# Patient Record
Sex: Female | Born: 1935 | Race: White | Hispanic: No | Smoking: Former smoker
Health system: Southern US, Community
[De-identification: ages and names within clinical notes are randomized; demographics above are authoritative.]

## PROBLEM LIST (undated history)

## (undated) DIAGNOSIS — K284 Chronic or unspecified gastrojejunal ulcer with hemorrhage: Secondary | ICD-10-CM

## (undated) DIAGNOSIS — I10 Essential (primary) hypertension: Secondary | ICD-10-CM

## (undated) DIAGNOSIS — K274 Chronic or unspecified peptic ulcer, site unspecified, with hemorrhage: Secondary | ICD-10-CM

## (undated) DIAGNOSIS — R51 Headache: Secondary | ICD-10-CM

## (undated) DIAGNOSIS — J189 Pneumonia, unspecified organism: Secondary | ICD-10-CM

## (undated) DIAGNOSIS — J45909 Unspecified asthma, uncomplicated: Secondary | ICD-10-CM

## (undated) DIAGNOSIS — Z9289 Personal history of other medical treatment: Secondary | ICD-10-CM

## (undated) DIAGNOSIS — G43909 Migraine, unspecified, not intractable, without status migrainosus: Secondary | ICD-10-CM

## (undated) DIAGNOSIS — K219 Gastro-esophageal reflux disease without esophagitis: Secondary | ICD-10-CM

## (undated) DIAGNOSIS — E119 Type 2 diabetes mellitus without complications: Secondary | ICD-10-CM

## (undated) DIAGNOSIS — M1990 Unspecified osteoarthritis, unspecified site: Secondary | ICD-10-CM

## (undated) DIAGNOSIS — E785 Hyperlipidemia, unspecified: Secondary | ICD-10-CM

## (undated) DIAGNOSIS — R519 Headache, unspecified: Secondary | ICD-10-CM

## (undated) DIAGNOSIS — Z8719 Personal history of other diseases of the digestive system: Secondary | ICD-10-CM

## (undated) HISTORY — PX: CATARACT EXTRACTION: SUR2

## (undated) HISTORY — PX: JOINT REPLACEMENT: SHX530

## (undated) HISTORY — PX: DILATION AND CURETTAGE OF UTERUS: SHX78

## (undated) HISTORY — PX: TOTAL KNEE ARTHROPLASTY: SHX125

---

## 1998-02-08 ENCOUNTER — Other Ambulatory Visit: Admission: RE | Admit: 1998-02-08 | Discharge: 1998-02-08 | Payer: Self-pay | Admitting: Family Medicine

## 2000-02-16 ENCOUNTER — Encounter: Payer: Self-pay | Admitting: Family Medicine

## 2000-02-16 ENCOUNTER — Encounter: Admission: RE | Admit: 2000-02-16 | Discharge: 2000-02-16 | Payer: Self-pay | Admitting: Family Medicine

## 2000-09-30 ENCOUNTER — Encounter: Payer: Self-pay | Admitting: Specialist

## 2000-10-08 ENCOUNTER — Inpatient Hospital Stay (HOSPITAL_COMMUNITY): Admission: RE | Admit: 2000-10-08 | Discharge: 2000-10-13 | Payer: Self-pay | Admitting: Specialist

## 2000-11-02 ENCOUNTER — Encounter: Admission: RE | Admit: 2000-11-02 | Discharge: 2000-11-26 | Payer: Self-pay | Admitting: Specialist

## 2001-02-21 ENCOUNTER — Encounter: Admission: RE | Admit: 2001-02-21 | Discharge: 2001-02-21 | Payer: Self-pay | Admitting: Family Medicine

## 2001-02-21 ENCOUNTER — Encounter: Payer: Self-pay | Admitting: Family Medicine

## 2002-03-03 ENCOUNTER — Encounter: Admission: RE | Admit: 2002-03-03 | Discharge: 2002-03-03 | Payer: Self-pay | Admitting: Family Medicine

## 2002-03-03 ENCOUNTER — Encounter: Payer: Self-pay | Admitting: Family Medicine

## 2002-05-26 ENCOUNTER — Encounter: Admission: RE | Admit: 2002-05-26 | Discharge: 2002-05-26 | Payer: Self-pay | Admitting: Family Medicine

## 2002-05-26 ENCOUNTER — Encounter: Payer: Self-pay | Admitting: Family Medicine

## 2003-03-06 ENCOUNTER — Encounter: Payer: Self-pay | Admitting: Family Medicine

## 2003-03-06 ENCOUNTER — Encounter: Admission: RE | Admit: 2003-03-06 | Discharge: 2003-03-06 | Payer: Self-pay | Admitting: Family Medicine

## 2003-09-27 ENCOUNTER — Inpatient Hospital Stay (HOSPITAL_COMMUNITY): Admission: EM | Admit: 2003-09-27 | Discharge: 2003-09-30 | Payer: Self-pay | Admitting: Emergency Medicine

## 2003-09-28 ENCOUNTER — Encounter (INDEPENDENT_AMBULATORY_CARE_PROVIDER_SITE_OTHER): Payer: Self-pay | Admitting: Specialist

## 2004-01-05 ENCOUNTER — Emergency Department (HOSPITAL_COMMUNITY): Admission: EM | Admit: 2004-01-05 | Discharge: 2004-01-05 | Payer: Self-pay | Admitting: Emergency Medicine

## 2004-02-15 ENCOUNTER — Inpatient Hospital Stay (HOSPITAL_COMMUNITY): Admission: RE | Admit: 2004-02-15 | Discharge: 2004-02-20 | Payer: Self-pay | Admitting: Specialist

## 2004-03-10 ENCOUNTER — Encounter
Admission: RE | Admit: 2004-03-10 | Discharge: 2004-06-02 | Payer: Self-pay | Admitting: Physical Medicine & Rehabilitation

## 2004-04-25 ENCOUNTER — Ambulatory Visit (HOSPITAL_COMMUNITY): Admission: RE | Admit: 2004-04-25 | Discharge: 2004-04-25 | Payer: Self-pay | Admitting: Family Medicine

## 2004-10-15 ENCOUNTER — Ambulatory Visit: Payer: Self-pay | Admitting: *Deleted

## 2005-03-24 ENCOUNTER — Emergency Department (HOSPITAL_COMMUNITY): Admission: EM | Admit: 2005-03-24 | Discharge: 2005-03-25 | Payer: Self-pay | Admitting: Emergency Medicine

## 2005-03-25 ENCOUNTER — Ambulatory Visit (HOSPITAL_COMMUNITY): Admission: RE | Admit: 2005-03-25 | Discharge: 2005-03-25 | Payer: Self-pay | Admitting: Orthopedic Surgery

## 2005-04-02 ENCOUNTER — Encounter: Admission: RE | Admit: 2005-04-02 | Discharge: 2005-04-02 | Payer: Self-pay | Admitting: Orthopedic Surgery

## 2005-04-17 ENCOUNTER — Encounter: Admission: RE | Admit: 2005-04-17 | Discharge: 2005-04-17 | Payer: Self-pay | Admitting: Orthopedic Surgery

## 2005-04-28 ENCOUNTER — Ambulatory Visit (HOSPITAL_COMMUNITY): Admission: RE | Admit: 2005-04-28 | Discharge: 2005-04-28 | Payer: Self-pay | Admitting: Family Medicine

## 2006-04-29 ENCOUNTER — Ambulatory Visit (HOSPITAL_COMMUNITY): Admission: RE | Admit: 2006-04-29 | Discharge: 2006-04-29 | Payer: Self-pay | Admitting: Family Medicine

## 2007-05-03 ENCOUNTER — Encounter: Admission: RE | Admit: 2007-05-03 | Discharge: 2007-05-03 | Payer: Self-pay | Admitting: Family Medicine

## 2008-05-03 ENCOUNTER — Encounter: Admission: RE | Admit: 2008-05-03 | Discharge: 2008-05-03 | Payer: Self-pay | Admitting: Family Medicine

## 2009-05-06 ENCOUNTER — Encounter: Admission: RE | Admit: 2009-05-06 | Discharge: 2009-05-06 | Payer: Self-pay | Admitting: Family Medicine

## 2010-05-07 ENCOUNTER — Encounter: Admission: RE | Admit: 2010-05-07 | Discharge: 2010-05-07 | Payer: Self-pay | Admitting: Family Medicine

## 2010-06-12 ENCOUNTER — Inpatient Hospital Stay (HOSPITAL_COMMUNITY)
Admission: EM | Admit: 2010-06-12 | Discharge: 2010-06-17 | Payer: Self-pay | Source: Home / Self Care | Admitting: Emergency Medicine

## 2010-08-10 ENCOUNTER — Encounter: Payer: Self-pay | Admitting: Orthopedic Surgery

## 2010-09-30 LAB — CBC
HCT: 33.3 % — ABNORMAL LOW (ref 36.0–46.0)
Hemoglobin: 11.3 g/dL — ABNORMAL LOW (ref 12.0–15.0)
Hemoglobin: 8.9 g/dL — ABNORMAL LOW (ref 12.0–15.0)
MCH: 32.6 pg (ref 26.0–34.0)
MCHC: 34.5 g/dL (ref 30.0–36.0)
MCV: 96.7 fL (ref 78.0–100.0)
Platelets: 247 10*3/uL (ref 150–400)
RBC: 3.03 MIL/uL — ABNORMAL LOW (ref 3.87–5.11)
RBC: 3.45 MIL/uL — ABNORMAL LOW (ref 3.87–5.11)
RDW: 12.6 % (ref 11.5–15.5)
RDW: 12.9 % (ref 11.5–15.5)
WBC: 8 10*3/uL (ref 4.0–10.5)
WBC: 8.5 10*3/uL (ref 4.0–10.5)

## 2010-09-30 LAB — DIFFERENTIAL
Basophils Absolute: 0 10*3/uL (ref 0.0–0.1)
Basophils Relative: 0 % (ref 0–1)
Monocytes Absolute: 0.9 10*3/uL (ref 0.1–1.0)
Neutro Abs: 6.2 10*3/uL (ref 1.7–7.7)
Neutrophils Relative %: 73 % (ref 43–77)

## 2010-09-30 LAB — URINE CULTURE
Colony Count: >=100000
Culture  Setup Time: 201111241402

## 2010-09-30 LAB — BASIC METABOLIC PANEL
BUN: 14 mg/dL (ref 6–23)
Calcium: 10 mg/dL (ref 8.4–10.5)
Creatinine, Ser: 1.11 mg/dL (ref 0.4–1.2)
GFR calc Af Amer: 57 mL/min — ABNORMAL LOW (ref >60)
GFR calc non Af Amer: 47 mL/min — ABNORMAL LOW (ref >60)

## 2010-09-30 LAB — COMPREHENSIVE METABOLIC PANEL
Albumin: 3.9 g/dL (ref 3.5–5.2)
Alkaline Phosphatase: 106 U/L (ref 39–117)
BUN: 18 mg/dL (ref 6–23)
CO2: 22 mEq/L (ref 19–32)
Chloride: 106 mEq/L (ref 96–112)
GFR calc non Af Amer: 44 mL/min — ABNORMAL LOW (ref >60)
Glucose, Bld: 107 mg/dL — ABNORMAL HIGH (ref 70–99)
Potassium: 4.6 mEq/L (ref 3.5–5.1)
Total Bilirubin: 0.7 mg/dL (ref 0.3–1.2)

## 2010-09-30 LAB — GLUCOSE, CAPILLARY
Glucose-Capillary: 114 mg/dL — ABNORMAL HIGH (ref 70–99)
Glucose-Capillary: 128 mg/dL — ABNORMAL HIGH (ref 70–99)
Glucose-Capillary: 138 mg/dL — ABNORMAL HIGH (ref 70–99)
Glucose-Capillary: 145 mg/dL — ABNORMAL HIGH (ref 70–99)
Glucose-Capillary: 146 mg/dL — ABNORMAL HIGH (ref 70–99)
Glucose-Capillary: 205 mg/dL — ABNORMAL HIGH (ref 70–99)
Glucose-Capillary: 60 mg/dL — ABNORMAL LOW (ref 70–99)
Glucose-Capillary: 60 mg/dL — ABNORMAL LOW (ref 70–99)
Glucose-Capillary: 67 mg/dL — ABNORMAL LOW (ref 70–99)
Glucose-Capillary: 69 mg/dL — ABNORMAL LOW (ref 70–99)
Glucose-Capillary: 77 mg/dL (ref 70–99)
Glucose-Capillary: 80 mg/dL (ref 70–99)
Glucose-Capillary: 86 mg/dL (ref 70–99)
Glucose-Capillary: 88 mg/dL (ref 70–99)
Glucose-Capillary: 95 mg/dL (ref 70–99)
Glucose-Capillary: 99 mg/dL (ref 70–99)

## 2010-09-30 LAB — URINALYSIS, ROUTINE W REFLEX MICROSCOPIC
Hgb urine dipstick: NEGATIVE
Protein, ur: NEGATIVE mg/dL
Urobilinogen, UA: 0.2 mg/dL (ref 0.0–1.0)

## 2010-09-30 LAB — HEMOGLOBIN A1C
Hgb A1c MFr Bld: 5.2 % (ref <5.7)
Mean Plasma Glucose: 103 mg/dL (ref <117)

## 2010-09-30 LAB — URINE MICROSCOPIC-ADD ON

## 2010-12-05 NOTE — Discharge Summary (Signed)
Wendy Vasquez, Wendy Vasquez                            ACCOUNT NO.:  [id number]   MEDICAL RECORD NO.:  [id number]                   PATIENT TYPE:  INP   LOCATION:  0460                                 FACILITY:  [facility]   PHYSICIAN:  Karlene Einstein, M.D.             DATE OF BIRTH:  08/16/1923   DATE OF ADMISSION:  09/27/2003  DATE OF DISCHARGE:  09/30/2003                                 DISCHARGE SUMMARY   PRIMARY CARE PHYSICIAN:  Marjory Lies, M.D., at [facility].   CARDIOLOGIST:  Charlies Constable, M.D.   GASTROENTEROLOGIST:  Althea Grimmer. Luther Parody, M.D.   DISCHARGE DIAGNOSES:  1. Gastrointestinal bleed/anemia secondary to gastric ulcers and chronic non-     steroidal anti-inflammatory drug use.  2. Hypertension.  3. Diabetes mellitus.  4. Hyperlipidemia.  5. Osteoarthritis.  6. History of peptic ulcer disease.  7. Asthma.  8. Hiatal hernia.  9. Status post left total knee replacement.  10.      History of right rotator cuff tear.  11.      Allergic rhinitis.   DISCHARGE MEDICATIONS:  1. Protonix 40 mg twice daily before meals.  2. Ferrous sulfate 325 mg twice daily with food.  3. Advair 100/50 one puff twice a day.  4. Altace 5 mg daily.  5. Tolazamide 250 mg daily.  6. Gemfibrozil 650 mg twice daily.  7. Claritin 10 mg daily.  8. Reglan 10 mg before meals and at bedtime.   CONSULTATIONS:  Dr. Roosvelt Harps, gastroenterology.   PROCEDURES:  EGD was done on September 28, 2003, results revealed three pyloric  channel ulcers likely related to NSAID use.  Recommendations were to  continue with Protonix.   LABORATORY DATA:  Discharge hemoglobin was 11.1, hematocrit 35.3.   HISTORY OF PRESENT ILLNESS:  This is a 77 year old female who was being  evaluated at her cardiologist's office, Dr. Juanda Chance, who performed a  Cardiolite stress test as well as an echocardiogram on September 27, 2003.  On  her routine pre-produce labs it was that the patient was anemic with a  hemoglobin of 6, and a hematocrit of 20.  She was sent to the [facility] ED  for further treatment and workup.  The patient does have a history of  bleeding ulcers in the past.  She also has been on chronic NSAID use for her  end-stage osteoarthritis.  She denied any alcohol use.  Upon questioning,  the patient noticed some increasing dyspnea on exertion as well as shortness  of breath for the last couple of months.  Upon questioning, she denied any  chest pain or abdominal pain.  She also denied any coffee ground or melena  or hematochezia.  The patient was admitted for further evaluation and  treatment.   HOSPITAL COURSE:  #1 -  GASTROINTESTINAL BLEED/ANEMIA SECONDARY TO GASTRIC  ULCERS:  She was admitted to a regular bed with the  diagnosis of an upper GI  bleed and anemia.  She was typed and crossed and transfused with 2 units of  packed red blood cell.  A gastroenterology consult was obtained.  She was  seen by Dr. Roosvelt Harps.  An EGD was done which revealed gastric ulcers.  Recommendations were to discontinue NSAIDS which were done, and to start a  proton pump inhibitor which was done also.  She was transfused again on  September 28, 2003, with 2 units of packed red blood cells.  Serial hemoglobins  improved, and her discharge hemoglobin was 11.1.  Discharge recommendations  by GI were to continue her proton pump inhibitor and her iron, and to follow  up with her primary care physician.  #2 -  HYPERTENSION:  On admission, her blood pressure medications were held  due to her severe anemia.  After transfusion, her blood pressure did come  up.  Her Altace was restarted at 5 mg daily.  On discharge, her blood  pressure is stable.  #3 -  DIABETES MELLITUS:  Stable.  The patient is to continue her same home  medications.  #4 -  HYPERLIPIDEMIA:  Her Gemfibrozil was held on admission.  The patient  is to restart her Gemfibrozil at discharge.  #5 -  OSTEOARTHRITIS:  The patient has been  instructed of no aspirin or  NSAID use.  She is to use Tylenol for now for her pain, and to follow up  with her primary care physician.   DISCHARGE INSTRUCTIONS AND FOLLOWUP:  1. Again, the patient has been advised not to use any aspirin or NSAIDS,     including ibuprofen or Advil.  She has been instructed only to use     Tylenol for now for pain.  2. She is to follow up with her primary care physician in two weeks for     followup and a CBC.     Stephanie Swaziland, NP                      Karlene Einstein, M.D.    SJ/MEDQ  D:  09/30/2003  T:  10/01/2003  Job:  [id number]   cc:   Marjory Lies, M.D.  [address]  [place]  [place] [address]  Fax: [phone]   Charlies Constable, M.D.   Althea Grimmer. Luther Parody, M.D.  [address]  [place]  [place] [address]  Fax: [phone]

## 2010-12-05 NOTE — H&P (Signed)
NAME:  Wendy Vasquez, Wendy Vasquez NO.:  [id number]   MEDICAL RECORD NO.:  [id number]                   PATIENT TYPE:   LOCATION:                                       FACILITY:  [facility]   PHYSICIAN:  Erasmo Leventhal, M.D.         DATE OF BIRTH:  [birth year]   DATE OF ADMISSION:  02/15/2004  DATE OF DISCHARGE:                                HISTORY & PHYSICAL   CHIEF COMPLAINT:  Right knee osteoarthritis.   HISTORY OF PRESENT ILLNESS:  This is a 77 year old lady with a history of  end-stage osteoarthritis of her right knee, who has previously undergone  left total knee arthroplasty and now due to failure and conservative  treatment to manage her symptoms of her right knee, wishes to proceed with  total knee arthroplasty of her right knee.  The surgery, benefits, and  aftercare were discussed in detail with the patient.  Questions were invited  and answered.  Surgery is to go ahead and scheduled.   DRUG ALLERGIES:  SULFA.   CURRENT MEDICATIONS:  1. Altace 5 mg 1 q.a.m.  2. Gemfibrozil 600 mg 1 b.i.d.  3. Metoclopramide 10 mg 1 before each meal and at bedtime.  4. Metformin 100 mg 1 q.a.m.  5. Prevacid 1 b.i.d.  6. She is supposed to take Advair 1 puff b.i.d., but she only uses it p.r.n.   PAST SURGICAL HISTORY:  Include left total knee arthroplasty.   PAST MEDICAL HISTORY:  Serious medical illnesses include hypertension,  asthma, diabetes, and a history of bleeding ulcers.   FAMILY HISTORY:  Positive for diabetes, cancer, coronary artery disease, and  hypertension.   SOCIAL HISTORY:  The patient is a widow.  She lives at home with her sister.  She does not smoke or drink.   REVIEW OF SYSTEMS:  Neuro system negative for headache, blurry vision, or  dizziness.  Pulmonary positive for exertional shortness of breath, otherwise  negative for PND and orthopnea.  Cardiovascular positive for occasional  irregular heartbeat that she has had for years.  No  chest pain,  palpitations.  GI positive for history of bleeding ulcers that were treated  back in April.  She required transfusion at that time.  She has had no  difficulty since then, but we will check with her GI doctor prior to her  surgery to make sure she is okay to undergo Coumadin therapy postoperative.  GI negative for urinary tract difficulty.  Musculoskeletal positive as in  HPI.   PHYSICAL EXAMINATION:  VITAL SIGNS:  BP 120/68, respirations 12, pulse 76  and regular.  GENERAL:  This is a well-developed and well-nourished lady in no acute  distress.  HEENT:  Head is normocephalic.  Nose patent.  Ears patent.  Pupils are  equal, round and reactive to light.  Throat without injection.  NECK:  Supple without adenopathy.  Carotids are 2+ without bruits.  LUNGS:  Chest is clear to auscultation.  No rales or rhonchi.  Respirations  are 12.  HEART:  Regular rate and rhythm at 76 beats per minute without murmur.  ABDOMEN:  Soft with active bowel sounds.  No masses or organomegaly.  EXTREMITIES:  The left knee is status post total knee replacement.  Right  knee shows a 25 degree flexion contracture with severe varus deformity and  further flexion to 110 degrees.  Dorsalis pedis and posterior tibial pulses  are 2+.  Sensation is intact.  NEUROLOGIC:  Patient is alert and oriented to time, place, and person.  Cranial nerves II-XII are grossly intact.   X-rays show end-stage osteoarthritis of the right knee.   IMPRESSION:  End-stage osteoarthritis of the right knee.   PLAN:  Total knee arthroplasty, right knee.     Jaquelyn Bitter. Chabon, P.A.                   Erasmo Leventhal, M.D.    SJC/MEDQ  D:  01/25/2004  T:  01/25/2004  Job:  [id number]

## 2010-12-05 NOTE — H&P (Signed)
NAME:  Wendy Vasquez, Wendy Vasquez                            ACCOUNT NO.:  [id number]   MEDICAL RECORD NO.:  [id number]                   PATIENT TYPE:  EMS   LOCATION:  ED                                   FACILITY:  [facility]   PHYSICIAN:  Emmit Alexanders, M.D.                      DATE OF BIRTH:  [birth year]   DATE OF ADMISSION:  09/27/2003  DATE OF DISCHARGE:                                HISTORY & PHYSICAL   CHIEF COMPLAINT:  Low blood levels.   HISTORY OF PRESENT ILLNESS:  This is a 77 year old white female who was seen  at her cardiologist's office, Dr. Juanda Chance, who performed a Cardiolite stress  test as well as an echocardiogram on her today.  On routine preprocedure  labs, she was found to have a hemoglobin of 6 and a hematocrit of 20. She  was sent to the ED for further treatment and workup. She does have a history  of bleeding ulcers in the past. She has chronic NSAID usage for end-stage  osteoarthritis. She denies alcohol use. This is in light of subacute picture  of increasing dyspnea on exertion as well as shortness of breath the last  couple of months.  Denies chest pain, denies abdominal pain currently. She  does admit to some vomiting and diarrhea on Sunday but denies seeing any  coffee grinds or melena or hematochezia. She did have a bowel movement since  then that was brown.   REVIEW OF SYMPTOMS:  Denies fever, cough, dysuria, hematuria.  The patient  does have some back pain.   PAST MEDICAL HISTORY:  1. History of peptic ulcer disease.  2. Diabetes type 2.  3. Hypertension.  4. Osteoarthritis.  5. Asthma.  6. Hiatal hernia.  7. She was status post left total knee replacement.  8. History of right rotator cuff tear.  9. Hypercholesterolemia.  10.      Allergic rhinitis.   MEDICATIONS:  1. Advair 100/50, one puff b.i.d.  2. Altace 5 mg q.d.  3. Tolazamide 250 mg q.d.  4. Gemfibrozil 600 mg b.i.d.  5. Claritin 10 mg q.d.  6. Reglan 10 mg q.a.c. and q.h.s.  7. Diclofenac 75 mg  b.i.d. p.r.n.   ALLERGIES:  The patient is allergic to SULFA which causes welts.   SOCIAL HISTORY:  The patient lives with her deaf sister. She is independent  of ADLs.  Denies tobacco or alcohol use. She is widowed.  Her daughter and  niece were present with her today.   FAMILY HISTORY:  The patient does have a strong history of peptic ulcer  disease.  Otherwise noncontributory.   PHYSICAL EXAMINATION:  VITAL SIGNS:  Temperature is 97.2, heart rate is 71,  blood pressure is 109/61, respiratory rate 22, oxygen saturation is 95% on  room air.  GENERAL:  This is a pleasant white female in no acute distress.  She is  alert and oriented x3.  HEENT:  Pupils equal round and reactive to light, extraocular movements  intact, there is no scleral icterus or injection. She has moist mucous  membranes. She has dentures. There is no oropharyngeal erythema. She does  have an overall pale appearance.  NECK:  She has 2+ carotid pulses. She has no lymph nodes.  LUNGS:  Clear to auscultation bilaterally.  She has good air movement.  No  wheezes or rhonchi were heard.  HEART:  She has a regular rate and rhythm without murmurs.  ABDOMEN:  Soft, nontender, nondistended with normal active bowel sounds. She  has no hepatomegaly.  She was guaiac positive per PA note.  EXTREMITIES:  The patient has no pedal edema. She has 1+ pedal pulses. She  moves all four extremities well.   LABORATORY DATA:  1. CBC, white blood cell count is 11.5, hemoglobin 5.9, hematocrit is 19.6,     platelet count is 597.  MCV of 65.1, RDW is 17.6.  2. Complete metabolic panel shows a sodium of 138, potassium of 4.1,     chloride of 108, CO2 of 22, BUN is 22, creatinine 1.3, glucose of 116,     calcium of 9, AST of 20, ALT of 9.  Alkaline phosphatase is 129, albumin     is 3.   ASSESSMENT/PLAN:  This is 77 year old white female with anemia presumably  secondary to upper GI bleed from chronic NSAID usage.   1. Anemia, upper  gastrointestinal bleed.  Dr. Luther Parody from     gastroenterology was consulted.  The patient seems to be stable and an     esophagogastroduodenoscopy will be performed in the morning.  Protonix     was started on her.  Will keep her n.p.o.  Obviously withhold her NSAIDs.     We checked H&H tonight as well as in the morning. Go ahead and transfuse     the 2 units of packed red blood cells as well. Will keep her on     maintenance IV fluids. We checked electrolytes in the morning.  2. Diabetes. The patient seems to be well controlled.  Will withhold oral     medications for now.  Keep her on a sensitive sliding scale q. 6h.  3. Hypertension.  Seems to be well controlled. Given anemia and fluids that     are being run, will withhold her Altace for now.  4. Hypercholesterolemia.  Will also withhold her gemfibrozil for now until     her acute issues are resolved.  5. Asthma.  Will go ahead and continue on her Advair one puff b.i.d. as well     as albuterol p.r.n.  6. FEN.  The patient will be kept n.p.o. until the procedure can be done.     Maintenance IV fluids will be run.  Electrolytes are stable for now. Will     recheck in the morning.                                               Emmit Alexanders, M.D.    DV/MEDQ  D:  09/27/2003  T:  09/27/2003  Job:  [id number]

## 2010-12-05 NOTE — Discharge Summary (Signed)
[facility]  Patient:    Wendy Vasquez, Wendy Vasquez                   MRN: [id number] Adm. Date:  [id number] Disc. Date: [id number] Attending:  Erasmo Leventhal Dictator:   Alexzandrew L. Perkins, P.A.-C.                           Discharge Summary  ADMITTING DIAGNOSES: 1. End stage osteoarthritis, bilateral knees, left more symptomatic than    right. 2. Hypertension. 3. Type 2 diabetes mellitus. 4. Hiatal hernia. 5. Gastroesophageal reflux disease. 6. Asthma.  DISCHARGE DIAGNOSES: 1. Left knee end stage osteoarthritis status post left total knee replacement    arthroplasty. 2. Right knee osteoarthritis. 3. Postoperative hemorrhagic anemia. 4. Hypertension. 5. Type 2 diabetes mellitus. 6. Hiatal hernia. 7. Gastroesophageal reflux disease. 8. Asthma.  PROCEDURE:  Patient was taken to the OR on October 08, 2000, underwent a left total knee replacement arthroplasty.  Surgeon - Dr. Benny Lennert. Assistant - Ralene Bathe, P.A.-C.  Surgery done under epidural.  Two Hemovac drains were placed at the time of surgery.  Tourniquet time of 90 minutes at 375 mmHg.  CONSULTS:  Rehabilitation services, Dr. Johna Roles.  HISTORY OF PRESENT ILLNESS:  Patient is a 77 year old female with a longstanding history of bilateral knee pain and dysfunction.  Over the past several months, her left knee has become more symptomatic.  She has had difficulty ambulating and she is losing her independence.  She was seen in the office where x-rays revealed severe osteoarthritis with complete bone-on-bone contact and subchondral sclerosis.  It is felt she has reached a point where she would benefit from undergoing knee replacement.  Risks and benefits were discussed and she has elected to proceed with surgery.  LABORATORY DATA:  CBC on admission showed a hemoglobin of 12.4, hematocrit 35.9, white cell count 5.7, red cell count 3.9.  Serial H&Hs were followed throughout the  hospital course.  Hemoglobin dropped postoperatively down to a level of 10.1 with a hematocrit of 30.7.  Hemoglobin was followed very closely on a daily basis, continued to drop down to 8.5 and 25.4.  Transfusion was discussed once her blood level reached down to a level of 7.9.  Patient did not want a transfusion.  Her hemoglobin was last checked at 7.8 and 23.3. Please note she was hemodynamically stable, asymptomatic, and was discharged to home with known anemia.  PT, PTT on admission were 13.7 and and 29 respectively.  Serum pro times were followed per Coumadin protocol.  Last noted PT-INR 19.1 and 2.0 respectively.  Chemistry panel on admission: Elevated glucose of 159.  She is a known diabetic.  Remaining chemistry panel all within normal limits.  Follow-up BMET showed a drop in sodium from 144 to 134, arising glucose from 159 to 171.  Remaining BMET all within normal limits.  Urinalysis on admission negative.  Blood group type O positive.  Left knee films:  Extensive osteoarthritis especially involving the medial portion of the left knee.  I do not see a chest x-ray or EKG on this chart.  HOSPITAL COURSE:  Patient was admitted to [facility] on October 06, 2000, taken to the OR, underwent the above-stated procedure without complication.  Patient had an epidural spinal anesthesia.  Therefore, her Coumadin was not initiated until postoperative day #2 when the epidural was pulled.  She was also placed on p.o. analgesics following the  epidural removal.  Physical therapy and occupational therapy were consulted to assist with gait training, ambulation.  Patient did fairly well; however, she had a slow start with PT only ambulating approximately 5 feet by postoperative day #2.  She did increase up to 100 feet by postoperative day #3 and was walking greater than 150 feet prior to discharge.  Due to her slow progress initially, rehab services were consulted.  Patient was seen and  evaluated by Dr. Johna Roles.  It was felt that she could benefit from home health PT, that it was doubtful that she would require inpatient stay.  Discharge planning started to arrange for home health.  it was noted that her hemoglobin was followed very closely and dropped, continued to drift down following surgery down to a level of 8.5 and, then, again at 7.9.  On postoperative day #4, transfusion was discussed.  Patient did not want transfusion.  She elected not to receive blood.  Her hemoglobin was again checked on October 13, 2000, and essentially unchanged with a hemoglobin of 7.8.  She had a resting pulse of 80 and stable pressure.  She was asymptomatic at that time.  When she was weaned over to p.o. analgesics and once she progressed well with her physical therapy, it was decided that she would be discharged to home.  Dressing changes were initiated on postoperative day #2.  Wound was healing well.  By postoperative day #5, she was doing quite well and was discharged home.  DISCHARGE MEDICATIONS/PLAN: 1. Patient was discharged home on October 13, 2000. 2. Discharge diagnoses:  Please see above. 3. Discharge medications:  She is placed on Trinsicon t.i.d., Robaxin p.r.n.    spasm, Percocet p.r.n. pain, and also Coumadin. 4. Follow up in two weeks from surgery. 5. Diet:  Low-sodium, diabetic diet. 6. Activity:  Home health physical therapy, home health nursing for gait    training, ambulation, total knee protocol.  Weightbearing as tolerated.  DISPOSITION:  Home.  CONDITION ON DISCHARGE:  Orthopedically improved.  Again, she is noted to be hemodynamically stable with a hemoglobin of 7.8 at time of discharge. DD:  11/17/00 TD:  11/17/00 Job: [id number]

## 2010-12-05 NOTE — Consult Note (Signed)
NAME:  [name], [name]                            ACCOUNT NO.:  [id number]   MEDICAL RECORD NO.:  [id number]                   PATIENT TYPE:  EMS   LOCATION:  ED                                   FACILITY:  [facility]   PHYSICIAN:  Althea Grimmer. Luther Parody, M.D.            DATE OF BIRTH:  06/26/1924   DATE OF CONSULTATION:  09/27/2003  DATE OF DISCHARGE:                                   CONSULTATION   HISTORY:  Wendy Vasquez is a 77 year old female who was referred to the  emergency room from Dr. Regino Schultze cardiology office after being evaluated for  dyspnea and found to have a hemoglobin of 6 which was confirmed at 5.9 in  the emergency room.  She has a history of bleeding ulcers in the past as  well as reflux and apparently a history of a small colonic polyp; however,  she says that she had a colonoscopy and upper endoscopy by Dr. Randa Evens two  years ago that did not reveal any recurrent polyps or any worrisome GI  pathology. She has been taking diclofenac 75 mg per day for arthritis.  She  had some vomiting on Sunday which she says was yellowish. She told one other  interviewer that there were some coffee grounds. She also had dark diarrhea  but no gross hematochezia in the emergency room and today she has not passed  any blood or vomited at all.  Her MCV is 65.1, BUN 22, creatinine 1.3.  She  has no lower abdominal discomfort.   PAST MEDICAL HISTORY:  Pertinent for reflux, hiatal hernia, degenerative  joint disease, status post total right knee replacement, diabetes mellitus  type 2 and hypertension.   CURRENT MEDICATIONS:  1. Advair.  2. Altace 5 mg q.d.  3. Gemfibrozil 600 mg b.i.d.  4. Tolazamide 250 mg q.d.  5. Claritin 10 mg q.d.  6. Reglan 10 mg a.c. and h.s.   ALLERGIES:  SULFA.   FAMILY HISTORY:  Noncontributory.   SOCIAL HISTORY:  She lives with her deaf sister who she assists with  activities of daily living. She does not smoke or drink.   REVIEW OF SYMPTOMS:  GENERAL:  No  weight loss or night sweats.  ENDOCRINE:  Positive for diabetes type 2.  SKIN:  No rash or pruritus.  EYES:  No  icterus or change in vision.  ENT:  No aphthous ulcers or chronic sore  throat. RESPIRATORY:  Positive for exertional dyspnea.  CARDIAC:  No chest  pain.  Evaluation outlined in accompanying notes by Dr. Juanda Chance.  GI:  As  above.  Complaining only of some epigastric minor discomfort.  GU:  No  dysuria or hematuria.   PHYSICAL EXAMINATION:  GENERAL:  She is a well-developed, well-nourished,  adult female in no acute distress.  VITAL SIGNS:  Afebrile.  Blood pressure 109/61, heart rate 71.  SKIN:  Normal.  HEENT:  Eyes  anicteric.  Conjunctiva pale.  Oropharynx unremarkable.  NECK:  Supple without thyromegaly. There is no cervical or inguinal  adenopathy.  CHEST:  Sounds clear.  HEART:  Heart sounds regular rate and rhythm.  ABDOMEN:  Soft, active bowel sounds without mass. There is minimal  epigastric tenderness to deep palpation.  RECTAL:  Not repeated but was reported guaiac positive.  EXTREMITIES:  Did not reveal any edema.   LABORATORY DATA:  Laboratory tests as above.   IMPRESSION:  Elderly female with a probable slow upper GI bleed related to  nonsteroidals. She may have gastritis or ulcers.  I suspect this is not due  to a colonic etiology because she has no lower GI symptoms and she  reportedly had a negative colonoscopy two years ago. The reports of her  endoscopies will be reviewed in our office records tomorrow since they are  not available in the hospital computer system.   PLAN:  She will be kept n.p.o. and placed on a Protonix bolus followed by a  drip and transfused for endoscopy tomorrow at 8 a.m.  The procedure is  reviewed with the patient and her daughters in terms of technique,  preparation and risk of complications including bleeding and perforation.  They agreed to proceed.                                               Althea Grimmer. Luther Parody,  M.D.    PJS/MEDQ  D:  09/27/2003  T:  09/27/2003  Job:  [id number]   cc:   Lenon Curt. Chilton Si, M.D.  [address]  [place]  [place] [address]  Fax: [phone]   Llana Aliment. Malon Kindle., M.D.  [address]  [place]  [place] [address]  Fax: [phone]   Tammy R. Collins Scotland, M.D.  [address]  [place]  [place] [address]  Fax: [phone]

## 2010-12-05 NOTE — Discharge Summary (Signed)
NAME:  Wendy Vasquez, Wendy Vasquez NO.:  [id number]   MEDICAL RECORD NO.:  [id number]                   PATIENT TYPE:  INP   LOCATION:  0464                                 FACILITY:  [facility]   PHYSICIAN:  Erasmo Leventhal, M.D.         DATE OF BIRTH:  1923/12/08   DATE OF ADMISSION:  02/15/2004  DATE OF DISCHARGE:  02/20/2004                                 DISCHARGE SUMMARY   ADMITTING DIAGNOSIS:  Right-knee end-stage osteoarthritis.   DISCHARGE DIAGNOSIS:  Right-knee end-stage osteoarthritis.   OPERATION:  Total knee arthroplasty, right knee.   BRIEF HISTORY:  This is a 77 year old lady with a history of end-stage  osteoarthritis of her right knee who has previously undergone left total-  knee arthroplasty, and now due to failure of conservative management, is  scheduled for right total-knee arthroplasty.  Surgery risks, benefits and  after-care was discussed in detail with the patient, questions invited and  answered.   LABORATORY DATA:  Admission CBC within normal limits.  Hemoglobin and  hematocrit reached a low of 8.5 and 25.5 on the 3rd.  Blood gases on the  31st showed a pH of 7.47, CO2 32, O2 at 61.3.  PT/PTT within normal limits  on admission.  At discharge, PT was 17.1 with INR of 1.6.  Admission CMET  showed the glucose elevated at 107, calcium high at 11, alkaline phosphatase  high at 122.  She was mildly hyponatremic during admission with a low down  to 132.  Her glucose ran elevated through admission with a high of 174.  Total protein was slightly low at 5.9.  Albumin was slightly low at 2.5.  Total iron binding capacity and percent saturation and ferritin were all  within normal limits.  Urinalysis showed moderate leukocyte esterase with a  few epithelials and a few bacteria.   HOSPITAL COURSE:  Medical consultation was obtained with the [facility]  Hospitalists.  This was for management of her diabetes.  On the first  postoperative day, the  patient was tolerating the procedure well.  Vital  signs were stable.  She was afebrile.  Hemovac was discontinued without  difficulty.  Epidural was planned out the following day.  On the second  postoperative day, the patient is doing well.  Dressing was changed.  The  wound was benign.  Calves were negative.  Vital sign were stable.  Temperature to 100.4.  Foley was discontinued, and PCA was discontinued, and  she was switched to p.o. medications.  On the third postoperative day, she  was feeling okay.  Vital signs were stable.  She was afebrile.  Hemoglobin  and hematocrit were stable.  She was mildly hyponatremic.  Her PT was 24.2  with an INR of 3.0.  Lungs were clear.  Bowel sounds were active.  Heart  sounds were normal.  Calves were negative.  Wound was benign.  Note was made  to the  pharmacy to try to maintain an INR between 2 and 2.5.  Her diabetes  remained well controlled by the hospitalist service.  On the fourth  postoperative day, vital signs were stable.  PT/INR was back down to 17.8  with INR of 1.7.  Vital signs were stable.  She was afebrile.  Calves were  negative.  On the 5th postoperative day, in improved condition with vital  signs stable, afebrile.  Hemoglobin and hematocrit stable with benign wound.  Lungs were clear.  Bowel sounds active.  Calves negative.  She was  subsequently discharged home for follow up in the office.   DISCHARGE CONDITION:  Improved.   DISCHARGE MEDICATIONS:  1.  Percocet.  2.  Robaxin.  3.  Coumadin.  4.  Trinsicon.   FOLLOWUP:  1.  In the office with [place] in 10 days.  2.  In the office with the medical service in two weeks.  3.  She will be followed up on an outpatient basis with home health, home      PT, and will have them check her hemoglobin in the postoperative period.   DISCHARGE INSTRUCTIONS:  She is to do her home physical therapy and return  to see [place] in 10 days.     Jaquelyn Bitter. Chabon, P.A.                   Erasmo Leventhal, M.D.    SJC/MEDQ  D:  03/31/2004  T:  03/31/2004  Job:  [id number]

## 2010-12-05 NOTE — Op Note (Signed)
NAME:  Wendy Vasquez, Wendy Vasquez NO.:  [id number]   MEDICAL RECORD NO.:  [id number]                   PATIENT TYPE:  INP   LOCATION:  0001                                 FACILITY:  [facility]   PHYSICIAN:  Erasmo Leventhal, M.D.         DATE OF BIRTH:  09/15/1923   DATE OF PROCEDURE:  02/15/2004  DATE OF DISCHARGE:                                 OPERATIVE REPORT   PREOPERATIVE DIAGNOSIS:  Right knee severe end-stage osteoarthritis with  marked varus malalignment and flexion contracture.   POSTOPERATIVE DIAGNOSIS:  Right knee severe end-stage osteoarthritis with  marked varus malalignment and flexion contracture.   PROCEDURE:  Right total knee arthroplasty.   SURGEON:  Valma Cava, M.D.   ASSISTANT:  Jodene Nam, PA-C   ANESTHESIA:  Spinal epidural.   ESTIMATED BLOOD LOSS:  Less than 100 mL.   DRAINS:  Two mini Hemovac.   COMPLICATIONS:  None.   TOURNIQUET TIME:  1 hour and 40 minutes at 375 mmHg.   DISPOSITION:  To PACU stable.   OPERATIVE IMPLANTS:  Osteonics components.  Size 9 femur, size 7 tibia, 12  mm posterior stabilized flex tibial insert, 26 mm patella.   OPERATIVE DETAILS:  The patient was counseled in the holding area, and  correct site was identified.  IV was started; antibiotics were given.  Taken  to the operating room.  Spinal epidural was administered, Foley catheter  placed utilizing sterile technique by the OR circulating nurse.  All  extremities were well-padded.  The right knee was examined.  She had about a  15-20 flexion contracture.  She could flex to 120 degrees.  She was in  marked varus malalignment.  She was elevated, prepped with DuraPrep, and all  were draped in a sterile fashion, exsanguinated with an Esmarch, and  tourniquet was inflated to 375 mmHg.  A straight midline incision made  through the skin and subcutaneous tissue.  Medial and lateral soft tissue  flaps were developed at the appropriate level.   Medial parapatellar  arthrotomy was performed.  Proximal medial tibia soft tissue release was  done.  The patella was everted.  Knee was flexed.  End-stage arthritic  change, bone-against-bone in all compartments.  Marked defect in the medial  tibial plateau and also the associated medial femoral condyle.  A starter  hole made, and the cruciate ligaments were resected.  A starting hole made  in the distal femur until the irrigant effluent was clear.  Intramedullary  rod was gently placed.  We chose a 12 mm cut off the distal femur secondary  to her flexion contracture.  Distal femur was found to be a size 9.  Rotational marks were made, and the distal femur was cut to fit a size 9.  The medial tibial osteophytes were removed.  Medial and lateral menisci  removed.  Geniculate vessels were coagulated.  Posterior neurovascular  structures were protected throughout  the entire case.  The tibial eminence  was resected.  The proximal tibia was found to be a size 7.  Rotational  marks were made.  The intramedullary hole was made; step reamer was  utilized.  Canal was irrigated until the effluent was clear, and  intramedullary rod was gently placed.  Initially chose to take a 12 mm cut  based upon the lateral side which took [place] to the bottom of the defect on the  medial side except for a little bit of very medial and sclerotic bone.  The  posteromedial including all femoral osteophytes were removed under direct  visualization.  The femoral trochlea was prepared in standard fashion.  At  this time, a size 9 femur, a size 7 tibia with a 10 mm flex insert, we were  still tight in extension but fine in flexion and based upon that, the knee  was then flexed and another 10 mm taken off the proximal tibia.  Collateral  ligaments were protected.  At this time, a size 9 femur, size 7 tibia, 12 mm  polyethylene insert trial.  We had excellent range of motion and soft tissue  balance and alignment.  Rotational  marks were made, and the Delta keel was  performed in the standard fashion.  The patella was found to be appropriate  thickness.  It was reamed to the appropriate depth.  Locking holes were made  for a size 26 recessed patella.  The knee was then irrigated with pulsatile  lavage.  Utilizing modern cement technique, all components were cemented  into place.  Size 7 tibia, size 9 femur, 26 patella.  After we allowed the  cement to cure, we did 10 and 12 and 15 mm trial inserts and with the 12 mm  tibial trial insert flex type, we had excellent flexion and extension gaps,  balance with varus and valgus, and patellofemoral tracking was anatomic.  At  this time, the tibial trial was removed; excess cement was removed, and a  final 12 mm thick flex posterior stabilized tibial insert was implanted.  Bone wax was placed on exposed bony surfaces.  Again, knee was checked and  well-balanced.  Excellent range of motion.  The knee was irrigated with  antibiotic solution during the closure as was each layer of soft tissue.  Two mini Hemovac drains were placed, and sequential closure of layers was  done, __________ Vicryl, subcu Vicryl, skin closed with subcuticular  Monocryl suture.  Steri-Strips were applied, sterile compressive dressing.  Tourniquet was deflated.  She had normal circulation in the foot and ankle  at the end of the case.  There were no complications or problems.  She was  given another gram of Ancef intravenously.  The patient was then taken from  the operating room to PACU in stable condition.   To decrease surgical time and help with retraction throughout this entire  difficult procedure, Mr. Brett Canales Chabon's assistance was needed.                                               Erasmo Leventhal, M.D.    RAC/MEDQ  D:  02/15/2004  T:  02/15/2004  Job:  [id number]

## 2010-12-05 NOTE — Op Note (Signed)
[facility]  Patient:    Wendy Vasquez, Wendy Vasquez                   MRN: [id number] Proc. Date: 10/08/00 Adm. Date:  [id number] Attending:  Erasmo Leventhal                           Operative Report  PREOPERATIVE DIAGNOSIS:  Left knee end-stage osteoarthritis.  POSTOPERATIVE DIAGNOSIS:  Left knee end-stage osteoarthritis.  PROCEDURE:  Left total knee arthroplasty.  SURGEON:  Daniel L. Thomasena Edis, M.D.  ASSISTANT:  French Ana Shuford, P.A.-C.  ANESTHESIA:  Epidural.  ESTIMATED BLOOD LOSS:  Less than 50 cc.  DRAINS:  Two medium Hemovacs.  COMPLICATIONS:  None.  TOURNIQUET TIME:  Ninety minutes at 375 mmHg.  DISPOSITION:  PACU stable.  COMPONENTS:  Osteonics components, posterior stabilized, all cemented, size 9 femur and size 9 tibia, 10 mm polyethylene insert, and a 26 mm polyethylene patella.  DESCRIPTION OF PROCEDURE:  The patient was counseled in the holding area and _________ identified.  IV was started and antibiotics were given and chart reviewed.  Was taken to the OR where the epidural was administered.  At this point in time utilizing sterile technique, the Foley catheter was placed by the OR circulating nurse.  The left knee was examined.  A 12 degree flexion contracture with flexion at 90 degrees.  Elevated and prepped with DuraPrep and draped in a sterile fashion.  Exsanguinated with an Esmarch and inflated to 375 mmHg.  A midline incision was made through the skin and subcutaneous tissue.  Soft tissue flaps were developed and kept at the appropriate level and hemostasis obtained.  The medial parapatellar arthrotomy was performed.  The patella was everted.  Medial soft tissue release at the proximal tibia secondary to varus malalignment.  The knee was then flexed, bone-on-bone contact, and end-stage disease.  A cruciate ligaments were resected.  A starting hole was made in the distal femur.  The canal was irrigated and an  intermedullary rod was placed, and we chose a 5 degree valgus cut, took a 12 mm cut off the distal femur. The distal femur was found to be a size #9.  Rotational marks were made and a cutting block was applied.  The distal femur was cut to a fit size #9. Proximal tibial osteophytes were removed, medial and lateral menisci removed, and geniculates were coagulated.  The proximal tibia was found to be a size #9.  A starting hole was made.  Step reaming was utilized and the canal was irrigated.  The intermedullary rod was gently placed.  Choosing a 5 degree posterior slope and took initially a 10 mm cut off the proximal tibia, and then went back later and took two more millimeters off the proximal tibia with the 5 degree posterior slope after _______ rotational marks.  Posteromedial and posterolateral femoral osteophytes were removed.  There were quite a few loose bodies which were removed from posterior.  The femoral trochlea was prepared in the standard fashion.  At this point in time, we sized a #9 trial of femur and tibia with a 10 mm polyethylene insert with excellent range of motion and soft tissue balance.  Delta keel was performed in the standard fashion.  The patella was addressed and she was found to be 23 mm in thickness.  It was reamed to a depth of 10 mm.  Locking holes were made and excess bone was  removed for a central recessed patella.  At this point in time, utilizing a modern cement technique, all components were cemented into place.  A size #9 tibia, a size #9 femur, and 26 mm patella.  After the cement had cured with a 10 mm polyethylene insert trial, we had excellent range of motion and soft tissue balance.  The knee was flexed, it was removed, and a 10 mm final component was placed in.  Bone wax was placed in the intercondylar notch region.  Patellar tracking was tilting the lateral ligament, but not subluxing.  Lateral release was performed given anatomic  patellofemoral tracking.  At this point in time, she had excellent alignment, rotation, and soft tissue balance.  Two medium Hemovac drains were placed.  The wounds were irrigated several times during closure with antibiotic solution.  The synovium was closed with Vicryl, arthrotomy with Vicryl.  Sponges were placed and the tourniquet was deflated after wrapping the knee with an Ace wrap.  Hemostasis was then obtained after allowing the knee to coagulate for a few minutes.  After obtaining hemostasis, the subcutaneous was closed with Vicryl, the skin closed with subcuticular Monocryl sutures.  Benzoin and Steri-Strips applied. A sterile dressing was applied.  Drains were then hooked to suction.  She was given another gram of Ancef intravenously and the tourniquet deflated.  She tolerated the procedure well and there were no complications.  Sponge and needle count were correct.  She was then gently taken from the operating room to PACU in stable condition. DD:  10/08/00 TD:  10/11/00 Job: [id number]

## 2010-12-05 NOTE — H&P (Signed)
[facility]  Patient:    Wendy Vasquez, Wendy Vasquez                           MRN: [id number] Adm. Date:  10/08/00 Attending:  R. Valma Cava, M.D. Dictator:   Della Goo, P.A. CC:         Wendy Vasquez, M.D. of [facility]   History and Physical  CHIEF COMPLAINT:  Left knee pain.  HISTORY OF PRESENT ILLNESS:  [name] is a very pleasant, 77 year old white female with longstanding history of bilateral knee pain and dysfunction. Over the past several months, the left knee has become more symptomatic, and she is having difficulty ambulating secondary to the left knee pain. She has usually been very independent and is losing her level of independence secondary to the left knee pain. X-rays have revealed varus osteoarthritis with complete bone-on-bone contact, subchondral sclerosis, and basically end-stage osteoarthritis. Examination reveals varus deformity of bilateral knees. Range of motion is 5 to 110 degrees on the right. She has +2 crepitation with range of motion and tenderness bilaterally at the joint line. She has previously undergone a left knee arthroscopy several years ago. She has had no other injury or surgeries to the left knee. At this time, after risks and benefits have been discussed with the patient and family, she will be admitted to the hospital to undergo a left total knee arthroplasty.  CURRENT MEDICATIONS: 1. Tolazamide 250 mg 1/2 tablet daily. 2. Altace 5 mg one tablet daily. 3. Diclofenac 75 mg one p.o. b.i.d. The patient will stop her diclofenac 1    week prior to surgery. 4. Os-Cal 500 daily. 5. Vitamin E 400 mg daily. 6. Protonix 40 mg once daily. 7. Advair b.i.d.  ALLERGIES:  SULFA DRUGS cause rash.  PAST MEDICAL HISTORY:  The patients family medical doctor is Wendy Vasquez, M.D. of [facility]. The patient has history of type 2 diabetes mellitus, hypertension, hiatal hernia, gastroesophageal  reflux disease, and asthma. She has also suffered a peptic ulcer in the past. The patient has been diagnosed with a rotator cuff tear of the right shoulder, as well.  PAST SURGICAL HISTORY:  Significant for left arthroscopy several years ago. The patient states she has also had a few moles removed in the past. All of which were benign.  SOCIAL HISTORY:  The patient lives at her home with a sister who is deaf and blind. The patient is the primary caregiver for her sister. She has other family members who also assist her when necessary at home. Her daughter, Wendy Vasquez, is very involved in her care. The patient has no intake of tobacco or alcohol. She is usually independent with activities of daily living and ambulates without assistance. The patient is a widow. The patient still drives her own automobile.  FAMILY HISTORY:  Patients mother had diabetes, high blood pressure, and colon cancer. She is deceased. Patients father had emphysema and also required a leg amputation secondary to gangrene. He is deceased as well. The patient has one brother that suffered lung cancer.  REVIEW OF SYSTEMS:  CNS:  The patient denies blurred vision, double vision, seizure disorder, headaches, or paralysis. She does wear corrective lenses at all times. CARDIORESPIRATORY:  No chest pain, shortness of breath, cough or sputum production, or hemoptysis. She recently had a cold that caused her to have chronic coughing. She uses Advair and has had no asthma symptoms while on  this medication. GI/GU:  Currently no nausea or vomiting, diarrhea or constipation. Wendy Vasquez, M.D. has recently placed her on Protonix to protect her stomach as she does have a history of peptic ulcer disease. She has no diarrhea, constipation. No dysuria, hematuria, incontinence, melena, or bloody stools. MUSCULOSKELETAL:  As per history of present illness and including osteoarthritis that involves both upper extremities. She has  a rotator cuff tear per MRI of the right shoulder. This is a chronic rotator cuff tear.  PHYSICAL EXAMINATION:  GENERAL:  She is a well-developed, well-nourished, white female alert and oriented x 4 in no acute distress. She is very pleasant and cooperative. Her daughter, Wendy Vasquez, is present for the entire history and physical.  VITAL SIGNS:  The patient is afebrile, blood pressure 118/78, pulse 64 and regular.  HEENT:  Normocephalic, atraumatic. Pupils equal, round, and reactive to light and accommodation. Extraocular movements intact. Nose without drainage. Oropharynx without edema or erythema.  NECK:  Supple. No adenopathy or thyromegaly.  LUNGS:  Crackles at bilateral bases. Examine otherwise normal.  HEART:  Regular rate and rhythm. No murmur heard.  ABDOMEN:  Soft, nontender. Bowel sounds are present.  GENITALIA/RECTAL/BREASTS:  Not performed. Not pertinent to present illness.  EXTREMITIES:  Examination of the left knee reveals varus deformity. Global tenderness throughout with crepitation on range of motion. No effusion is noted today. Distally in both lower extremities distal pulse is intact and sensation is intact. There is no clubbing, cyanosis, or edema.  IMPRESSION: 1. End-stage osteoarthritis bilateral knees, left more symptomatic than right. 2. Hypertension. 3. Type 2 diabetes mellitus. 4. Hiatal hernia. 5. Gastroesophageal reflux disease. 6. Asthma.  PLAN:  The patient will be admitted to the hospital to undergo left total knee arthroplasty. She has seen Wendy Vasquez, M.D. and felt clear for surgical intervention. The patient will have assistance from her family at home at discharge. It is not anticipated that she will require an inpatient rehab stay at this time. DD:  09/30/00 TD:  09/30/00 Job: [id number]

## 2011-03-30 ENCOUNTER — Other Ambulatory Visit: Payer: Self-pay | Admitting: Family Medicine

## 2011-03-30 DIAGNOSIS — Z1231 Encounter for screening mammogram for malignant neoplasm of breast: Secondary | ICD-10-CM

## 2011-05-12 ENCOUNTER — Ambulatory Visit: Payer: Self-pay

## 2013-11-09 ENCOUNTER — Encounter (HOSPITAL_COMMUNITY): Payer: Self-pay | Admitting: Emergency Medicine

## 2013-11-09 ENCOUNTER — Inpatient Hospital Stay (HOSPITAL_COMMUNITY)
Admission: EM | Admit: 2013-11-09 | Discharge: 2013-11-11 | DRG: 690 | Disposition: A | Discharge status: Home / Self Care | Payer: Medicare Other | Attending: Internal Medicine | Admitting: Internal Medicine

## 2013-11-09 ENCOUNTER — Emergency Department (HOSPITAL_COMMUNITY): Payer: Medicare Other

## 2013-11-09 ENCOUNTER — Observation Stay (HOSPITAL_COMMUNITY): Payer: Medicare Other

## 2013-11-09 DIAGNOSIS — Z7982 Long term (current) use of aspirin: Secondary | ICD-10-CM

## 2013-11-09 DIAGNOSIS — I10 Essential (primary) hypertension: Secondary | ICD-10-CM | POA: Diagnosis present

## 2013-11-09 DIAGNOSIS — D649 Anemia, unspecified: Secondary | ICD-10-CM | POA: Diagnosis present

## 2013-11-09 DIAGNOSIS — K5900 Constipation, unspecified: Secondary | ICD-10-CM | POA: Diagnosis present

## 2013-11-09 DIAGNOSIS — R55 Syncope and collapse: Secondary | ICD-10-CM | POA: Diagnosis present

## 2013-11-09 DIAGNOSIS — E860 Dehydration: Secondary | ICD-10-CM | POA: Diagnosis present

## 2013-11-09 DIAGNOSIS — M129 Arthropathy, unspecified: Secondary | ICD-10-CM | POA: Diagnosis present

## 2013-11-09 DIAGNOSIS — Z882 Allergy status to sulfonamides status: Secondary | ICD-10-CM

## 2013-11-09 DIAGNOSIS — Z79899 Other long term (current) drug therapy: Secondary | ICD-10-CM

## 2013-11-09 DIAGNOSIS — E119 Type 2 diabetes mellitus without complications: Secondary | ICD-10-CM | POA: Diagnosis present

## 2013-11-09 DIAGNOSIS — N390 Urinary tract infection, site not specified: Principal | ICD-10-CM | POA: Diagnosis present

## 2013-11-09 DIAGNOSIS — A498 Other bacterial infections of unspecified site: Secondary | ICD-10-CM | POA: Diagnosis present

## 2013-11-09 DIAGNOSIS — N179 Acute kidney failure, unspecified: Secondary | ICD-10-CM

## 2013-11-09 DIAGNOSIS — R109 Unspecified abdominal pain: Secondary | ICD-10-CM | POA: Diagnosis present

## 2013-11-09 DIAGNOSIS — J45909 Unspecified asthma, uncomplicated: Secondary | ICD-10-CM | POA: Diagnosis present

## 2013-11-09 DIAGNOSIS — Z66 Do not resuscitate: Secondary | ICD-10-CM | POA: Diagnosis present

## 2013-11-09 DIAGNOSIS — Z87891 Personal history of nicotine dependence: Secondary | ICD-10-CM

## 2013-11-09 DIAGNOSIS — E785 Hyperlipidemia, unspecified: Secondary | ICD-10-CM | POA: Diagnosis present

## 2013-11-09 HISTORY — DX: Essential (primary) hypertension: I10

## 2013-11-09 HISTORY — DX: Hyperlipidemia, unspecified: E78.5

## 2013-11-09 HISTORY — DX: Unspecified asthma, uncomplicated: J45.909

## 2013-11-09 HISTORY — DX: Type 2 diabetes mellitus without complications: E11.9

## 2013-11-09 LAB — BASIC METABOLIC PANEL
BUN: 22 mg/dL (ref 6–23)
CHLORIDE: 107 meq/L (ref 96–112)
CO2: 19 mEq/L (ref 19–32)
Calcium: 11.1 mg/dL — ABNORMAL HIGH (ref 8.4–10.5)
Creatinine, Ser: 0.92 mg/dL (ref 0.50–1.10)
GFR, EST AFRICAN AMERICAN: 63 mL/min — AB (ref >90)
GFR, EST NON AFRICAN AMERICAN: 54 mL/min — AB (ref >90)
Glucose, Bld: 151 mg/dL — ABNORMAL HIGH (ref 70–99)
POTASSIUM: 4.2 meq/L (ref 3.7–5.3)
SODIUM: 144 meq/L (ref 137–147)

## 2013-11-09 LAB — CBC WITH DIFFERENTIAL/PLATELET
BASOS ABS: 0 10*3/uL (ref 0.0–0.1)
BASOS PCT: 0 % (ref 0–1)
EOS ABS: 0 10*3/uL (ref 0.0–0.7)
Eosinophils Relative: 0 % (ref 0–5)
HCT: 32.8 % — ABNORMAL LOW (ref 36.0–46.0)
HEMOGLOBIN: 10.9 g/dL — AB (ref 12.0–15.0)
Lymphocytes Relative: 4 % — ABNORMAL LOW (ref 12–46)
Lymphs Abs: 0.5 10*3/uL — ABNORMAL LOW (ref 0.7–4.0)
MCH: 32.4 pg (ref 26.0–34.0)
MCHC: 33.2 g/dL (ref 30.0–36.0)
MCV: 97.6 fL (ref 78.0–100.0)
Monocytes Absolute: 1.1 10*3/uL — ABNORMAL HIGH (ref 0.1–1.0)
Monocytes Relative: 8 % (ref 3–12)
NEUTROS ABS: 12.4 10*3/uL — AB (ref 1.7–7.7)
NEUTROS PCT: 88 % — AB (ref 43–77)
PLATELETS: 264 10*3/uL (ref 150–400)
RBC: 3.36 MIL/uL — ABNORMAL LOW (ref 3.87–5.11)
RDW: 12.4 % (ref 11.5–15.5)
WBC: 14 10*3/uL — ABNORMAL HIGH (ref 4.0–10.5)

## 2013-11-09 LAB — TROPONIN I: Troponin I: <0.3 ng/mL (ref <0.30)

## 2013-11-09 LAB — URINALYSIS, ROUTINE W REFLEX MICROSCOPIC
BILIRUBIN URINE: NEGATIVE
Glucose, UA: NEGATIVE mg/dL
Ketones, ur: NEGATIVE mg/dL
NITRITE: NEGATIVE
PH: 5.5 (ref 5.0–8.0)
Protein, ur: NEGATIVE mg/dL
SPECIFIC GRAVITY, URINE: 1.017 (ref 1.005–1.030)
UROBILINOGEN UA: 0.2 mg/dL (ref 0.0–1.0)

## 2013-11-09 LAB — URINE MICROSCOPIC-ADD ON

## 2013-11-09 MED ORDER — ONDANSETRON HCL 4 MG/2ML IJ SOLN
4.0000 mg | Freq: Once | INTRAMUSCULAR | Status: AC
  Administered 2013-11-09: 4 mg via INTRAVENOUS
  Filled 2013-11-09: qty 2

## 2013-11-09 MED ORDER — ACETAMINOPHEN 325 MG PO TABS
650.0000 mg | ORAL_TABLET | Freq: Once | ORAL | Status: AC
  Administered 2013-11-09: 650 mg via ORAL
  Filled 2013-11-09: qty 2

## 2013-11-09 NOTE — ED Notes (Signed)
Bed: WA06 Expected date: 11/09/13 Expected time: 6:39 PM Means of arrival: Ambulance Comments: 80 yo F  Near syncope

## 2013-11-09 NOTE — ED Provider Notes (Signed)
CSN: [id number]     Arrival date & time 11/09/13  1911 History   First MD Initiated Contact with Patient 11/09/13 1912     Chief Complaint  Patient presents with  . Near Syncope     (Consider location/radiation/quality/duration/timing/severity/associated sxs/prior Treatment) HPI 80 year old female presents after a syncopal episode. She's been having constipation issues for several days and her daughter gave her milk of magnesia. The patient states that she needed to go to the bathroom earlier today. While she's a little she states she felt nauseous. She was not straining. She denies chest pain or shortness of breath. She then had very large diarrhea bowel movement. Shortly after this she states her nausea got worse and she appeared pale and seemed to pass out. After a minute or 2 she seemed better. Still denies shortness of breath or chest pain. No confusion per family members. No focal weakness but she is chronically diffusely weak. No headache or head injury. No vomiting. She does feel nauseous currently.  Past Medical History  Diagnosis Date  . Diabetes mellitus without complication    Past Surgical History  Procedure Laterality Date  . Joint replacement     No family history on file. History  Substance Use Topics  . Smoking status: Former Smoker    Types: Cigarettes  . Smokeless tobacco: Former NeurosurgeonUser  . Alcohol Use: No   OB History   Grav Para Term Preterm Abortions TAB SAB Ect Mult Living                 Review of Systems  Constitutional: Negative for fever.  Respiratory: Negative for shortness of breath.   Cardiovascular: Negative for chest pain.  Gastrointestinal: Positive for nausea, diarrhea and constipation. Negative for vomiting, abdominal pain (resolved with her diarrhea/BM) and blood in stool.  Genitourinary: Negative for dysuria.  Neurological: Positive for syncope. Negative for weakness.  Psychiatric/Behavioral: Negative for confusion.  All other systems  reviewed and are negative.     Allergies  Sulphur  Home Medications   Prior to Admission medications   Not on File   BP 123/52  Pulse 77  Temp(Src) 97.3 F (36.3 C) (Oral)  Resp 14  SpO2 96% Physical Exam  Nursing note and vitals reviewed. Constitutional: She is oriented to person, place, and time. She appears well-developed and well-nourished.  HENT:  Head: Normocephalic and atraumatic.  Right Ear: External ear normal.  Left Ear: External ear normal.  Nose: Nose normal.  Eyes: Right eye exhibits no discharge. Left eye exhibits no discharge.  Cardiovascular: Normal rate, regular rhythm and normal heart sounds.   Pulmonary/Chest: Effort normal and breath sounds normal. She has no wheezes. She has no rales.  Abdominal: Soft. She exhibits no distension. There is no tenderness.  Neurological: She is alert and oriented to person, place, and time.  Skin: Skin is warm and dry.    ED Course  Procedures (including critical care time) Labs Review Labs Reviewed  CBC WITH DIFFERENTIAL - Abnormal; Notable for the following:    WBC 14.0 (*)    RBC 3.36 (*)    Hemoglobin 10.9 (*)    HCT 32.8 (*)    Neutrophils Relative % 88 (*)    Neutro Abs 12.4 (*)    Lymphocytes Relative 4 (*)    Lymphs Abs 0.5 (*)    Monocytes Absolute 1.1 (*)    All other components within normal limits  BASIC METABOLIC PANEL - Abnormal; Notable for the following:    Glucose,  Bld 151 (*)    Calcium 11.1 (*)    GFR calc non Af Amer 54 (*)    GFR calc Af Amer 63 (*)    All other components within normal limits  TROPONIN I  URINALYSIS, ROUTINE W REFLEX MICROSCOPIC    Imaging Review No results found.   EKG Interpretation   Date/Time:  Thursday November 09 2013 20:25:27 EDT Ventricular Rate:  80 PR Interval:  61 QRS Duration: 101 QT Interval:  358 QTC Calculation: 413 R Axis:   49 Text Interpretation:  Sinus rhythm Short PR interval Right atrial  enlargement Nonspecific T abnormalities, lateral  leads Artifact in lead(s)  I II III aVR aVL aVF V1 V2 V3 V4 V5 V6 difficult to interpret due to  artifact Confirmed by Glema Takaki  MD, Omesha Bowerman ([id number]) on 11/09/2013 8:37:37 PM      MDM   Final diagnoses:  Syncope    80 year-old female with syncope while in the toilet. Her only symptom was nausea and lightheadedness. It could related to her large bowel movement, but that she still had nausea on arrival there is concern for cardiac pathology. Her troponin is negative. Her EKG initially is difficult to read due to significant artifact. Multiple EKGs were attempted and had similar results. It is her nausea is better she feels well, continues to have bowel movements. Given her age I feel it would be most appropriate to observe her overnight and cardiac monitoring for a syncope workup. After discussing with Dr. Toniann FailKakrakandy will get CT abd pelvis given her prior abdominal pain and admit to tele.    Audree CamelScott T Airrion Otting, MD 11/09/13 [phone]

## 2013-11-09 NOTE — ED Notes (Signed)
Pt brought by EMS from home, c/o of near syncope episode as she was using the bathroom, per family  report, pt has a recent change in mental status below her baseline. Pt in NAD, AOx2, only able to state her name.

## 2013-11-09 NOTE — ED Notes (Signed)
Pt's Daughter Babette RelicAMMY [phone] Cell- [phone]

## 2013-11-09 NOTE — ED Notes (Signed)
Pt had a very large bowel movement. Family member said that is the second bowel movement the Pt has had today of that size.Family member said she believes the Pt has been impacted for several days. She said, "She's been eating well and not getting rid of anything." BM was very solid.

## 2013-11-09 NOTE — ED Notes (Signed)
Per family member, Pt said her hip is hurting and she would like some pain medication. RN notified.

## 2013-11-10 ENCOUNTER — Encounter (HOSPITAL_COMMUNITY): Payer: Self-pay

## 2013-11-10 DIAGNOSIS — D649 Anemia, unspecified: Secondary | ICD-10-CM

## 2013-11-10 DIAGNOSIS — E785 Hyperlipidemia, unspecified: Secondary | ICD-10-CM | POA: Diagnosis present

## 2013-11-10 DIAGNOSIS — I10 Essential (primary) hypertension: Secondary | ICD-10-CM

## 2013-11-10 DIAGNOSIS — R55 Syncope and collapse: Secondary | ICD-10-CM

## 2013-11-10 LAB — BASIC METABOLIC PANEL
BUN: 22 mg/dL (ref 6–23)
CALCIUM: 10.7 mg/dL — AB (ref 8.4–10.5)
CO2: 23 meq/L (ref 19–32)
Chloride: 102 mEq/L (ref 96–112)
Creatinine, Ser: 1.22 mg/dL — ABNORMAL HIGH (ref 0.50–1.10)
GFR calc Af Amer: 44 mL/min — ABNORMAL LOW (ref >90)
GFR, EST NON AFRICAN AMERICAN: 38 mL/min — AB (ref >90)
Glucose, Bld: 111 mg/dL — ABNORMAL HIGH (ref 70–99)
Potassium: 3.8 mEq/L (ref 3.7–5.3)
Sodium: 138 mEq/L (ref 137–147)

## 2013-11-10 LAB — CBC WITH DIFFERENTIAL/PLATELET
Basophils Absolute: 0 10*3/uL (ref 0.0–0.1)
Basophils Relative: 0 % (ref 0–1)
EOS PCT: 0 % (ref 0–5)
Eosinophils Absolute: 0 10*3/uL (ref 0.0–0.7)
HCT: 31.5 % — ABNORMAL LOW (ref 36.0–46.0)
Hemoglobin: 10.4 g/dL — ABNORMAL LOW (ref 12.0–15.0)
Lymphocytes Relative: 7 % — ABNORMAL LOW (ref 12–46)
Lymphs Abs: 1.1 10*3/uL (ref 0.7–4.0)
MCH: 32 pg (ref 26.0–34.0)
MCHC: 33 g/dL (ref 30.0–36.0)
MCV: 96.9 fL (ref 78.0–100.0)
Monocytes Absolute: 0.7 10*3/uL (ref 0.1–1.0)
Monocytes Relative: 5 % (ref 3–12)
Neutro Abs: 12.6 10*3/uL — ABNORMAL HIGH (ref 1.7–7.7)
Neutrophils Relative %: 88 % — ABNORMAL HIGH (ref 43–77)
Platelets: 236 10*3/uL (ref 150–400)
RBC: 3.25 MIL/uL — ABNORMAL LOW (ref 3.87–5.11)
RDW: 12.5 % (ref 11.5–15.5)
WBC: 14.3 10*3/uL — ABNORMAL HIGH (ref 4.0–10.5)

## 2013-11-10 LAB — COMPREHENSIVE METABOLIC PANEL
ALBUMIN: 3.5 g/dL (ref 3.5–5.2)
ALK PHOS: 87 U/L (ref 39–117)
ALT: 7 U/L (ref 0–35)
AST: 21 U/L (ref 0–37)
BUN: 21 mg/dL (ref 6–23)
CHLORIDE: 102 meq/L (ref 96–112)
CO2: 20 meq/L (ref 19–32)
Calcium: 10.6 mg/dL — ABNORMAL HIGH (ref 8.4–10.5)
Creatinine, Ser: 0.96 mg/dL (ref 0.50–1.10)
GFR, EST AFRICAN AMERICAN: 59 mL/min — AB (ref >90)
GFR, EST NON AFRICAN AMERICAN: 51 mL/min — AB (ref >90)
GLUCOSE: 141 mg/dL — AB (ref 70–99)
POTASSIUM: 4.5 meq/L (ref 3.7–5.3)
Sodium: 135 mEq/L — ABNORMAL LOW (ref 137–147)
Total Bilirubin: 0.3 mg/dL (ref 0.3–1.2)
Total Protein: 6.5 g/dL (ref 6.0–8.3)

## 2013-11-10 LAB — TSH: TSH: 1.21 u[IU]/mL (ref 0.350–4.500)

## 2013-11-10 LAB — TROPONIN I: Troponin I: <0.3 ng/mL (ref <0.30)

## 2013-11-10 MED ORDER — CALCIUM CARBONATE-VITAMIN D 500-200 MG-UNIT PO TABS
1.0000 | ORAL_TABLET | Freq: Two times a day (BID) | ORAL | Status: DC
  Administered 2013-11-10 – 2013-11-11 (×4): 1 via ORAL
  Filled 2013-11-10 (×5): qty 1

## 2013-11-10 MED ORDER — ACETAMINOPHEN 650 MG RE SUPP: 650.0000 mg | Freq: Four times a day (QID) | RECTAL | Status: DC | PRN

## 2013-11-10 MED ORDER — SODIUM CHLORIDE 0.9 % IJ SOLN
3.0000 mL | Freq: Two times a day (BID) | INTRAMUSCULAR | Status: DC
  Administered 2013-11-10 – 2013-11-11 (×3): 3 mL via INTRAVENOUS

## 2013-11-10 MED ORDER — ONDANSETRON HCL 4 MG/2ML IJ SOLN: 4.0000 mg | Freq: Four times a day (QID) | INTRAMUSCULAR | Status: DC | PRN

## 2013-11-10 MED ORDER — IRBESARTAN 150 MG PO TABS
150.0000 mg | ORAL_TABLET | Freq: Every day | ORAL | Status: DC
  Administered 2013-11-10: 150 mg via ORAL
  Filled 2013-11-10 (×2): qty 1

## 2013-11-10 MED ORDER — ONDANSETRON HCL 4 MG PO TABS: 4.0000 mg | ORAL_TABLET | Freq: Four times a day (QID) | ORAL | Status: DC | PRN

## 2013-11-10 MED ORDER — MOMETASONE FURO-FORMOTEROL FUM 100-5 MCG/ACT IN AERO
2.0000 | INHALATION_SPRAY | Freq: Two times a day (BID) | RESPIRATORY_TRACT | Status: DC
  Administered 2013-11-10 – 2013-11-11 (×4): 2 via RESPIRATORY_TRACT
  Filled 2013-11-10: qty 8.8

## 2013-11-10 MED ORDER — ENSURE COMPLETE PO LIQD: 237.0000 mL | ORAL | Status: DC | PRN

## 2013-11-10 MED ORDER — ENOXAPARIN SODIUM 40 MG/0.4ML ~~LOC~~ SOLN
40.0000 mg | SUBCUTANEOUS | Status: DC
  Administered 2013-11-10 – 2013-11-11 (×2): 40 mg via SUBCUTANEOUS
  Filled 2013-11-10 (×2): qty 0.4

## 2013-11-10 MED ORDER — IOHEXOL 300 MG/ML  SOLN
100.0000 mL | Freq: Once | INTRAMUSCULAR | Status: AC | PRN
  Administered 2013-11-10: 100 mL via INTRAVENOUS

## 2013-11-10 MED ORDER — GEMFIBROZIL 600 MG PO TABS
600.0000 mg | ORAL_TABLET | Freq: Two times a day (BID) | ORAL | Status: DC
  Administered 2013-11-10 – 2013-11-11 (×4): 600 mg via ORAL
  Filled 2013-11-10 (×5): qty 1

## 2013-11-10 MED ORDER — DEXTROSE 5 % IV SOLN
1.0000 g | INTRAVENOUS | Status: DC
  Administered 2013-11-10 – 2013-11-11 (×2): 1 g via INTRAVENOUS
  Filled 2013-11-10 (×2): qty 10

## 2013-11-10 MED ORDER — ASPIRIN 81 MG PO CHEW
81.0000 mg | CHEWABLE_TABLET | Freq: Every day | ORAL | Status: DC
  Administered 2013-11-10 – 2013-11-11 (×2): 81 mg via ORAL
  Filled 2013-11-10 (×2): qty 1

## 2013-11-10 MED ORDER — VITAMIN B-12 1000 MCG PO TABS
1000.0000 ug | ORAL_TABLET | Freq: Every day | ORAL | Status: DC
  Administered 2013-11-10 – 2013-11-11 (×2): 1000 ug via ORAL
  Filled 2013-11-10 (×2): qty 1

## 2013-11-10 MED ORDER — ADULT MULTIVITAMIN W/MINERALS CH
1.0000 | ORAL_TABLET | Freq: Every day | ORAL | Status: DC
  Administered 2013-11-10 – 2013-11-11 (×2): 1 via ORAL
  Filled 2013-11-10 (×2): qty 1

## 2013-11-10 MED ORDER — SODIUM CHLORIDE 0.9 % IV SOLN
INTRAVENOUS | Status: AC
  Administered 2013-11-10: 02:00:00 via INTRAVENOUS

## 2013-11-10 MED ORDER — ACETAMINOPHEN 325 MG PO TABS
650.0000 mg | ORAL_TABLET | Freq: Four times a day (QID) | ORAL | Status: DC | PRN
  Administered 2013-11-10 (×3): 650 mg via ORAL
  Filled 2013-11-10 (×3): qty 2

## 2013-11-10 MED ORDER — PANTOPRAZOLE SODIUM 40 MG PO TBEC
40.0000 mg | DELAYED_RELEASE_TABLET | Freq: Every day | ORAL | Status: DC
  Administered 2013-11-10 – 2013-11-11 (×2): 40 mg via ORAL
  Filled 2013-11-10 (×2): qty 1

## 2013-11-10 MED ORDER — ALPRAZOLAM 0.5 MG PO TABS: 0.5000 mg | ORAL_TABLET | Freq: Every evening | ORAL | Status: DC | PRN

## 2013-11-10 NOTE — Progress Notes (Signed)
Echocardiogram 2D Echocardiogram has been performed.  Genene ChurnJames M Penelope Fittro 11/10/2013, 8:39 AM

## 2013-11-10 NOTE — Progress Notes (Signed)
Patient seen and examined this morning, agree with H&P.  Costin M. Gherghe, MD [facility] [phone] 

## 2013-11-10 NOTE — Progress Notes (Signed)
UR completed. Patient changed to inpatient- requiring IV antibiotics and IVF @ 75cc/hr  

## 2013-11-10 NOTE — Evaluation (Signed)
Physical Therapy Evaluation Patient Details Name: Wendy RamsRuth K Greff MRN: [id number] DOB: [birth year] Today's Date: 11/10/2013   History of Present Illness  Pt is an 80 y.o. female with history of hypertension, asthma, hyperlipidemia and bil TKRs admitted 4/23 after having syncope episode during BM at home.  Clinical Impression  Pt currently with functional limitations due to the deficits listed below (see PT Problem List).  Pt will benefit from skilled PT to increase their independence and safety with mobility to allow discharge to the venue listed below.  Pt unable to provide history and agreeable to call her daughter to discuss this.  Daughter reports pt requires assist for bed mobility and transfers however daughter and daughter's spouse ambulate with pt to each room in her home as well as outside to car for appointments.  She reports pt always has assist when mobilizing due to UE contractures and is agreeable to PT in hospital to continue mobilization for pt to be able to return home (daughter reports she does not want pt to go to SNF and wishes for pt to return home).       Follow Up Recommendations Home health PT    Equipment Recommendations  3in1 (PT)    Recommendations for Other Services       Precautions / Restrictions Precautions Precautions: Fall      Mobility  Bed Mobility Overal bed mobility: +2 for physical assistance;Needs Assistance Bed Mobility: Supine to Sit;Sit to Supine     Supine to sit: Total assist;+2 for physical assistance Sit to supine: Total assist;+2 for physical assistance   General bed mobility comments: verbal cues and time given to perform tasks however pt unable to assist except for slight assist with moving LEs one at a time to EOB  Transfers                 General transfer comment: NT due to pt reporting too weak and pt unable to sit upright without support  Ambulation/Gait                Stairs            Wheelchair  Mobility    Modified Rankin (Stroke Patients Only)       Balance Overall balance assessment: Needs assistance Sitting-balance support: No upper extremity supported;Feet supported Sitting balance-Leahy Scale: Zero Sitting balance - Comments: sat EOB for 3-4 minutes, pt unable to self support due to UE contractures and too weak to perform stabilization through trunk Postural control: Posterior lean                                   Pertinent Vitals/Pain Facial grimacing with touching lower legs in supine to remove pillow however none observed upon returning to supine.     Home Living Family/patient expects to be discharged to:: Private residence Living Arrangements: Children (daughter and dtr's spouse) Available Help at Discharge: Family;Available 24 hours/day Type of Home: House Home Access: Stairs to enter   Enter[employer] CorporationEntrance Stairs-Number of Steps: 3-4 Home Layout: One level Home Equipment: Wheelchair - manual;Transport chair;Bedside commode      Prior Function Level of Independence: Needs assistance   Gait / Transfers Assistance Needed: daughter reports pt always up with assistance, pt able to assist a little with bed mobility and transfers, able to ambulate with +2 assist for pt safety (unable to hold assistive device due to UE contractures)  ADL's / [employer] Assistance  Needed: total assist        Hand Dominance        Extremity/Trunk Assessment   Upper Extremity Assessment: RUE deficits/detail;LUE deficits/detail RUE Deficits / Details: flexion contractures     LUE Deficits / Details: flexion contractures   Lower Extremity Assessment: Generalized weakness         Communication   Communication:  (difficult to understand, whispers)  Cognition Arousal/Alertness: Awake/alert Behavior During Therapy: WFL for tasks assessed/performed Overall Cognitive Status: No family/caregiver present to determine baseline cognitive functioning (appears to be at  baseline per discussion on phone with daughter)                      General Comments      Exercises        Assessment/Plan    PT Assessment Patient needs continued PT services  PT Diagnosis Difficulty walking;Generalized weakness   PT Problem List Decreased mobility;Decreased strength;Decreased activity tolerance  PT Treatment Interventions DME instruction;Gait training;Functional mobility training;Therapeutic activities;Therapeutic exercise;Patient/family education   PT Goals (Current goals can be found in the Care Plan section) Acute Rehab PT Goals PT Goal Formulation: With patient/family Time For Goal Achievement: 11/24/13 Potential to Achieve Goals: Fair    Frequency Min 3X/week   Barriers to discharge        Co-evaluation               End of Session   Activity Tolerance: Patient limited by fatigue Patient left: in bed;with call bell/phone within reach;with bed alarm set Nurse Communication: Mobility status (nsg tech aware of inability to stand today, +2 assist)         Time: [id number] PT Time Calculation (min): 12 min   Charges:   PT Evaluation $Initial PT Evaluation Tier I: 1 Procedure PT Treatments $Therapeutic Activity: 8-22 mins   PT G CodesLynnell Catalan:          Mirian Casco E Jkwon Treptow 11/10/2013, 11:56 AM Zenovia JarredKati Rajah Tagliaferro, PT, DPT 11/10/2013 Pager: [phone]

## 2013-11-10 NOTE — Progress Notes (Signed)
Nutrition Brief Note  Patient identified on the Malnutrition Screening Tool (MST) Report  Wt Readings from Last 15 Encounters:  11/10/13 128 lb 15.5 oz (58.5 kg)    Body mass index is 20.19 kg/(m^2). Patient meets criteria for normal weight based on current BMI.   Current diet order is regular, patient is consuming approximately 80-90% of meals at this time. Labs and medications reviewed. Admitted with loss of consciousness after having large BM at home. Pt with history of hypertension, asthma, hyperlipidemia has been having some suprapubic pain for last 2 days and constipation. Had additional large BM last night.   Pt reports eating 2 meals/day at home and drinks Ensure occasionally. Denies any changes in weight. Excellent intake during admission which was confirmed by nurse tech. Will order Ensure Complete PRN as desired by pt.   No further nutrition interventions warranted at this time. If nutrition issues arise, please consult RD.   Levon HedgerHeather Baron MS, RD, LDN [phone] Pager [phone] After Hours Pager

## 2013-11-10 NOTE — H&P (Addendum)
[facility] History and Physical  Wendy RamsRuth K Marando WUJ:[id number] DOB: 06/02/1924 DOA: 11/09/2013  Referring physician: ER physician. PCP: No primary provider on file. [facility].  Chief Complaint: Loss of consciousness.  History obtained from patient started.  HPI: Wendy Vasquez is a 80 y.o. female history of hypertension, asthma, hyperlipidemia has been having some suprapubic pain for last 2 days and constipation. Today patient had a large bowel movement in the bathroom and suddenly lost consciousness following which. Patient was consciousness for less than a minute and regained spontaneously after patient was made to lie down. Patient did not have any chest pain shortness of breath nausea vomiting. Patient was nonfocal. UA shows features concerning for UTI. Patient has severe restriction of her mobility due to arthritis. On my exam patient denies any chest pain or shortness of breath and abdomen is nontender. CT abdomen and pelvis is unremarkable. Patient has been admitted for further observation. EKG done is difficult to interpret due to artifacts and patient has been shaking.   Review of Systems: As presented in the history of presenting illness, rest negative.  Past Medical History  Diagnosis Date  . Diabetes mellitus without complication   . Hypertension   . Asthma   . HLD (hyperlipidemia)    Past Surgical History  Procedure Laterality Date  . Joint replacement     Social History:  reports that she has quit smoking. Her smoking use included Cigarettes. She smoked 0.00 packs per day. She has quit using smokeless tobacco. She reports that she does not drink alcohol or use illicit drugs. Where does patient live home. Can patient participate in ADLs? No.  Allergies  Allergen Reactions  . Sulphur [Sulfur] Anaphylaxis    Family History:  Family History  Problem Relation Age of Onset  . Colon cancer Mother       Prior to Admission medications   Medication  Sig Start Date End Date Taking? Authorizing Provider  acetaminophen (TYLENOL) 500 MG tablet Take 500 mg by mouth 2 (two) times daily as needed for mild pain.   Yes Historical Provider, MD  ADVAIR DISKUS 250-50 MCG/DOSE AEPB Inhale 2 puffs into the lungs at bedtime. 09/27/13  Yes Historical Provider, MD  ALPRAZolam Prudy Feeler(XANAX) 0.25 MG tablet Take 2 tablets by mouth at bedtime as needed for sleep.  11/06/13  Yes Historical Provider, MD  aspirin 81 MG chewable tablet Chew 81 mg by mouth every morning.   Yes Historical Provider, MD  BENICAR 20 MG tablet Take 1 tablet by mouth every morning. 09/27/13  Yes Historical Provider, MD  calcium-vitamin D (OSCAL WITH D) 500-200 MG-UNIT per tablet Take 1 tablet by mouth 2 (two) times daily.   Yes Historical Provider, MD  gemfibrozil (LOPID) 600 MG tablet Take 1 tablet by mouth 2 (two) times daily. 10/23/13  Yes Historical Provider, MD  Multiple Vitamin (MULTIVITAMIN WITH MINERALS) TABS tablet Take 1 tablet by mouth daily.   Yes Historical Provider, MD  naphazoline-pheniramine (NAPHCON-A) 0.025-0.3 % ophthalmic solution Place 1 drop into both eyes at bedtime as needed for irritation.   Yes Historical Provider, MD  NEXIUM 40 MG capsule Take 1 capsule by mouth daily. 10/23/13  Yes Historical Provider, MD  vitamin B-12 (CYANOCOBALAMIN) 1000 MCG tablet Take 1,000 mcg by mouth daily.   Yes Historical Provider, MD    Physical Exam: Filed Vitals:   11/09/13 2142 11/09/13 2143 11/09/13 2144 11/09/13 2151  BP: 122/56 120/54 120/54 126/48  Pulse: 74 75 65 80  Temp:  TempSrc:      Resp:   15   SpO2:   93%      General:  Well-developed and moderately nourished.  Eyes: Anicteric no pallor.  ENT: No discharge from ears eyes nose and mouth.  Neck: No mass felt.  Cardiovascular: S1-S2 heard.  Respiratory: No rhonchi crepitations.  Abdomen: Soft nontender was of less than. No guarding rigidity.  Skin: No rash.  Musculoskeletal: Patient has severe restrictions of  her extremity movements due to arthritis.  Psychiatric: Appears normal.  Neurologic: Alert awake oriented to time place and person and moves all extremities.  Labs on Admission:  Basic Metabolic Panel:  Recent Labs Lab 11/09/13 2040  NA 144  K 4.2  CL 107  CO2 19  GLUCOSE 151*  BUN 22  CREATININE 0.92  CALCIUM 11.1*   Liver Function Tests: No results found for this basename: AST, ALT, ALKPHOS, BILITOT, PROT, ALBUMIN,  in the last 168 hours No results found for this basename: LIPASE, AMYLASE,  in the last 168 hours No results found for this basename: AMMONIA,  in the last 168 hours CBC:  Recent Labs Lab 11/09/13 2040  WBC 14.0*  NEUTROABS 12.4*  HGB 10.9*  HCT 32.8*  MCV 97.6  PLT 264   Cardiac Enzymes:  Recent Labs Lab 11/09/13 2040  TROPONINI <0.30    BNP (last 3 results) No results found for this basename: PROBNP,  in the last 8760 hours CBG: No results found for this basename: GLUCAP,  in the last 168 hours  Radiological Exams on Admission: Dg Abd Acute W/chest  11/09/2013   CLINICAL DATA:  Syncope.  Constipation.  Lower abdominal pain.  EXAM: ACUTE ABDOMEN SERIES (ABDOMEN 2 VIEW & CHEST 1 VIEW)  COMPARISON:  CT ABD/PELVIS W CM dated 06/12/2010; DG ABD ACUTE W/CHEST dated 06/12/2010; DG CHEST 2V dated 04/14/2010  FINDINGS: Shallow inspiration. Heart size and pulmonary vascularity are normal. Interstitial changes likely representing chronic bronchitis. No focal airspace disease or consolidation. Sclerosis in the proximal right humerus likely representing bone infarct or enchondroma.  Scattered gas and stool in the colon. No small or large bowel distention. No free intra-abdominal air. No abnormal air-fluid levels. No radiopaque stones.  IMPRESSION: No evidence of active pulmonary disease. Nonobstructive bowel gas pattern.   Electronically Signed   By: Burman NievesWilliam  Stevens M.D.   On: 11/09/2013 21:46    EKG: Independently reviewed. Rhythm difficult to  interpret.  Assessment/Plan Principal Problem:   Syncope Active Problems:   HTN (hypertension)   HLD (hyperlipidemia)   Anemia   1. Syncope - suspect most likely secondary to vasovagal given the large bowel movement. Observe in telemetry. Check 2-D echo. 2. UTI - follow cultures. Patient is placed on antibiotics. 3. Mild hypercalcemia - follow metabolic panel. Further workup as outpatient. 4. Hypertension - continue home medications. 5. Hyperlipidemia - continue present medications. 6. History of asthma - presently not wheezing. Continue inhaler. 7. History of diabetes mellitus presently on no medications - observe. 8. Severe arthritis with restricted mobility.    Code Status: DO NOT RESUSCITATE.  Family Communication: Patient's daughter.  Disposition Plan: Admit for observation.    Eduard ClosArshad N Sheli Dorin [facility] Pager [phone].  If 7PM-7AM, please contact night-coverage [email or web address] Password [email or web address] 11/10/2013, 12:14 AM

## 2013-11-11 LAB — CBC
HCT: 28.6 % — ABNORMAL LOW (ref 36.0–46.0)
Hemoglobin: 9.6 g/dL — ABNORMAL LOW (ref 12.0–15.0)
MCH: 32.5 pg (ref 26.0–34.0)
MCHC: 33.6 g/dL (ref 30.0–36.0)
MCV: 96.9 fL (ref 78.0–100.0)
PLATELETS: 237 10*3/uL (ref 150–400)
RBC: 2.95 MIL/uL — ABNORMAL LOW (ref 3.87–5.11)
RDW: 12.5 % (ref 11.5–15.5)
WBC: 6.7 10*3/uL (ref 4.0–10.5)

## 2013-11-11 LAB — BASIC METABOLIC PANEL
BUN: 25 mg/dL — ABNORMAL HIGH (ref 6–23)
CO2: 24 meq/L (ref 19–32)
Calcium: 10.9 mg/dL — ABNORMAL HIGH (ref 8.4–10.5)
Chloride: 104 mEq/L (ref 96–112)
Creatinine, Ser: 1.15 mg/dL — ABNORMAL HIGH (ref 0.50–1.10)
GFR calc Af Amer: 48 mL/min — ABNORMAL LOW (ref >90)
GFR calc non Af Amer: 41 mL/min — ABNORMAL LOW (ref >90)
Glucose, Bld: 98 mg/dL (ref 70–99)
Potassium: 4.3 mEq/L (ref 3.7–5.3)
SODIUM: 139 meq/L (ref 137–147)

## 2013-11-11 MED ORDER — SENNOSIDES-DOCUSATE SODIUM 8.6-50 MG PO TABS: 1.0000 | ORAL_TABLET | Freq: Every evening | ORAL | Status: DC | PRN

## 2013-11-11 MED ORDER — SODIUM CHLORIDE 0.9 % IV BOLUS (SEPSIS)
500.0000 mL | Freq: Once | INTRAVENOUS | Status: AC
  Administered 2013-11-11: 500 mL via INTRAVENOUS

## 2013-11-11 MED ORDER — POLYETHYLENE GLYCOL 3350 17 G PO PACK: 17.0000 g | PACK | Freq: Every day | ORAL | Status: DC

## 2013-11-11 MED ORDER — CIPROFLOXACIN HCL 500 MG PO TABS: 500.0000 mg | ORAL_TABLET | Freq: Two times a day (BID) | ORAL | Status: DC

## 2013-11-11 NOTE — Discharge Instructions (Signed)
You were cared for by a hospitalist during your hospital stay. If you have any questions about your discharge medications or the care you received while you were in the hospital after you are discharged, you can call the unit and asked to speak with the hospitalist on call if the hospitalist that took care of you is not available. Once you are discharged, your primary care physician will handle any further medical issues. Please note that NO REFILLS for any discharge medications will be authorized once you are discharged, as it is imperative that you return to your primary care physician (or establish a relationship with a primary care physician if you do not have one) for your aftercare needs so that they can reassess your need for medications and monitor your lab values.     If you do not have a primary care physician, you can call [phone] for a physician referral.  Follow with Primary MD in 1-2 weeks   Monitor blood pressure and discuss with primary MD about restarting Benicar if her renal function is stable.   Get CBC, CMP checked by your doctor and again as further instructed.  Get a 2 view Chest X ray done next visit if you had Pneumonia of Lung problems at the Hospital.  Get Medicines reviewed and adjusted.  Please request your Prim.MD to go over all Hospital Tests and Procedure/Radiological results at the follow up, please get all Hospital records sent to your Prim MD by signing hospital release before you go home.  Activity: As tolerated with Full fall precautions use walker/cane & assistance as needed  Diet: regular  For Heart failure patients - Check your Weight same time everyday, if you gain over 2 pounds, or you develop in leg swelling, experience more shortness of breath or chest pain, call your Primary MD immediately. Follow Cardiac Low Salt Diet and 1.8 lit/day fluid restriction.  Disposition Home  If you experience worsening of your admission symptoms, develop shortness of  breath, life threatening emergency, suicidal or homicidal thoughts you must seek medical attention immediately by calling 911 or calling your MD immediately  if symptoms less severe.  You Must read complete instructions/literature along with all the possible adverse reactions/side effects for all the Medicines you take and that have been prescribed to you. Take any new Medicines after you have completely understood and accpet all the possible adverse reactions/side effects.   Do not drive and provide baby sitting services if your were admitted for syncope or siezures until you have seen by Primary MD or a Neurologist and advised to do so again.  Do not drive when taking Pain medications.   Do not take more than prescribed Pain, Sleep and Anxiety Medications  Special Instructions: If you have smoked or chewed Tobacco  in the last 2 yrs please stop smoking, stop any regular Alcohol  and or any Recreational drug use.  Wear Seat belts while driving.

## 2013-11-12 DIAGNOSIS — N179 Acute kidney failure, unspecified: Secondary | ICD-10-CM

## 2013-11-12 LAB — URINE CULTURE

## 2013-11-12 NOTE — Discharge Summary (Signed)
Physician Discharge Summary  Wendy Vasquez WJX:[id number] DOB: 1924-07-18 DOA: 11/09/2013   PCP: No primary provider on file.  Admit date: 11/09/2013 Discharge date: 11/12/2013  Time spent: 35 minutes  Recommendations for Outpatient Follow-up:  1. Follow up with PCP in 5-7 days   Recommendations for primary care physician for things to follow:  Follow up urine culture Repeat BMP and consider re-starting her ARB  Discharge Diagnoses:  Principal Problem:   Syncope Active Problems:   HTN (hypertension)   HLD (hyperlipidemia)   Anemia   AKI (acute kidney injury)  Discharge Condition: stable  Diet recommendation: regular  Filed Weights   11/10/13 0202  Weight: 58.5 kg (128 lb 15.5 oz)   History of present illness:  Wendy Vasquez is a 80 y.o. female history of hypertension, asthma, hyperlipidemia has been having some suprapubic pain for last 2 days and constipation. Today patient had a large bowel movement in the bathroom and suddenly lost consciousness following which. Patient was consciousness for less than a minute and regained spontaneously after patient was made to lie down. Patient did not have any chest pain shortness of breath nausea vomiting. Patient was nonfocal. UA shows features concerning for UTI. Patient has severe restriction of her mobility due to arthritis. On my exam patient denies any chest pain or shortness of breath and abdomen is nontender. CT abdomen and pelvis is unremarkable. Patient has been admitted for further observation. EKG done is difficult to interpret due to artifacts and patient has been shaking.   Hospital Course:  Patient was admitted with a syncopal episode likely vasovagal given the large bowel movement. She was observed on telemetry with no significant arrhythmias. She underwent a 2-D echo which showed normal systolic function. Patient had a UTI, was initially on ceftriaxone, and her antibiotic was narrowed to ciprofloxacin on discharge.  Preliminary microbiology shows Escherichia coli, I discussed with the patient and her daughter and he'll followup in a few days with her primary care provider and followup on the urine cultures. In the setting of her urinary tract infection, mild dehydration, as well as IV contrast initially for the CT of the abdomen and pelvis, patient had mild AKI with a crit 1.2, which improved by the next morning to 1.1. I will stop however her ARB and she will follow up with her PCP to recheck renal function and consider restarting this medication. Her daughter is a Agricultural engineer and will monitor her BP daily while at home.   Procedures:  2D echo  Study Conclusions - Left ventricle: The cavity size was normal. Wall thickness was normal. Systolic function was normal. The estimated ejection fraction was in the range of 55% to 60%. Wall motion was normal; there were no regional wall motion abnormalities. There was an increased relative contribution of atrial contraction to ventricular filling. - Aortic valve: Trivial regurgitation.   Consultations:  None   Discharge Exam: Filed Vitals:   11/10/13 2108 11/11/13 0531 11/11/13 1000 11/11/13 1438  BP: 100/45 124/48  111/49  Pulse: 53 62  63  Temp: 97.6 F (36.4 C) 97.2 F (36.2 C)  98.4 F (36.9 C)  TempSrc: Oral Oral  Oral  Resp: 18 16  16   Height:      Weight:      SpO2: 96% 97% 95% 96%   General: NAD Cardiovascular: RRR Respiratory: CTA biL  Discharge Instructions    Medication List    STOP taking these medications  BENICAR 20 MG tablet  Generic drug:  olmesartan      TAKE these medications       acetaminophen 500 MG tablet  Commonly known as:  TYLENOL  Take 500 mg by mouth 2 (two) times daily as needed for mild pain.     ADVAIR DISKUS 250-50 MCG/DOSE Aepb  Generic drug:  Fluticasone-Salmeterol  Inhale 2 puffs into the lungs at bedtime.     ALPRAZolam 0.25 MG tablet  Commonly known as:  XANAX  Take 2 tablets by mouth at  bedtime as needed for sleep.     aspirin 81 MG chewable tablet  Chew 81 mg by mouth every morning.     calcium-vitamin D 500-200 MG-UNIT per tablet  Commonly known as:  OSCAL WITH D  Take 1 tablet by mouth 2 (two) times daily.     ciprofloxacin 500 MG tablet  Commonly known as:  CIPRO  Take 1 tablet (500 mg total) by mouth 2 (two) times daily.     gemfibrozil 600 MG tablet  Commonly known as:  LOPID  Take 1 tablet by mouth 2 (two) times daily.     multivitamin with minerals Tabs tablet  Take 1 tablet by mouth daily.     naphazoline-pheniramine 0.025-0.3 % ophthalmic solution  Commonly known as:  NAPHCON-A  Place 1 drop into both eyes at bedtime as needed for irritation.     NEXIUM 40 MG capsule  Generic drug:  esomeprazole  Take 1 capsule by mouth daily.     polyethylene glycol packet  Commonly known as:  MIRALAX / GLYCOLAX  Take 17 g by mouth daily.     senna-docusate 8.6-50 MG per tablet  Commonly known as:  Senokot-S  Take 1 tablet by mouth at bedtime as needed for mild constipation.     vitamin B-12 1000 MCG tablet  Commonly known as:  CYANOCOBALAMIN  Take 1,000 mcg by mouth daily.           Follow-up Information   Follow up with PCP . Schedule an appointment as soon as possible for a visit in 2 weeks.      The results of significant diagnostics from this hospitalization (including imaging, microbiology, ancillary and laboratory) are listed below for reference.    Significant Diagnostic Studies: Ct Abdomen Pelvis W Contrast  11/10/2013   CLINICAL DATA:  Abdominal pain  EXAM: CT ABDOMEN AND PELVIS WITH CONTRAST  TECHNIQUE: Multidetector CT imaging of the abdomen and pelvis was performed using the standard protocol following bolus administration of intravenous contrast.  CONTRAST:  100mL OMNIPAQUE IOHEXOL 300 MG/ML  SOLN  COMPARISON:  DG ABD ACUTE W/CHEST dated 11/09/2013; CT ABD/PELVIS W CM dated 06/12/2010  FINDINGS: Fibrosis at the posterior lung bases.   Gallbladder is decompressed  Liver, spleen, pancreas, adrenal glands, and kidneys are within normal limits.  The appendix is nonvisualized. Bladder, uterus, and adnexa are within normal limits  Sigmoid diverticulosis without acute diverticulitis  Degenerative changes in the lumbar spine.  IMPRESSION: No acute intra-abdominal pathology.   Electronically Signed   By: Maryclare BeanArt  Hoss M.D.   On: 11/10/2013 00:34   Dg Abd Acute W/chest  11/09/2013   CLINICAL DATA:  Syncope.  Constipation.  Lower abdominal pain.  EXAM: ACUTE ABDOMEN SERIES (ABDOMEN 2 VIEW & CHEST 1 VIEW)  COMPARISON:  CT ABD/PELVIS W CM dated 06/12/2010; DG ABD ACUTE W/CHEST dated 06/12/2010; DG CHEST 2V dated 04/14/2010  FINDINGS: Shallow inspiration. Heart size and pulmonary vascularity are normal. Interstitial changes likely  representing chronic bronchitis. No focal airspace disease or consolidation. Sclerosis in the proximal right humerus likely representing bone infarct or enchondroma.  Scattered gas and stool in the colon. No small or large bowel distention. No free intra-abdominal air. No abnormal air-fluid levels. No radiopaque stones.  IMPRESSION: No evidence of active pulmonary disease. Nonobstructive bowel gas pattern.   Electronically Signed   By: William  Stevens M.D.   On: 11/09/2013 21:46    Microbiology: Recent Results (from the past 24Burman Nieves0 hour(s))  URINE CULTURE     Status: None   Collection Time    11/09/13  9:42 PM      Result Value Ref Range Status   Specimen Description URINE, CATHETERIZED   Final   Special Requests NONE   Final   Culture  Setup Time     Final   Value: 11/10/2013 04:16     Performed at [facility]   Colony Count     Final   Value: >=100,000 COLONIES/ML     [facility]formed at [facility]   Culture     Final   Value: ESCHERICHIA COLI     [facility]formed at [facility]   Report Status PENDING   Incomplete     Labs: Basic Metabolic Panel:  Recent Labs Lab 11/09/13 2040 11/10/13 0213  11/10/13 1740 11/11/13 0545  NA 144 135* 138 139  K 4.2 4.5 3.8 4.3  CL 107 102 102 104  CO2 19 20 23 24   GLUCOSE 151* 141* 111* 98  BUN 22 21 22  25*  CREATININE 0.92 0.96 1.22* 1.15*  CALCIUM 11.1* 10.6* 10.7* 10.9*   Liver Function Tests:  Recent Labs Lab 11/10/13 0213  AST 21  ALT 7  ALKPHOS 87  BILITOT 0.3  PROT 6.5  ALBUMIN 3.5   CBC:  Recent Labs Lab 11/09/13 2040 11/10/13 0213 11/11/13 0545  WBC 14.0* 14.3* 6.7  NEUTROABS 12.4* 12.6*  --   HGB 10.9* 10.4* 9.6*  HCT 32.8* 31.5* 28.6*  MCV 97.6 96.9 96.9  PLT 264 236 237   Cardiac Enzymes:  Recent Labs Lab 11/09/13 2040 11/10/13 0217  TROPONINI <0.30 <0.30    Signed:  Daylene Katayamaostin M Gherghe  [facility] 11/12/2013, 1:50 PM

## 2014-04-06 ENCOUNTER — Emergency Department (HOSPITAL_COMMUNITY)
Admission: EM | Admit: 2014-04-06 | Discharge: 2014-04-06 | Disposition: A | Discharge status: Home / Self Care | Payer: Medicare Other | Attending: Emergency Medicine | Admitting: Emergency Medicine

## 2014-04-06 ENCOUNTER — Encounter (HOSPITAL_COMMUNITY): Payer: Self-pay | Admitting: Emergency Medicine

## 2014-04-06 ENCOUNTER — Emergency Department (HOSPITAL_COMMUNITY): Payer: Medicare Other

## 2014-04-06 DIAGNOSIS — Z87891 Personal history of nicotine dependence: Secondary | ICD-10-CM | POA: Diagnosis not present

## 2014-04-06 DIAGNOSIS — J40 Bronchitis, not specified as acute or chronic: Secondary | ICD-10-CM

## 2014-04-06 DIAGNOSIS — E785 Hyperlipidemia, unspecified: Secondary | ICD-10-CM | POA: Insufficient documentation

## 2014-04-06 DIAGNOSIS — R05 Cough: Secondary | ICD-10-CM | POA: Diagnosis present

## 2014-04-06 DIAGNOSIS — E119 Type 2 diabetes mellitus without complications: Secondary | ICD-10-CM | POA: Diagnosis not present

## 2014-04-06 DIAGNOSIS — Z79899 Other long term (current) drug therapy: Secondary | ICD-10-CM | POA: Diagnosis not present

## 2014-04-06 DIAGNOSIS — R059 Cough, unspecified: Secondary | ICD-10-CM | POA: Diagnosis present

## 2014-04-06 DIAGNOSIS — Z792 Long term (current) use of antibiotics: Secondary | ICD-10-CM | POA: Diagnosis not present

## 2014-04-06 DIAGNOSIS — J45909 Unspecified asthma, uncomplicated: Secondary | ICD-10-CM | POA: Diagnosis not present

## 2014-04-06 DIAGNOSIS — R509 Fever, unspecified: Secondary | ICD-10-CM | POA: Diagnosis not present

## 2014-04-06 DIAGNOSIS — Z7982 Long term (current) use of aspirin: Secondary | ICD-10-CM | POA: Diagnosis not present

## 2014-04-06 DIAGNOSIS — I10 Essential (primary) hypertension: Secondary | ICD-10-CM | POA: Diagnosis not present

## 2014-04-06 LAB — URINALYSIS, ROUTINE W REFLEX MICROSCOPIC
Bilirubin Urine: NEGATIVE
GLUCOSE, UA: NEGATIVE mg/dL
Ketones, ur: NEGATIVE mg/dL
Leukocytes, UA: NEGATIVE
Nitrite: NEGATIVE
PH: 6.5 (ref 5.0–8.0)
Protein, ur: NEGATIVE mg/dL
SPECIFIC GRAVITY, URINE: 1.016 (ref 1.005–1.030)
Urobilinogen, UA: 0.2 mg/dL (ref 0.0–1.0)

## 2014-04-06 LAB — CBC
HCT: 39.1 % (ref 36.0–46.0)
HEMOGLOBIN: 12.9 g/dL (ref 12.0–15.0)
MCH: 31.3 pg (ref 26.0–34.0)
MCHC: 33 g/dL (ref 30.0–36.0)
MCV: 94.9 fL (ref 78.0–100.0)
Platelets: 276 10*3/uL (ref 150–400)
RBC: 4.12 MIL/uL (ref 3.87–5.11)
RDW: 12.8 % (ref 11.5–15.5)
WBC: 10.5 10*3/uL (ref 4.0–10.5)

## 2014-04-06 LAB — COMPREHENSIVE METABOLIC PANEL
ALBUMIN: 3.7 g/dL (ref 3.5–5.2)
ALK PHOS: 137 U/L — AB (ref 39–117)
ALT: 8 U/L (ref 0–35)
AST: 20 U/L (ref 0–37)
Anion gap: 14 (ref 5–15)
BUN: 12 mg/dL (ref 6–23)
CHLORIDE: 101 meq/L (ref 96–112)
CO2: 25 mEq/L (ref 19–32)
Calcium: 11 mg/dL — ABNORMAL HIGH (ref 8.4–10.5)
Creatinine, Ser: 0.97 mg/dL (ref 0.50–1.10)
GFR calc Af Amer: 59 mL/min — ABNORMAL LOW (ref >90)
GFR calc non Af Amer: 51 mL/min — ABNORMAL LOW (ref >90)
Glucose, Bld: 121 mg/dL — ABNORMAL HIGH (ref 70–99)
POTASSIUM: 3.9 meq/L (ref 3.7–5.3)
Sodium: 140 mEq/L (ref 137–147)
TOTAL PROTEIN: 7.8 g/dL (ref 6.0–8.3)
Total Bilirubin: 0.6 mg/dL (ref 0.3–1.2)

## 2014-04-06 LAB — TROPONIN I: Troponin I: <0.3 ng/mL (ref <0.30)

## 2014-04-06 LAB — URINE MICROSCOPIC-ADD ON

## 2014-04-06 LAB — I-STAT CG4 LACTIC ACID, ED: LACTIC ACID, VENOUS: 1.21 mmol/L (ref 0.5–2.2)

## 2014-04-06 MED ORDER — DEXTROSE 5 % IV SOLN
500.0000 mg | Freq: Once | INTRAVENOUS | Status: AC
  Administered 2014-04-06: 500 mg via INTRAVENOUS
  Filled 2014-04-06: qty 500

## 2014-04-06 MED ORDER — DEXTROSE 5 % IV SOLN
1.0000 g | Freq: Once | INTRAVENOUS | Status: AC
  Administered 2014-04-06: 1 g via INTRAVENOUS
  Filled 2014-04-06: qty 10

## 2014-04-06 NOTE — Discharge Instructions (Signed)
Rest. Drink plenty of fluids. Complete the course of your antibiotic as prescribed by your doctor. Follow up with your doctor for recheck in the next few days.  Return to ER if worse, new symptoms, trouble breathing, vomiting, weak/faint, other concern.       Cough, Adult  A cough is a reflex that helps clear your throat and airways. It can help heal the body or may be a reaction to an irritated airway. A cough may only last 2 or 3 weeks (acute) or may last more than 8 weeks (chronic).  CAUSES Acute cough:  Viral or bacterial infections. Chronic cough:  Infections.  Allergies.  Asthma.  Post-nasal drip.  Smoking.  Heartburn or acid reflux.  Some medicines.  Chronic lung problems (COPD).  Cancer. SYMPTOMS   Cough.  Fever.  Chest pain.  Increased breathing rate.  High-pitched whistling sound when breathing (wheezing).  Colored mucus that you cough up (sputum). TREATMENT   A bacterial cough may be treated with antibiotic medicine.  A viral cough must run its course and will not respond to antibiotics.  Your caregiver may recommend other treatments if you have a chronic cough. HOME CARE INSTRUCTIONS   Only take over-the-counter or prescription medicines for pain, discomfort, or fever as directed by your caregiver. Use cough suppressants only as directed by your caregiver.  Use a cold steam vaporizer or humidifier in your bedroom or home to help loosen secretions.  Sleep in a semi-upright position if your cough is worse at night.  Rest as needed.  Stop smoking if you smoke. SEEK IMMEDIATE MEDICAL CARE IF:   You have pus in your sputum.  Your cough starts to worsen.  You cannot control your cough with suppressants and are losing sleep.  You begin coughing up blood.  You have difficulty breathing.  You develop pain which is getting worse or is uncontrolled with medicine.  You have a fever. MAKE SURE YOU:   Understand these  instructions.  Will watch your condition.  Will get help right away if you are not doing well or get worse. Document Released: 01/02/2011 Document Revised: 09/28/2011 Document Reviewed: 01/02/2011 [facility] Patient Information 2015 [place], [place]. This information is not intended to replace advice given to you by your health care provider. Make sure you discuss any questions you have with your health care provider.    Fever, Adult A fever is a higher than normal body temperature. In an adult, an oral temperature around 98.6 F (37 C) is considered normal. A temperature of 100.4 F (38 C) or higher is generally considered a fever. Mild or moderate fevers generally have no long-term effects and often do not require treatment. Extreme fever (greater than or equal to 106 F or 41.1 C) can cause seizures. The sweating that may occur with repeated or prolonged fever may cause dehydration. Elderly people can develop confusion during a fever. A measured temperature can vary with:  Age.  Time of day.  Method of measurement (mouth, underarm, rectal, or ear). The fever is confirmed by taking a temperature with a thermometer. Temperatures can be taken different ways. Some methods are accurate and some are not.  An oral temperature is used most commonly. Electronic thermometers are fast and accurate.  An ear temperature will only be accurate if the thermometer is positioned as recommended by the manufacturer.  A rectal temperature is accurate and done for those adults who have a condition where an oral temperature cannot be taken.  An underarm (axillary)  temperature is not accurate and not recommended. Fever is a symptom, not a disease.  CAUSES   Infections commonly cause fever.  Some noninfectious causes for fever include:  Some arthritis conditions.  Some thyroid or adrenal gland conditions.  Some immune system conditions.  Some types of cancer.  A medicine reaction.  High doses  of certain street drugs such as methamphetamine.  Dehydration.  Exposure to high outside or room temperatures.  Occasionally, the source of a fever cannot be determined. This is sometimes called a "fever of unknown origin" (FUO).  Some situations may lead to a temporary rise in body temperature that may go away on its own. Examples are:  Childbirth.  Surgery.  Intense exercise. HOME CARE INSTRUCTIONS   Take appropriate medicines for fever. Follow dosing instructions carefully. If you use acetaminophen to reduce the fever, be careful to avoid taking other medicines that also contain acetaminophen. Do not take aspirin for a fever if you are younger than age 38. There is an association with Reye's syndrome. Reye's syndrome is a rare but potentially deadly disease.  If an infection is present and antibiotics have been prescribed, take them as directed. Finish them even if you start to feel better.  Rest as needed.  Maintain an adequate fluid intake. To prevent dehydration during an illness with prolonged or recurrent fever, you may need to drink extra fluid.Drink enough fluids to keep your urine clear or pale yellow.  Sponging or bathing with room temperature water may help reduce body temperature. Do not use ice water or alcohol sponge baths.  Dress comfortably, but do not over-bundle. SEEK MEDICAL CARE IF:   You are unable to keep fluids down.  You develop vomiting or diarrhea.  You are not feeling at least partly better after 3 days.  You develop new symptoms or problems. SEEK IMMEDIATE MEDICAL CARE IF:   You have shortness of breath or trouble breathing.  You develop excessive weakness.  You are dizzy or you faint.  You are extremely thirsty or you are making little or no urine.  You develop new pain that was not there before (such as in the head, neck, chest, back, or abdomen).  You have persistent vomiting and diarrhea for more than 1 to 2 days.  You develop a  stiff neck or your eyes become sensitive to light.  You develop a skin rash.  You have a fever or persistent symptoms for more than 2 to 3 days.  You have a fever and your symptoms suddenly get worse. MAKE SURE YOU:   Understand these instructions.  Will watch your condition.  Will get help right away if you are not doing well or get worse. Document Released: 12/30/2000 Document Revised: 11/20/2013 Document Reviewed: 05/07/2011 [facility] Patient Information 2015 [place], [place]. This information is not intended to replace advice given to you by your health care provider. Make sure you discuss any questions you have with your health care provider.    Acute Bronchitis Bronchitis is inflammation of the airways that extend from the windpipe into the lungs (bronchi). The inflammation often causes mucus to develop. This leads to a cough, which is the most common symptom of bronchitis.  In acute bronchitis, the condition usually develops suddenly and goes away over time, usually in a couple weeks. Smoking, allergies, and asthma can make bronchitis worse. Repeated episodes of bronchitis may cause further lung problems.  CAUSES Acute bronchitis is most often caused by the same virus that causes a cold. The  virus can spread from person to person (contagious) through coughing, sneezing, and touching contaminated objects. SIGNS AND SYMPTOMS   Cough.   Fever.   Coughing up mucus.   Body aches.   Chest congestion.   Chills.   Shortness of breath.   Sore throat.  DIAGNOSIS  Acute bronchitis is usually diagnosed through a physical exam. Your health care provider will also ask you questions about your medical history. Tests, such as chest X-rays, are sometimes done to rule out other conditions.  TREATMENT  Acute bronchitis usually goes away in a couple weeks. Oftentimes, no medical treatment is necessary. Medicines are sometimes given for relief of fever or cough. Antibiotic  medicines are usually not needed but may be prescribed in certain situations. In some cases, an inhaler may be recommended to help reduce shortness of breath and control the cough. A cool mist vaporizer may also be used to help thin bronchial secretions and make it easier to clear the chest.  HOME CARE INSTRUCTIONS  Get plenty of rest.   Drink enough fluids to keep your urine clear or pale yellow (unless you have a medical condition that requires fluid restriction). Increasing fluids may help thin your respiratory secretions (sputum) and reduce chest congestion, and it will prevent dehydration.   Take medicines only as directed by your health care provider.  If you were prescribed an antibiotic medicine, finish it all even if you start to feel better.  Avoid smoking and secondhand smoke. Exposure to cigarette smoke or irritating chemicals will make bronchitis worse. If you are a smoker, consider using nicotine gum or skin patches to help control withdrawal symptoms. Quitting smoking will help your lungs heal faster.   Reduce the chances of another bout of acute bronchitis by washing your hands frequently, avoiding people with cold symptoms, and trying not to touch your hands to your mouth, nose, or eyes.   Keep all follow-up visits as directed by your health care provider.  SEEK MEDICAL CARE IF: Your symptoms do not improve after 1 week of treatment.  SEEK IMMEDIATE MEDICAL CARE IF:  You develop an increased fever or chills.   You have chest pain.   You have severe shortness of breath.  You have bloody sputum.   You develop dehydration.  You faint or repeatedly feel like you are going to pass out.  You develop repeated vomiting.  You develop a severe headache. MAKE SURE YOU:   Understand these instructions.  Will watch your condition.  Will get help right away if you are not doing well or get worse. Document Released: 08/13/2004 Document Revised: 11/20/2013 Document  Reviewed: 12/27/2012 [facility] Patient Information 2015 [place], [place]. This information is not intended to replace advice given to you by your health care provider. Make sure you discuss any questions you have with your health care provider.

## 2014-04-06 NOTE — ED Notes (Signed)
Bed: WA06 Expected date:  Expected time:  Means of arrival:  Comments: EMS-fever/cough

## 2014-04-06 NOTE — ED Notes (Signed)
Patient presents today from home via EMS who reports patient has had productive cough and fever x 1 day. Patient is alert per baseline and is poor historian.

## 2014-04-06 NOTE — ED Provider Notes (Signed)
CSN: [id number]     Arrival date & time 04/06/14  4098 History   First MD Initiated Contact with Patient 04/06/14 [phone]     Chief Complaint  Patient presents with  . Cough  . Fever     (Consider location/radiation/quality/duration/timing/severity/associated sxs/prior Treatment) Patient is a 80 y.o. female presenting with cough and fever. The history is provided by the patient and a relative. The history is limited by the condition of the patient.  Cough Associated symptoms: fever   Associated symptoms: no chills, no headaches, no rash, no shortness of breath and no sore throat   Fever Associated symptoms: cough   Associated symptoms: no chills, no confusion, no diarrhea, no headaches, no rash, no sore throat and no vomiting   pt with hx htn, dm, noted by family with whom she lives to have increased non productive, episodic, cough in the past 1-2 days, and that patient has felt warm ?fevers. Pt denies sob. +generalized weakness. Is eating and drinking. C/o chest soreness/discomfort w coughing, otherwise no other chest pain or discomfort.   No known ill contacts. No recent hospitalization. No leg pain or swelling. No orthopnea. No trauma or fall.      Past Medical History  Diagnosis Date  . Diabetes mellitus without complication   . Hypertension   . Asthma   . HLD (hyperlipidemia)    Past Surgical History  Procedure Laterality Date  . Joint replacement    . Joint replacement     Family History  Problem Relation Age of Onset  . Colon cancer Mother    History  Substance Use Topics  . Smoking status: Former Smoker    Types: Cigarettes  . Smokeless tobacco: Former Neurosurgeon  . Alcohol Use: No   OB History   Grav Para Term Preterm Abortions TAB SAB Ect Mult Living                 Review of Systems  Constitutional: Positive for fever. Negative for chills.  HENT: Negative for sore throat.   Eyes: Negative for redness.  Respiratory: Positive for cough. Negative for shortness of  breath.   Cardiovascular: Negative for leg swelling.  Gastrointestinal: Negative for vomiting, abdominal pain and diarrhea.  Endocrine: Negative for polyuria.  Genitourinary: Negative for flank pain.  Musculoskeletal: Negative for back pain and neck pain.  Skin: Negative for rash.  Neurological: Negative for headaches.  Hematological: Does not bruise/bleed easily.  Psychiatric/Behavioral: Negative for confusion.      Allergies  Ciprofloxacin and Sulphur  Home Medications   Prior to Admission medications   Medication Sig Start Date End Date Taking? Authorizing Provider  acetaminophen (TYLENOL) 500 MG tablet Take 1,000 mg by mouth 2 (two) times daily as needed for mild pain.    Yes Historical Provider, MD  ADVAIR DISKUS 250-50 MCG/DOSE AEPB Inhale 1 puff into the lungs 2 (two) times daily.  09/27/13  Yes Historical Provider, MD  ALPRAZolam Prudy Feeler) 0.25 MG tablet Take 2 tablets by mouth at bedtime.  11/06/13  Yes Historical Provider, MD  amoxicillin-clavulanate (AUGMENTIN) 875-125 MG per tablet Take 1 tablet by mouth every 12 (twelve) hours. For 10 days 03/29/14  Yes Historical Provider, MD  aspirin EC 81 MG tablet Take 81 mg by mouth daily with breakfast.   Yes Historical Provider, MD  calcium-vitamin D (OSCAL WITH D) 500-200 MG-UNIT per tablet Take 1 tablet by mouth 2 (two) times daily.   Yes Historical Provider, MD  Cranberry 1000 MG CAPS Take 2 capsules  by mouth daily with breakfast.   Yes Historical Provider, MD  dextromethorphan (DELSYM) 30 MG/5ML liquid Take 60 mg by mouth every 12 (twelve) hours as needed for cough.   Yes Historical Provider, MD  esomeprazole (NEXIUM) 40 MG capsule Take 40 mg by mouth at bedtime.    Yes Historical Provider, MD  gemfibrozil (LOPID) 600 MG tablet Take 1 tablet by mouth 2 (two) times daily. 10/23/13  Yes Historical Provider, MD  guaiFENesin (MUCINEX) 600 MG 12 hr tablet Take 600 mg by mouth 2 (two) times daily as needed for cough or to loosen phlegm.    Yes Historical Provider, MD  metoCLOPramide (REGLAN) 10 MG tablet Take 10 mg by mouth daily with breakfast.    Yes Historical Provider, MD  Multiple Vitamin (MULTIVITAMIN WITH MINERALS) TABS tablet Take 1 tablet by mouth daily.   Yes Historical Provider, MD  polyethylene glycol (MIRALAX / GLYCOLAX) packet Take 17 g by mouth daily as needed for mild constipation.   Yes Historical Provider, MD  vitamin B-12 (CYANOCOBALAMIN) 1000 MCG tablet Take 1,000 mcg by mouth daily with breakfast.    Yes Historical Provider, MD   BP 130/69  Temp(Src) 100.4 F (38 C) (Rectal)  Resp 20  SpO2 96% Physical Exam  Nursing note and vitals reviewed. Constitutional: She appears well-developed and well-nourished.  Febrile. Elderly/frail appearing.   HENT:  Head: Atraumatic.  Nose: Nose normal.  Mouth/Throat: Oropharynx is clear and moist.  Eyes: Conjunctivae are normal. No scleral icterus.  Neck: Normal range of motion. Neck supple. No tracheal deviation present.  No stiffness or rigidity  Cardiovascular: Normal rate, regular rhythm, normal heart sounds and intact distal pulses.   No murmur heard. Pulmonary/Chest: Effort normal. No respiratory distress.  Upper resp congestion, non prod cough.   Abdominal: Soft. Normal appearance and bowel sounds are normal. She exhibits no distension and no mass. There is no tenderness. There is no rebound and no guarding.  Genitourinary:  No cva tenderness  Musculoskeletal: She exhibits no edema and no tenderness.  Neurological: She is alert.  Skin: Skin is warm and dry. No rash noted.  Psychiatric: She has a normal mood and affect.    ED Course  Procedures (including critical care time) Labs Review  Results for orders placed during the hospital encounter of 04/06/14  CBC      Result Value Ref Range   WBC 10.5  4.0 - 10.5 K/uL   RBC 4.12  3.87 - 5.11 MIL/uL   Hemoglobin 12.9  12.0 - 15.0 g/dL   HCT 16.1  09.6 - 04.5 %   MCV 94.9  78.0 - 100.0 fL   MCH 31.3   26.0 - 34.0 pg   MCHC 33.0  30.0 - 36.0 g/dL   RDW 40.9  81.1 - 91.4 %   Platelets 276  150 - 400 K/uL  COMPREHENSIVE METABOLIC PANEL      Result Value Ref Range   Sodium 140  137 - 147 mEq/L   Potassium 3.9  3.7 - 5.3 mEq/L   Chloride 101  96 - 112 mEq/L   CO2 25  19 - 32 mEq/L   Glucose, Bld 121 (*) 70 - 99 mg/dL   BUN 12  6 - 23 mg/dL   Creatinine, Ser 7.82  0.50 - 1.10 mg/dL   Calcium 95.6 (*) 8.4 - 10.5 mg/dL   Total Protein 7.8  6.0 - 8.3 g/dL   Albumin 3.7  3.5 - 5.2 g/dL   AST 20  0 - 37 U/L   ALT 8  0 - 35 U/L   Alkaline Phosphatase 137 (*) 39 - 117 U/L   Total Bilirubin 0.6  0.3 - 1.2 mg/dL   GFR calc non Af Amer 51 (*) >90 mL/min   GFR calc Af Amer 59 (*) >90 mL/min   Anion gap 14  5 - 15  TROPONIN I      Result Value Ref Range   Troponin I <0.30  <0.30 ng/mL  URINALYSIS, ROUTINE W REFLEX MICROSCOPIC      Result Value Ref Range   Color, Urine YELLOW  YELLOW   APPearance CLEAR  CLEAR   Specific Gravity, Urine 1.016  1.005 - 1.030   pH 6.5  5.0 - 8.0   Glucose, UA NEGATIVE  NEGATIVE mg/dL   Hgb urine dipstick LARGE (*) NEGATIVE   Bilirubin Urine NEGATIVE  NEGATIVE   Ketones, ur NEGATIVE  NEGATIVE mg/dL   Protein, ur NEGATIVE  NEGATIVE mg/dL   Urobilinogen, UA 0.2  0.0 - 1.0 mg/dL   Nitrite NEGATIVE  NEGATIVE   Leukocytes, UA NEGATIVE  NEGATIVE  URINE MICROSCOPIC-ADD ON      Result Value Ref Range   RBC / HPF TOO NUMEROUS TO COUNT  <3 RBC/hpf  I-STAT CG4 LACTIC ACID, ED      Result Value Ref Range   Lactic Acid, Venous 1.21  0.5 - 2.2 mmol/L   Dg Chest 2 View  04/06/2014   CLINICAL DATA:  Weakness  EXAM: CHEST  2 VIEW  COMPARISON:  November 09, 2013  FINDINGS: There is no edema or consolidation. Heart size and pulmonary vascularity are normal. No adenopathy. There is degenerative change in the thoracic spine. There is a presumed bone infarct in the proximal right humerus, stable. There is evidence of superior migration of the right humeral head, suggesting chronic  rotator cuff tear.  IMPRESSION: No edema or consolidation. Apparent bone infarct right proximal humerus. Evidence of chronic rotator cuff tear in the right shoulder. Bones are osteoporotic.   Electronically Signed   By: Bretta Bang M.D.   On: 04/06/2014 09:26      MDM   Iv ns. Labs. Cxr.   Reviewed nursing notes and prior charts for additional history.   Given fever, cough, chest exam, feel pt w possible early pna.  No infiltrate on cxr. Pulse ox 97% room air.   Abx - rocephin and zithromax iv. Po fluids.   Pt tolerating po fluids.  Is awake and alert. Ms c/w baseline.   No sob or increased wob on recheck. Pulse ox 97% room air.  Pt indicates wants to go home.  Pt currently appears medically stable for d/c.  Was just given rx abx at home - will instruct to complete, and follow up closely with pcp.       Suzi Roots, MD 04/06/14 1254

## 2014-04-11 ENCOUNTER — Encounter (HOSPITAL_COMMUNITY): Payer: Self-pay | Admitting: Emergency Medicine

## 2014-04-11 ENCOUNTER — Inpatient Hospital Stay (HOSPITAL_COMMUNITY)
Admission: EM | Admit: 2014-04-11 | Discharge: 2014-04-13 | DRG: 194 | Disposition: A | Discharge status: Home / Self Care | Payer: Medicare Other | Attending: Internal Medicine | Admitting: Internal Medicine

## 2014-04-11 ENCOUNTER — Emergency Department (HOSPITAL_COMMUNITY): Payer: Medicare Other

## 2014-04-11 DIAGNOSIS — J189 Pneumonia, unspecified organism: Secondary | ICD-10-CM | POA: Diagnosis not present

## 2014-04-11 DIAGNOSIS — K5900 Constipation, unspecified: Secondary | ICD-10-CM | POA: Diagnosis present

## 2014-04-11 DIAGNOSIS — R059 Cough, unspecified: Secondary | ICD-10-CM | POA: Diagnosis not present

## 2014-04-11 DIAGNOSIS — Z87891 Personal history of nicotine dependence: Secondary | ICD-10-CM

## 2014-04-11 DIAGNOSIS — Z882 Allergy status to sulfonamides status: Secondary | ICD-10-CM | POA: Diagnosis not present

## 2014-04-11 DIAGNOSIS — J45909 Unspecified asthma, uncomplicated: Secondary | ICD-10-CM | POA: Diagnosis present

## 2014-04-11 DIAGNOSIS — I10 Essential (primary) hypertension: Secondary | ICD-10-CM | POA: Diagnosis present

## 2014-04-11 DIAGNOSIS — E119 Type 2 diabetes mellitus without complications: Secondary | ICD-10-CM | POA: Diagnosis present

## 2014-04-11 DIAGNOSIS — R079 Chest pain, unspecified: Secondary | ICD-10-CM | POA: Diagnosis present

## 2014-04-11 DIAGNOSIS — Z7982 Long term (current) use of aspirin: Secondary | ICD-10-CM | POA: Diagnosis not present

## 2014-04-11 DIAGNOSIS — E872 Acidosis, unspecified: Secondary | ICD-10-CM | POA: Diagnosis present

## 2014-04-11 DIAGNOSIS — J209 Acute bronchitis, unspecified: Secondary | ICD-10-CM | POA: Diagnosis present

## 2014-04-11 DIAGNOSIS — Z96659 Presence of unspecified artificial knee joint: Secondary | ICD-10-CM | POA: Diagnosis not present

## 2014-04-11 DIAGNOSIS — R05 Cough: Secondary | ICD-10-CM | POA: Diagnosis present

## 2014-04-11 DIAGNOSIS — E785 Hyperlipidemia, unspecified: Secondary | ICD-10-CM | POA: Diagnosis present

## 2014-04-11 HISTORY — DX: Chronic or unspecified peptic ulcer, site unspecified, with hemorrhage: K27.4

## 2014-04-11 HISTORY — DX: Chronic or unspecified gastrojejunal ulcer with hemorrhage: K28.4

## 2014-04-11 LAB — CBC WITH DIFFERENTIAL/PLATELET
BASOS PCT: 0 % (ref 0–1)
Basophils Absolute: 0 10*3/uL (ref 0.0–0.1)
Eosinophils Absolute: 0 10*3/uL (ref 0.0–0.7)
Eosinophils Relative: 0 % (ref 0–5)
HCT: 35.1 % — ABNORMAL LOW (ref 36.0–46.0)
HEMOGLOBIN: 11.5 g/dL — AB (ref 12.0–15.0)
LYMPHS PCT: 3 % — AB (ref 12–46)
Lymphs Abs: 0.3 10*3/uL — ABNORMAL LOW (ref 0.7–4.0)
MCH: 30.7 pg (ref 26.0–34.0)
MCHC: 32.8 g/dL (ref 30.0–36.0)
MCV: 93.9 fL (ref 78.0–100.0)
MONOS PCT: 7 % (ref 3–12)
Monocytes Absolute: 0.8 10*3/uL (ref 0.1–1.0)
NEUTROS ABS: 11.3 10*3/uL — AB (ref 1.7–7.7)
NEUTROS PCT: 90 % — AB (ref 43–77)
Platelets: 334 10*3/uL (ref 150–400)
RBC: 3.74 MIL/uL — AB (ref 3.87–5.11)
RDW: 12.5 % (ref 11.5–15.5)
WBC: 12.5 10*3/uL — AB (ref 4.0–10.5)

## 2014-04-11 LAB — TROPONIN I
Troponin I: <0.3 ng/mL (ref <0.30)
Troponin I: <0.3 ng/mL (ref <0.30)

## 2014-04-11 LAB — BASIC METABOLIC PANEL
ANION GAP: 18 — AB (ref 5–15)
BUN: 12 mg/dL (ref 6–23)
CO2: 20 meq/L (ref 19–32)
CREATININE: 0.72 mg/dL (ref 0.50–1.10)
Calcium: 10.2 mg/dL (ref 8.4–10.5)
Chloride: 101 mEq/L (ref 96–112)
GFR calc non Af Amer: 74 mL/min — ABNORMAL LOW (ref >90)
GFR, EST AFRICAN AMERICAN: 86 mL/min — AB (ref >90)
Glucose, Bld: 162 mg/dL — ABNORMAL HIGH (ref 70–99)
POTASSIUM: 3.4 meq/L — AB (ref 3.7–5.3)
SODIUM: 139 meq/L (ref 137–147)

## 2014-04-11 LAB — I-STAT TROPONIN, ED: TROPONIN I, POC: 0.01 ng/mL (ref 0.00–0.08)

## 2014-04-11 LAB — PRO B NATRIURETIC PEPTIDE: Pro B Natriuretic peptide (BNP): 191.5 pg/mL (ref 0–450)

## 2014-04-11 LAB — LACTIC ACID, PLASMA: Lactic Acid, Venous: 3.8 mmol/L — ABNORMAL HIGH (ref 0.5–2.2)

## 2014-04-11 MED ORDER — IPRATROPIUM-ALBUTEROL 0.5-2.5 (3) MG/3ML IN SOLN
3.0000 mL | Freq: Four times a day (QID) | RESPIRATORY_TRACT | Status: DC
Start: 2014-04-11 — End: 2014-04-11
  Administered 2014-04-11: 3 mL via RESPIRATORY_TRACT
  Filled 2014-04-11: qty 3

## 2014-04-11 MED ORDER — METOCLOPRAMIDE HCL 10 MG PO TABS
10.0000 mg | ORAL_TABLET | Freq: Every day | ORAL | Status: DC
  Administered 2014-04-12 – 2014-04-13 (×2): 10 mg via ORAL
  Filled 2014-04-11 (×3): qty 1

## 2014-04-11 MED ORDER — VANCOMYCIN HCL IN DEXTROSE 750-5 MG/150ML-% IV SOLN
750.0000 mg | INTRAVENOUS | Status: DC
  Administered 2014-04-12: 750 mg via INTRAVENOUS
  Filled 2014-04-11 (×2): qty 150

## 2014-04-11 MED ORDER — SODIUM CHLORIDE 0.9 % IV SOLN
INTRAVENOUS | Status: DC
Start: 2014-04-11 — End: 2014-04-11

## 2014-04-11 MED ORDER — VANCOMYCIN HCL IN DEXTROSE 1-5 GM/200ML-% IV SOLN
1000.0000 mg | Freq: Once | INTRAVENOUS | Status: AC
  Administered 2014-04-11: 1000 mg via INTRAVENOUS
  Filled 2014-04-11: qty 200

## 2014-04-11 MED ORDER — IPRATROPIUM-ALBUTEROL 0.5-2.5 (3) MG/3ML IN SOLN
3.0000 mL | Freq: Four times a day (QID) | RESPIRATORY_TRACT | Status: DC
  Administered 2014-04-11: 3 mL via RESPIRATORY_TRACT
  Filled 2014-04-11: qty 3

## 2014-04-11 MED ORDER — DEXTROSE 5 % IV SOLN
1.0000 g | Freq: Once | INTRAVENOUS | Status: AC
  Administered 2014-04-11: 1 g via INTRAVENOUS
  Filled 2014-04-11: qty 1

## 2014-04-11 MED ORDER — POTASSIUM CHLORIDE CRYS ER 20 MEQ PO TBCR
40.0000 meq | EXTENDED_RELEASE_TABLET | Freq: Once | ORAL | Status: AC
  Administered 2014-04-11: 40 meq via ORAL
  Filled 2014-04-11: qty 2

## 2014-04-11 MED ORDER — DEXTROSE 5 % IV SOLN
1.0000 g | INTRAVENOUS | Status: DC
  Administered 2014-04-12: 1 g via INTRAVENOUS
  Filled 2014-04-11 (×2): qty 1

## 2014-04-11 MED ORDER — ENOXAPARIN SODIUM 40 MG/0.4ML ~~LOC~~ SOLN: 40.0000 mg | SUBCUTANEOUS | Status: DC

## 2014-04-11 MED ORDER — GEMFIBROZIL 600 MG PO TABS
600.0000 mg | ORAL_TABLET | Freq: Two times a day (BID) | ORAL | Status: DC
  Administered 2014-04-11 – 2014-04-13 (×4): 600 mg via ORAL
  Filled 2014-04-11 (×5): qty 1

## 2014-04-11 MED ORDER — DEXTROSE 5 % IV SOLN: 1.0000 g | Freq: Three times a day (TID) | INTRAVENOUS | Status: DC

## 2014-04-11 MED ORDER — SODIUM CHLORIDE 0.9 % IV SOLN
INTRAVENOUS | Status: AC
  Administered 2014-04-11 – 2014-04-12 (×2): via INTRAVENOUS

## 2014-04-11 MED ORDER — PANTOPRAZOLE SODIUM 40 MG PO TBEC
80.0000 mg | DELAYED_RELEASE_TABLET | Freq: Every day | ORAL | Status: DC
  Administered 2014-04-12 – 2014-04-13 (×2): 80 mg via ORAL
  Filled 2014-04-11 (×2): qty 2

## 2014-04-11 MED ORDER — ASPIRIN EC 81 MG PO TBEC
81.0000 mg | DELAYED_RELEASE_TABLET | Freq: Every day | ORAL | Status: DC
  Administered 2014-04-12 – 2014-04-13 (×2): 81 mg via ORAL
  Filled 2014-04-11 (×3): qty 1

## 2014-04-11 MED ORDER — DOCUSATE SODIUM 100 MG PO CAPS
200.0000 mg | ORAL_CAPSULE | Freq: Every day | ORAL | Status: DC
  Administered 2014-04-11 – 2014-04-12 (×2): 200 mg via ORAL
  Filled 2014-04-11 (×3): qty 2

## 2014-04-11 MED ORDER — ALBUTEROL SULFATE (2.5 MG/3ML) 0.083% IN NEBU: 2.5000 mg | INHALATION_SOLUTION | RESPIRATORY_TRACT | Status: AC | PRN

## 2014-04-11 MED ORDER — GUAIFENESIN-DM 100-10 MG/5ML PO SYRP
5.0000 mL | ORAL_SOLUTION | ORAL | Status: DC | PRN
  Administered 2014-04-11 – 2014-04-13 (×3): 5 mL via ORAL
  Filled 2014-04-11 (×3): qty 5

## 2014-04-11 NOTE — ED Provider Notes (Signed)
CSN: [id number]     Arrival date & time 04/11/14  4098 History   First MD Initiated Contact with Patient 04/11/14 [phone]     Chief Complaint  Patient presents with  . Chest Pain      HPI Per EMS-family called out that pt was having chest pain due to holding her chest. Family states that pt is at baseline verbally. Pt does not answer much other than yes and no. Family gave 5  asprin. Upon EMS arrival pt has expiratory wheezing and received  albuterol. Family states that she is continuing to cough since recent visit on 9-18.  Past Medical History  Diagnosis Date  . Diabetes mellitus without complication   . Hypertension   . Asthma   . HLD (hyperlipidemia)   . Bleeding ulcer     requiring transfusions   Past Surgical History  Procedure Laterality Date  . Joint replacement    . Joint replacement    . Total knee arthroplasty Bilateral    Family History  Problem Relation Age of Onset  . Colon cancer Mother    History  Substance Use Topics  . Smoking status: Former Smoker    Types: Cigarettes  . Smokeless tobacco: Former Neurosurgeon  . Alcohol Use: No   OB History   Grav Para Term Preterm Abortions TAB SAB Ect Mult Living                 Review of Systems  Respiratory: Positive for cough and wheezing.   All other systems reviewed and are negative.     Allergies  Ciprofloxacin and Sulphur  Home Medications   Prior to Admission medications   Medication Sig Start Date End Date Taking? Authorizing Provider  acetaminophen (TYLENOL) 500 MG tablet Take 1,000 mg by mouth 2 (two) times daily as needed for mild pain.    Yes Historical Provider, MD  ADVAIR DISKUS 250-50 MCG/DOSE AEPB Inhale 1 puff into the lungs 2 (two) times daily.  09/27/13  Yes Historical Provider, MD  ALPRAZolam Prudy Feeler) 0.25 MG tablet Take 0.5 tablets by mouth at bedtime.  11/06/13  Yes Historical Provider, MD  aspirin EC 81 MG tablet Take 81 mg by mouth daily with breakfast.   Yes Historical Provider, MD   calcium-vitamin D (OSCAL WITH D) 500-200 MG-UNIT per tablet Take 1 tablet by mouth 2 (two) times daily.   Yes Historical Provider, MD  cetirizine (ZYRTEC) 10 MG tablet Take 10 mg by mouth daily as needed for allergies.   Yes Historical Provider, MD  Cranberry 1000 MG CAPS Take 2,000 mg by mouth daily with breakfast.    Yes Historical Provider, MD  dextromethorphan (DELSYM) 30 MG/5ML liquid Take 60 mg by mouth every 12 (twelve) hours as needed for cough.   Yes Historical Provider, MD  docusate sodium (COLACE) 100 MG capsule Take 200 mg by mouth at bedtime.   Yes Historical Provider, MD  doxycycline (VIBRA-TABS) 100 MG tablet Take 100 mg by mouth daily.   Yes Historical Provider, MD  esomeprazole (NEXIUM) 40 MG capsule Take 40 mg by mouth daily.    Yes Historical Provider, MD  gemfibrozil (LOPID) 600 MG tablet Take 600 mg by mouth 2 (two) times daily.  10/23/13  Yes Historical Provider, MD  guaiFENesin (MUCINEX) 600 MG 12 hr tablet Take 600 mg by mouth 2 (two) times daily as needed for cough or to loosen phlegm.   Yes Historical Provider, MD  HYDROcodone-homatropine (HYCODAN) 5-1.5 MG/5ML syrup Take 5 mLs by mouth  every 6 (six) hours as needed for cough.   Yes Historical Provider, MD  metoCLOPramide (REGLAN) 10 MG tablet Take 10 mg by mouth daily with breakfast.    Yes Historical Provider, MD  Multiple Vitamin (MULTIVITAMIN WITH MINERALS) TABS tablet Take 1 tablet by mouth daily.   Yes Historical Provider, MD  polyethylene glycol (MIRALAX / GLYCOLAX) packet Take 17 g by mouth daily as needed for mild constipation.   Yes Historical Provider, MD  Probiotic Product (PROBIOTIC PO) Take 1 capsule by mouth daily.   Yes Historical Provider, MD  tetrahydrozoline (VISINE) 0.05 % ophthalmic solution Place 1 drop into both eyes daily as needed (for dry eyes).   Yes Historical Provider, MD  vitamin B-12 (CYANOCOBALAMIN) 1000 MCG tablet Take 1,000 mcg by mouth daily with breakfast.    Yes Historical Provider, MD    BP 107/53  Pulse 94  Temp(Src) 98.8 F (37.1 C) (Oral)  Resp 18  Ht  (1.702 m)  Wt 125 lb 14.4 oz (57.108 kg)  BMI 19.71 kg/m2  SpO2 98% Physical Exam  Nursing note and vitals reviewed. Constitutional: She is oriented to person, place, and time. She appears well-developed and well-nourished. No distress.  HENT:  Head: Normocephalic and atraumatic.  Eyes: Pupils are equal, round, and reactive to light.  Neck: Normal range of motion.  Cardiovascular: Normal rate and intact distal pulses.   Pulmonary/Chest: She has wheezes.  Abdominal: Normal appearance. She exhibits no distension.  Musculoskeletal: Normal range of motion.  Neurological: She is alert and oriented to person, place, and time. No cranial nerve deficit.  Skin: Skin is warm and dry. No rash noted.  Psychiatric: She has a normal mood and affect. Her behavior is normal.    ED Course  Procedures (including critical care time) Labs Review Labs Reviewed  BASIC METABOLIC PANEL - Abnormal; Notable for the following:    Potassium 3.4 (*)    Glucose, Bld 162 (*)    GFR calc non Af Amer 74 (*)    GFR calc Af Amer 86 (*)    Anion gap 18 (*)    All other components within normal limits  CBC WITH DIFFERENTIAL - Abnormal; Notable for the following:    WBC 12.5 (*)    RBC 3.74 (*)    Hemoglobin 11.5 (*)    HCT 35.1 (*)    Neutrophils Relative % 90 (*)    Neutro Abs 11.3 (*)    Lymphocytes Relative 3 (*)    Lymphs Abs 0.3 (*)    All other components within normal limits  LACTIC ACID, PLASMA - Abnormal; Notable for the following:    Lactic Acid, Venous 3.8 (*)    All other components within normal limits  CULTURE, BLOOD (ROUTINE X 2)  CULTURE, BLOOD (ROUTINE X 2)  CULTURE, EXPECTORATED SPUTUM-ASSESSMENT  GRAM STAIN  PRO B NATRIURETIC PEPTIDE  TROPONIN I  TROPONIN I  TROPONIN I  LEGIONELLA ANTIGEN, URINE  STREP PNEUMONIAE URINARY ANTIGEN  CBC WITH DIFFERENTIAL  BASIC METABOLIC PANEL  LACTIC ACID, PLASMA   I-STAT TROPOININ, ED    Imaging Review Dg Chest 1 View  04/11/2014   CLINICAL DATA:  Cough, weakness, confusion  EXAM: CHEST - 1 VIEW  COMPARISON:  04/06/2014  FINDINGS: Cardiomediastinal silhouette is stable. No pulmonary edema. Left basilar atelectasis or infiltrate. Stable sclerotic changes proximal right humerus.  IMPRESSION: Left basilar atelectasis or infiltrate.  No pulmonary edema.   Electronically Signed   By: Lanette Hampshire.D.  On: 04/11/2014 09:42     Date: 04/11/2014  Rate:85  Rhythm: normal sinus rhythm  QRS Axis: normal  Intervals: normal  ST/T Wave abnormalities: normal  Conduction Disutrbances: none       MDM   Final diagnoses:  Community acquired pneumonia        Nelia Shi, MD 04/11/14 2044

## 2014-04-11 NOTE — Progress Notes (Signed)
Utilization review completed.  

## 2014-04-11 NOTE — ED Notes (Signed)
MD at bedside. 

## 2014-04-11 NOTE — Progress Notes (Signed)
ANTIBIOTIC CONSULT NOTE - INITIAL  Pharmacy Consult:  Vancomycin / Cefepime Indication:  HCAP  Allergies  Allergen Reactions  . Ciprofloxacin Hives  . Sulphur [Sulfur] Anaphylaxis    Patient Measurements: April 2015 data:  Weight 58.5 kg, Height = 170 cm  Vital Signs: Temp: 100.6 F (38.1 C) (09/23 1230) Temp src: Oral (09/23 1230) BP: 107/61 mmHg (09/23 1215) Pulse Rate: 95 (09/23 1215)  Labs:  Recent Labs  04/11/14 1010  WBC 12.5*  HGB 11.5*  PLT 334  CREATININE 0.72   The CrCl is unknown because both a height and weight (above a minimum accepted value) are required for this calculation. No results found for this basename: VANCOTROUGH, VANCOPEAK, VANCORANDOM, GENTTROUGH, GENTPEAK, GENTRANDOM, TOBRATROUGH, TOBRAPEAK, TOBRARND, AMIKACINPEAK, AMIKACINTROU, AMIKACIN,  in the last 72 hours   Microbiology: No results found for this or any previous visit (from the past 720 hour(s)).  Medical History: Past Medical History  Diagnosis Date  . Diabetes mellitus without complication   . Hypertension   . Asthma   . HLD (hyperlipidemia)      Assessment: 89 YOF to start vancomycin and cefepime for HCAP.  CXR showed left basilar atelectasis vs infiltrate.  Baseline labs reviewed.  Doxy PTA Vanc 9/23 >> Cefepime 9/23 >>   Goal of Therapy:  Vancomycin trough level 15-20 mcg/ml   Plan:  - Vanc 1gm IV x 1, then  IV Q24H - Cefepime 1gm IV Q24H - Monitor renal fxn, clinical progress, vanc trough as indicated - F/U KCL supplementation and updated height and weight    Perkins Molina D. Laney Potash, PharmD, BCPS Pager:  [phone] 04/11/2014, 12:58 PM

## 2014-04-11 NOTE — Progress Notes (Signed)
Went to collect sputum from pt. Pt has weak dry non-productive cough. BBS are clear; diminished. Specimen container left at bedside.

## 2014-04-11 NOTE — Plan of Care (Signed)
Problem: Phase I Progression Outcomes Goal: First antibiotic given within 6hrs of admit Outcome: Not Met (add Reason) Antibiotics not given in the ED and was awaiting blood cultures to be drawn prior to antibiotics administration

## 2014-04-11 NOTE — ED Notes (Signed)
Per EMS-family called out that pt was having chest pain due to holding her chest. Family states that pt is at baseline verbally. Pt does not answer much other than yes and no.   Family gave 5  asprin. Upon EMS arrival pt has expiratory wheezing and received  albuterol. Family states that she is continuing to cough since recent visit on 9-18. 124/66 HR88. resp 20

## 2014-04-11 NOTE — H&P (Signed)
Date: 04/11/2014               Patient Name:  Wendy Vasquez MRN: [id number]  DOB: 1923-12-10 Age / Sex: 80 y.o., female   PCP: Barbie Banner, MD              Medical Service: Internal Medicine Teaching Service              Attending Physician: Dr. Burns Spain, MD    First Contact: Dr. Leatha Gilding  Pager: [phone]  Second Contact: Dr. Sherrine Maples Pager: [phone]            After Hours (After 5p/  First Contact Pager: [phone]  weekends / holidays): Second Contact Pager: [phone]   Chief Complaint: Cough, and chest pain for a week  History of Present Illness: (The history is provided by her daughter as the patient is minimally interactive. Responds to questions with yes/no and nonverbal).   She a 80 year old woman, with past medical history of hypertension, asthma, hyperlipidemia, and generalized deconditioning, who presents to the emergency department, with history of cough, and chest pain for about a week. Cough is productive of yellow, and sometimes green sputum, without streaks of blood. She also reports progressive SOB. Patient also reportedly has been experiencing wheezing which family has attribute to asthma.  Patient was seen in [facility] department on 04/06/2014 where she was treated with IV Azithromycin and Rocephin x1. She was discharged from the ED, with a diagnosis of ?acute viral bronchitis and no antibiotics were prescribed. On arrival to home daughter contacted PCP and a prescription of doxycycline was called in. Patient has been taking doxycycline consistently for about 5 days, but symptoms have worsened with increasing cough.  Prior to the doxycycline, she had been prescribed Augmentin ? for a UTI as per the patient's daughter. On the morning of admission, patient complained of chest pain, and EMS was called. She was treated with 5 pills of 81 mg of Aspirin per recommendation by EMS and presented to the ED. She also received nebulizing treatment by EMS with some good  response. Otherwise, according to the daughter, the patient's appetite has been good, even though she has been generally fatigued and subjective fevers. However, daughter reports that she feels hot. No loss of consciousness.  Patient is generally deconditioned and requires a lot of assistance from her daughter for most activities of daily living. She lives at home.  Review of Systems: HEENT: Denies photophobia, eye pain, redness, hearing loss, ear pain, congestion, sore throat, rhinorrhea, sneezing, mouth sores, trouble swallowing, neck pain, neck stiffness and tinnitus.  Cardiovascular: Denies chest pain, palpitations and leg swelling.  Gastrointestinal: Denies nausea, vomiting, abdominal pain, diarrhea, constipation,blood in stool and abdominal distention.  Genitourinary: Denies dysuria, urgency, frequency, hematuria, flank pain and difficulty urinating.  Musculoskeletal: Denies myalgias, back pain, joint swelling, arthralgias and gait problem.  Skin: Denies pallor, rash and wound.  Neurological: Denies dizziness, seizures, syncope, weakness, lightheadedness, numbness and headaches.  Hematological: Denies adenopathy. Easy bruising, personal or family bleeding history  Psychiatric/Behavioral: Denies suicidal ideation, mood changes, confusion, nervousness, sleep disturbance and agitation  Meds: No current facility-administered medications for this encounter.   Current Outpatient Prescriptions  Medication Sig Dispense Refill  . acetaminophen (TYLENOL) 500 MG tablet Take 1,000 mg by mouth 2 (two) times daily as needed for mild pain.       Marland Kitchen ADVAIR DISKUS 250-50 MCG/DOSE AEPB Inhale 1 puff into the lungs 2 (two) times daily.       Marland Kitchen  ALPRAZolam (XANAX) 0.25 MG tablet Take 0.5 tablets by mouth at bedtime.       Marland Kitchen aspirin EC 81 MG tablet Take 81 mg by mouth daily with breakfast.      . calcium-vitamin D (OSCAL WITH D) 500-200 MG-UNIT per tablet Take 1 tablet by mouth 2 (two) times daily.      .  cetirizine (ZYRTEC) 10 MG tablet Take 10 mg by mouth daily as needed for allergies.      . Cranberry 1000 MG CAPS Take 2,000 mg by mouth daily with breakfast.       . dextromethorphan (DELSYM) 30 MG/5ML liquid Take 60 mg by mouth every 12 (twelve) hours as needed for cough.      . docusate sodium (COLACE) 100 MG capsule Take 200 mg by mouth at bedtime.      Marland Kitchen doxycycline (VIBRA-TABS) 100 MG tablet Take 100 mg by mouth daily.      Marland Kitchen esomeprazole (NEXIUM) 40 MG capsule Take 40 mg by mouth daily.       Marland Kitchen gemfibrozil (LOPID) 600 MG tablet Take 600 mg by mouth 2 (two) times daily.       Marland Kitchen guaiFENesin (MUCINEX) 600 MG 12 hr tablet Take 600 mg by mouth 2 (two) times daily as needed for cough or to loosen phlegm.      Marland Kitchen HYDROcodone-homatropine (HYCODAN) 5-1.5 MG/5ML syrup Take 5 mLs by mouth every 6 (six) hours as needed for cough.      . metoCLOPramide (REGLAN) 10 MG tablet Take 10 mg by mouth daily with breakfast.       . Multiple Vitamin (MULTIVITAMIN WITH MINERALS) TABS tablet Take 1 tablet by mouth daily.      . polyethylene glycol (MIRALAX / GLYCOLAX) packet Take 17 g by mouth daily as needed for mild constipation.      . Probiotic Product (PROBIOTIC PO) Take 1 capsule by mouth daily.      Marland Kitchen tetrahydrozoline (VISINE) 0.05 % ophthalmic solution Place 1 drop into both eyes daily as needed (for dry eyes).      . vitamin B-12 (CYANOCOBALAMIN) 1000 MCG tablet Take 1,000 mcg by mouth daily with breakfast.         Allergies: Allergies as of 04/11/2014 - Review Complete 04/11/2014  Allergen Reaction Noted  . Ciprofloxacin Hives 04/06/2014  . Sulphur [sulfur] Anaphylaxis 11/09/2013   Past Medical History  Diagnosis Date  . Diabetes mellitus without complication   . Hypertension   . Asthma   . HLD (hyperlipidemia)    Past Surgical History  Procedure Laterality Date  . Joint replacement    . Joint replacement     Family History  Problem Relation Age of Onset  . Colon cancer Mother     History   Social History  . Marital Status: Widowed    Spouse Name: N/A    Number of Children: N/A  . Years of Education: N/A   Occupational History  . Not on file.   Social History Main Topics  . Smoking status: Former Smoker    Types: Cigarettes  . Smokeless tobacco: Former Neurosurgeon  . Alcohol Use: No  . Drug Use: No  . Sexual Activity: No   Other Topics Concern  . Not on file   Social History Narrative  . No narrative on file      Physical Exam: Blood pressure 107/61, pulse 95, temperature 100.6 F (38.1 C), temperature source Oral, resp. rate 21, SpO2 98.00%.  General: Appears fatigued, elderly/frail woman.  well developed, well nourished; no acute distressed, cooperative with exam Head: atraumatic, normocephalic,  Eye: pupils equal, round an appears fatigued, elderly woman d reactive; sclera anicteric; normal conjunctiva  Nose/throat: oropharynx clear, moist mucous membranes, pink gums  Lungs/Chest wall:  occasional crackles, versus wheezes bilaterally. Attempts to cough, but has a weak cough effort  Heart: normal rate and regular rhythm; no murmurs Pulses: radial and dorsalis pedis pulses are 2+ and symmetric  Abdomen: Normal fullness, no rebound, guarding, or rigidity; normal bowel sounds; no masses or organomegaly  Skin: warm, dry, intact, normal turgor, no rashes  Extremities: no peripheral edema, clubbing, or cyanosis Neurologic: A&O X3, CN II - XII are grossly intact. Motor strength is 5/5 in the all 4 extremities, Sensations intact to light touch Psych:  does not respond to questions very well but is oriented to place, and person.   Lab results: Basic Metabolic Panel:  Recent Labs  16/10/96 1010  NA 139  K 3.4*  CL 101  CO2 20  GLUCOSE 162*  BUN 12  CREATININE 0.72  CALCIUM 10.2   CBC:  Recent Labs  04/11/14 1010  WBC 12.5*  NEUTROABS 11.3*  HGB 11.5*  HCT 35.1*  MCV 93.9  PLT 334  Urine Drug Screen:  Imaging results:  Dg Chest 1  View  04/11/2014   CLINICAL DATA:  Cough, weakness, confusion  EXAM: CHEST - 1 VIEW  COMPARISON:  04/06/2014  FINDINGS: Cardiomediastinal silhouette is stable. No pulmonary edema. Left basilar atelectasis or infiltrate. Stable sclerotic changes proximal right humerus.  IMPRESSION: Left basilar atelectasis or infiltrate.  No pulmonary edema.   Electronically Signed   By: Natasha Mead M.D.   On: 04/11/2014 09:42    Other results: EKG: normal EKG, normal sinus rhythm. No acute ischemic changes   Assessment & Plan by Problem: Principal Problem:   HCAP (healthcare-associated pneumonia) Active Problems:   HTN (hypertension)   Asthma  81 year old woman, with past medical history of hypertension, and hyperlipidemia, presents with mild fevers with persistent cough, and chest pain. Failed outpatient antibiotic therapy with doxycycline.  HCAP: Patient's presentation consistent with pneumonia. Chest x-ray reveals left lower lobe atelectasis versus infiltrate. Recent received  outpatient Augmentin and doxycycline. Noted to have a mild fever of 100.6, and a leukocytosis of 12.5 in addition to left shift on the differential. Otherwise, hemodynamically stable, with oxygen saturation of 98% on room air.  Plan. -Admit telemetry. - 2 view CXR -DuoNebs every 6 hours scheduled. -Vancomycin, and cefepime for treatment as HCAP. -Supplement her oxygen as needed. -Check blood cultures. -Urine Strep and Legionella antigen. -Sputum cultures. - tessalon for cough -Lactic acid level  Increased anion gap metabolic acidosis: Anion gap noted to be 18 up from 14 5 days ago. Likely acidosis versus lactic in setting of acute illness. Patient received 5 pills of ASA due to chest pain before presentation to ED.  Plan  - Gentle IV rehydration with IV normal saline at 100 cc per hour for 12 hrs - check BMET in a.m - check lactic acid level  - consider further w/u if acidosis does not improve  Chest pain: Patient reported  chest pain. When EMS was called. Currently, chest pain-free. Received 5 pills of aspirin. Prior to arrival to emergency department. En route, troponin was negative. Likely this is related to the coughing, with a pneumonia as in problem #1 above. Low suspicion for cardiac ischemia. Plan  - telemetry  - check EKG  - monitor trops  F/E/N: Regular  diet. Supplement potassium  DVT prophylaxis: Lovenox subQ  CODE STATUS: Full code.  Dispo: Disposition is deferred at this time, awaiting improvement of current medical problems. Anticipated discharge in approximately 2-3 day(s).   The patient does have a current PCP Barbie Banner, MD), therefore is not require OPC follow-up after discharge.   The patient does not have transportation limitations that hinder transportation to clinic appointments.   Signed:  Dow Adolph, MD PGY-3 Internal Medicine Teaching Service Pager: [phone] (7pm-7am) 04/11/2014, 12:45 PM

## 2014-04-12 ENCOUNTER — Inpatient Hospital Stay (HOSPITAL_COMMUNITY): Payer: Medicare Other

## 2014-04-12 DIAGNOSIS — R82998 Other abnormal findings in urine: Secondary | ICD-10-CM

## 2014-04-12 DIAGNOSIS — J189 Pneumonia, unspecified organism: Principal | ICD-10-CM

## 2014-04-12 DIAGNOSIS — R079 Chest pain, unspecified: Secondary | ICD-10-CM

## 2014-04-12 LAB — CBC WITH DIFFERENTIAL/PLATELET
BASOS ABS: 0 10*3/uL (ref 0.0–0.1)
BASOS PCT: 0 % (ref 0–1)
EOS ABS: 0 10*3/uL (ref 0.0–0.7)
Eosinophils Relative: 0 % (ref 0–5)
HCT: 30.4 % — ABNORMAL LOW (ref 36.0–46.0)
Hemoglobin: 10.1 g/dL — ABNORMAL LOW (ref 12.0–15.0)
Lymphocytes Relative: 14 % (ref 12–46)
Lymphs Abs: 1.5 10*3/uL (ref 0.7–4.0)
MCH: 31 pg (ref 26.0–34.0)
MCHC: 33.2 g/dL (ref 30.0–36.0)
MCV: 93.3 fL (ref 78.0–100.0)
Monocytes Absolute: 1.3 10*3/uL — ABNORMAL HIGH (ref 0.1–1.0)
Monocytes Relative: 12 % (ref 3–12)
NEUTROS ABS: 7.7 10*3/uL (ref 1.7–7.7)
NEUTROS PCT: 74 % (ref 43–77)
PLATELETS: 310 10*3/uL (ref 150–400)
RBC: 3.26 MIL/uL — ABNORMAL LOW (ref 3.87–5.11)
RDW: 12.7 % (ref 11.5–15.5)
WBC: 10.5 10*3/uL (ref 4.0–10.5)

## 2014-04-12 LAB — BASIC METABOLIC PANEL
ANION GAP: 12 (ref 5–15)
BUN: 15 mg/dL (ref 6–23)
CO2: 20 mEq/L (ref 19–32)
Calcium: 10 mg/dL (ref 8.4–10.5)
Chloride: 106 mEq/L (ref 96–112)
Creatinine, Ser: 0.8 mg/dL (ref 0.50–1.10)
GFR calc Af Amer: 74 mL/min — ABNORMAL LOW (ref >90)
GFR, EST NON AFRICAN AMERICAN: 64 mL/min — AB (ref >90)
Glucose, Bld: 116 mg/dL — ABNORMAL HIGH (ref 70–99)
POTASSIUM: 4.9 meq/L (ref 3.7–5.3)
SODIUM: 138 meq/L (ref 137–147)

## 2014-04-12 LAB — TROPONIN I

## 2014-04-12 LAB — LACTIC ACID, PLASMA: LACTIC ACID, VENOUS: 1.3 mmol/L (ref 0.5–2.2)

## 2014-04-12 MED ORDER — IPRATROPIUM-ALBUTEROL 0.5-2.5 (3) MG/3ML IN SOLN
3.0000 mL | Freq: Three times a day (TID) | RESPIRATORY_TRACT | Status: DC
  Administered 2014-04-12 – 2014-04-13 (×5): 3 mL via RESPIRATORY_TRACT
  Filled 2014-04-12 (×6): qty 3

## 2014-04-12 MED ORDER — MENTHOL 3 MG MT LOZG
1.0000 | LOZENGE | OROMUCOSAL | Status: DC | PRN
  Administered 2014-04-13: 3 mg via ORAL
  Filled 2014-04-12: qty 9

## 2014-04-12 MED ORDER — ACETAMINOPHEN 500 MG PO TABS
1000.0000 mg | ORAL_TABLET | Freq: Two times a day (BID) | ORAL | Status: DC
  Administered 2014-04-12 – 2014-04-13 (×2): 1000 mg via ORAL
  Filled 2014-04-12 (×4): qty 2

## 2014-04-12 NOTE — Progress Notes (Signed)
Subjective: On initial visit, patient complained that her feet hurt.  History provided by Ms. Papania's daughter and primary care giver. To the daughter [name]), she looks much better today and is coughing less frequently. She is frustrated that [facility] sent her mother home without any treatment. Daughter is concerned about taking Ms. Peaden home unless she continues to improve.  Objective: Vital signs in last 24 hours: Filed Vitals:   04/11/14 1928 04/11/14 1956 04/12/14 0300 04/12/14 0824  BP: 107/53  117/55   Pulse: 84 94 79   Temp: 98.8 F (37.1 C)  99.7 F (37.6 C)   TempSrc: Oral  Oral   Resp: Height:      Weight:   132 lb 8 oz (60.102 kg)   SpO2: 96% 98% 96% 96%   Weight change:   Intake/Output Summary (Last 24 hours) at 04/12/14 1350 Last data filed at 04/11/14 2000  Gross per 24 hour  Intake 1012.5 ml  Output      0 ml  Net 1012.5 ml   Physical Exam: Appearance: frail woman, appears uncomfortable HEENT: AT/, dry mucous membranes, EOMi, PERRL Heart: RRR, normal S1S2 Lungs: crackles R>L Abdomen: BS+, soft, nontender Extremities: no edema in lower extremities, DPs strong b/l, no lesions, ulcers, bruising or other signs of injury or pain Neurologic: A&Ox 1 or 2 (states she is in "emergency" and knows her name. States that the date is "58.Marland Kitchen..") Skin: no lesions or rashes  Lab Results: Basic Metabolic Panel:  Recent Labs Lab 04/11/14 1010 04/12/14 0600  NA 139 138  K 3.4* 4.9  CL 101 106  CO2 20 20  GLUCOSE 162* 116*  BUN 12 15  CREATININE 0.72 0.80  CALCIUM 10.2 10.0   Liver Function Tests:  Recent Labs Lab 04/06/14 0912  AST 20  ALT 8  ALKPHOS 137*  BILITOT 0.6  PROT 7.8  ALBUMIN 3.7   CBC:  Recent Labs Lab 04/11/14 1010 04/12/14 0600  WBC 12.5* 10.5  NEUTROABS 11.3* 7.7  HGB 11.5* 10.1*  HCT 35.1* 30.4*  MCV 93.9 93.3  PLT 334 310   Cardiac Enzymes:  Recent Labs Lab 04/11/14 1420 04/11/14 1836 04/12/14 0136    TROPONINI <0.30 <0.30 <0.30   BNP:  Recent Labs Lab 04/11/14 1010  PROBNP 191.5    Urinalysis:  Recent Labs Lab 04/06/14 1003  COLORURINE YELLOW  LABSPEC 1.016  PHURINE 6.5  GLUCOSEU NEGATIVE  HGBUR LARGE*  BILIRUBINUR NEGATIVE  KETONESUR NEGATIVE  PROTEINUR NEGATIVE  UROBILINOGEN 0.2  NITRITE NEGATIVE  LEUKOCYTESUR NEGATIVE    Micro Results: Recent Results (from the past 240 hour(s))  CULTURE, BLOOD (ROUTINE X 2)     Status: None   Collection Time    04/11/14  2:10 PM      Result Value Ref Range Status   Specimen Description BLOOD LEFT HAND   Final   Special Requests BOTTLES DRAWN AEROBIC AND ANAEROBIC 5CC   Final   Culture  Setup Time     Final   Value: 04/11/2014 22:36     Performed at [facility]   Culture     Final   Value:        BLOOD CULTURE RECEIVED NO GROWTH TO DATE CULTURE WILL BE HELD FOR 5 DAYS BEFORE ISSUING A FINAL NEGATIVE REPORT     Performed at [facility]   Report Status PENDING   Incomplete  CULTURE, BLOOD (ROUTINE X 2)     Status: None  Collection Time    04/11/14  2:20 PM      Result Value Ref Range Status   Specimen Description BLOOD RIGHT HAND   Final   Special Requests BOTTLES DRAWN AEROBIC ONLY 8CC   Final   Culture  Setup Time     Final   Value: 04/11/2014 22:35     Performed at [facility]   Culture     Final   Value:        BLOOD CULTURE RECEIVED NO GROWTH TO DATE CULTURE WILL BE HELD FOR 5 DAYS BEFORE ISSUING A FINAL NEGATIVE REPORT     Performed at [facility]   Report Status PENDING   Incomplete   Studies/Results: Dg Chest 1 View  04/11/2014   CLINICAL DATA:  Cough, weakness, confusion  EXAM: CHEST - 1 VIEW  COMPARISON:  04/06/2014  FINDINGS: Cardiomediastinal silhouette is stable. No pulmonary edema. Left basilar atelectasis or infiltrate. Stable sclerotic changes proximal right humerus.  IMPRESSION: Left basilar atelectasis or infiltrate.  No pulmonary edema.   Electronically Signed    By: Natasha Mead M.D.   On: 04/11/2014 09:42   Dg Chest 2 View  04/12/2014   CLINICAL DATA:  Pneumonia.  EXAM: CHEST  2 VIEW  COMPARISON:  04/11/2014.  FINDINGS: Mediastinum and hilar structures are normal. Heart size normal. Persistent mild left basilar atelectasis and or infiltrate. Lungs otherwise clear. No pneumothorax. 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 Degenerative changes both shoulders and thoracic spine.  IMPRESSION: Stable mild left base subsegmental atelectasis and or infiltrate could   Electronically Signed   By: Maisie Fus  Register   On: 04/12/2014 07:47   Medications: I have reviewed the patient's current medications. Scheduled Meds: . aspirin EC  81 mg Oral Q breakfast  . ceFEPime (MAXIPIME) IV  1 g Intravenous Q24H  . docusate sodium  200 mg Oral QHS  . gemfibrozil  600 mg Oral BID  . ipratropium-albuterol  3 mL Nebulization TID  . metoCLOPramide  10 mg Oral Q breakfast  . pantoprazole  80 mg Oral Q1200  . vancomycin  750 mg Intravenous Q24H   Continuous Infusions:  PRN Meds:.guaiFENesin-dextromethorphan Assessment/Plan: Principal Problem:   HCAP (healthcare-associated pneumonia) Active Problems:   HTN (hypertension)   Asthma   CAP (community acquired pneumonia)   Chest pain 80 year old woman, with past medical history of hypertension, and hyperlipidemia, presents with mild fevers with persistent cough, and chest pain. Failed outpatient antibiotic therapy with doxycycline. Better today.  HCAP: Patient's presentation consistent with pneumonia. Chest x-ray revealed left lower lobe atelectasis or possible infiltrate (confirmed with radiology). Recently received outpatient augmentin for UTI and 4 days of a course of doxycycline for present symptoms. Was noted to have a mild fever of 100.6, now afebrile; had leukocytosis to 12.5, now 10.5 with a left shift. Had elevated lactic acid that resolved overnight with hydration. Satting 98% on room  air and has less of a cough than yesterday. - D/c tele - Continue DuoNebs every 6 hours scheduled - Vancomycin, and cefepime for treatment of HCAP - Blood cultures NGTD - Urine Strep and Legionella antigen pending - Sputum cultures pending - Tessalon for cough and cepacol for sore throat (requested by daughter) - Lactic acid level resolved   Urine Culture + for e.coli: with negative UTI - Likely colonized; will ask daughter specifically about symptoms tomorrow; if she is experiencing symptoms, will treat  Resolved anion gap metabolic acidosis: Anion gap noted to be  18 up from 14 5 days ago, now resolved. Likely occurred in setting of acute illness.  - D/c IV rehydration   Chest pain: Patient reported chest pain, but no longer has chest pain. As per daughter, the pain occurred with coughing. Her negative troponins x3 and negative EKG also make ACS less likely.  Diet:  - Regular diet (she eats regular food at home) - Supplement potassium   DVT prophylaxis:  - Lovenox subQ  Dispo: Disposition is deferred at this time, awaiting improvement of current medical problems.  Anticipated discharge in approximately 1 day(s).   The patient does have a current PCP Barbie Banner, MD) and does not need an [facility] hospital follow-up appointment after discharge.  The patient does have transportation limitations that hinder transportation to clinic appointments.  .Services Needed at time of discharge: Y = Yes, Blank = No PT:   OT:   RN:   Equipment:   Other:     LOS: 1 day   Dionne Ano, MD 04/12/2014, 1:50 PM

## 2014-04-12 NOTE — Progress Notes (Signed)
  Date: 04/12/2014  Patient name: Wendy Vasquez  Medical record number: [id number]  Date of birth: [birth year]   I have seen and evaluated Wendy Vasquez and discussed their care with the Residency Team. The history is obtained from Dr Huntley Dec H&P who obtained hx from her daughter as the patient is minimally interactive.  Wendy Vasquez is an 80 year old woman, with past medical history of hypertension, asthma, hyperlipidemia, and generalized deconditioning, who presents to the emergency department, with history of cough, and chest pain for about a week. Cough is productive of yellow, and sometimes green sputum, without streaks of blood. She also reports progressive SOB. Patient also reportedly has been experiencing wheezing which family has attribute to asthma. Patient was seen in [place] department on 04/06/2014 where she was treated with IV Azithromycin and Rocephin x1. She was discharged from the ED, with a diagnosis of ?acute viral bronchitis and no antibiotics were prescribed. On arrival to home daughter contacted PCP and a prescription of doxycycline was called in. Patient has been taking doxycycline consistently for about 5 days, but symptoms have worsened with increasing cough. Prior to the doxycycline, she had been prescribed Augmentin ? for a UTI as per the patient's daughter. On the morning of admission, patient complained of chest pain, and EMS was called. She was treated with 5 pills of 81 mg of Aspirin per recommendation by EMS and presented to the ED. She also received nebulizing treatment by EMS with some good response. Otherwise, according to the daughter, the patient's appetite has been good, even though she has been generally fatigued and subjective fevers. However, daughter reports that she feels hot. No loss of consciousness.   Patient is generally deconditioned and requires a lot of assistance from her daughter for most activities of daily living. She lives at home.  Today, she  is lying in bed in NAD. She was only able to tell us that she had pain in her feet B. HRRR no MRG. Lungs decent air flow. Crackles on mid R lung > L lung. Ext no edema. Feet warm. + 2 DP pulse B. No pain to palp nor passive ROM.   Assessment and Plan: I have seen and evaluated the patient as outlined above. I agree with the formulated Assessment and Plan as detailed in the residents' admission note, with the following changes:   1. Presumed HCAP and anion gap metabolic acidosis - Pt's lactic acid and WBC responded well to IVF and ABX (vanc and cefepime). She is also getting nebs. Cont IV ABX but stop IVF and watch oral intake. Talk with daughter about dispo plans once she is medically stable.   2. CP on presentation - Trop are nl and EKG nl. Will not pursue further W/U.   Burns Spain, MD 9/24/20152:23 PM

## 2014-04-13 MED ORDER — HYDROCOD POLST-CHLORPHEN POLST 10-8 MG/5ML PO LQCR: 5.0000 mL | Freq: Every evening | ORAL | Status: DC | PRN

## 2014-04-13 MED ORDER — AMOXICILLIN-POT CLAVULANATE 500-125 MG PO TABS: 1.0000 | ORAL_TABLET | Freq: Two times a day (BID) | ORAL | Status: DC

## 2014-04-13 NOTE — Plan of Care (Signed)
Problem: Phase II Progression Outcomes Goal: Wean O2 if indicated Outcome: Completed/Met Date Met:  04/13/14 Patient is on room air with sats in the upper 90s.  No acute events occurred this shift.  Will continue to monitor patient condition.

## 2014-04-13 NOTE — Progress Notes (Signed)
Clinical Social Work Department BRIEF PSYCHOSOCIAL ASSESSMENT 04/13/2014  Patient:  NINNIE, FEIN     Account Number:  [id number]     Admit date:  04/11/2014  Clinical Social Worker:  Adair Laundry  Date/Time:  04/13/2014 11:45 AM  Referred by:  Physician  Date Referred:  04/13/2014 Referred for  SNF Placement   Other Referral:   Interview type:  Family Other interview type:   Spoke with pt daughter at bedside    PSYCHOSOCIAL DATA Living Status:  WITH ADULT CHILDREN Admitted from facility:   Level of care:   Primary support name:  Biomedical engineer Primary support relationship to patient:  CHILD, ADULT Degree of support available:   Pt has strong family support    CURRENT CONCERNS Current Concerns  Post-Acute Placement   Other Concerns:    SOCIAL WORK ASSESSMENT / PLAN CSW received call from pt daughter asking to please speak about SNF. CSW planned to meet with pt family at 2pm today, but notified by [facility] that pt is ready for dc today. CSW met with pt daughter who was at bedside. CSW introduced self and explained role. Pt daughter informed CSW that she was hoping her cousin could be here for conversation. CSW explained that CSW would gladly come back later however since pt is ready for dc today CSW needs to start referral process and insurance authorization as soon as possible. Pt daughter understanding of this and agreeable. Pt daughter did have hesitations toward sending referral to facilities other than [facility] which is their preference. After CSW explained need for backup, pt daughter agreeable to referral being sent to all [facility]. Pt daughter was clear though that she will not be agreeable to pt going just anywhere and depending on available options she may want pt to dc home. Prior to admission was at home with daughter, but pt needs were increasing and pt daughter reported she was beginning to have difficult mananging. CSW expressed understanding and  offered support.    CSW did call [facility] but they are not contracted with pt insurance. CSW to provide pt daughter with alternative bed offers when available. CSW faxed clinicals to [facility].   Assessment/plan status:  Psychosocial Support/Ongoing Assessment of Needs Other assessment/ plan:   Information/referral to community resources:   SNF list to be provided with bed offers.    PATIENT'S/FAMILY'S RESPONSE TO PLAN OF CARE: Pt daughter agreeable to SNF referral but dc plan will be dependent on available options.       [place], [place]

## 2014-04-13 NOTE — Progress Notes (Signed)
  Date: 04/13/2014  Patient name: Wendy Vasquez  Medical record number: [id number]  Date of birth: 1923-12-10   This patient has been seen and the plan of care was discussed with the house staff. Please see their note for complete details. I concur with their findings with the following additions/corrections: Daughter is in room who states pt had rough night with coughing and pain in legs. Pt is only able to c/o pain in feet as she did yesterday. Pt had no coughing while team was in room. Pt is medically stable for D/C but daughter is interested in short term SNF. Social worker is looking into placement. Will narrow ABX prior to D/C - we have no cx data to narrow ABX.   Burns Spain, MD 04/13/2014, 12:48 PM

## 2014-04-13 NOTE — Discharge Instructions (Signed)
Take 1 pill of the augmentin antibiotics two times per day for 12 days (until they run out).   The cough will continue as the antibiotics continue to do their job. Supportive care (drinking plenty of fluids) will also help you feel better.  Pneumonia Pneumonia is an infection of the lungs.  CAUSES Pneumonia may be caused by bacteria or a virus. Usually, these infections are caused by breathing infectious particles into the lungs (respiratory tract). SIGNS AND SYMPTOMS   Cough.  Fever.  Chest pain.  Increased rate of breathing.  Wheezing.  Mucus production. DIAGNOSIS  If you have the common symptoms of pneumonia, your health care provider will typically confirm the diagnosis with a chest X-ray. The X-ray will show an abnormality in the lung (pulmonary infiltrate) if you have pneumonia. Other tests of your blood, urine, or sputum may be done to find the specific cause of your pneumonia. Your health care provider may also do tests (blood gases or pulse oximetry) to see how well your lungs are working. TREATMENT  Some forms of pneumonia may be spread to other people when you cough or sneeze. You may be asked to wear a mask before and during your exam. Pneumonia that is caused by bacteria is treated with antibiotic medicine. Pneumonia that is caused by the influenza virus may be treated with an antiviral medicine. Most other viral infections must run their course. These infections will not respond to antibiotics.  HOME CARE INSTRUCTIONS   Cough suppressants may be used if you are losing too much rest. However, coughing protects you by clearing your lungs. You should avoid using cough suppressants if you can.  Your health care provider may have prescribed medicine if he or she thinks your pneumonia is caused by bacteria or influenza. Finish your medicine even if you start to feel better.  Your health care provider may also prescribe an expectorant. This loosens the mucus to be coughed  up.  Take medicines only as directed by your health care provider.  Do not smoke. Smoking is a common cause of bronchitis and can contribute to pneumonia. If you are a smoker and continue to smoke, your cough may last several weeks after your pneumonia has cleared.  A cold steam vaporizer or humidifier in your room or home may help loosen mucus.  Coughing is often worse at night. Sleeping in a semi-upright position in a recliner or using a couple pillows under your head will help with this.  Get rest as you feel it is needed. Your body will usually let you know when you need to rest. PREVENTION A pneumococcal shot (vaccine) is available to prevent a common bacterial cause of pneumonia. This is usually suggested for:  People over 81 years old.  Patients on chemotherapy.  People with chronic lung problems, such as bronchitis or emphysema.  People with immune system problems. If you are over 65 or have a high risk condition, you may receive the pneumococcal vaccine if you have not received it before. In some countries, a routine influenza vaccine is also recommended. This vaccine can help prevent some cases of pneumonia.You may be offered the influenza vaccine as part of your care. If you smoke, it is time to quit. You may receive instructions on how to stop smoking. Your health care provider can provide medicines and counseling to help you quit. SEEK MEDICAL CARE IF: You have a fever. SEEK IMMEDIATE MEDICAL CARE IF:   Your illness becomes worse. This is especially true if  you are elderly or weakened from any other disease.  You cannot control your cough with suppressants and are losing sleep.  You begin coughing up blood.  You develop pain which is getting worse or is uncontrolled with medicines.  Any of the symptoms which initially brought you in for treatment are getting worse rather than better.  You develop shortness of breath or chest pain. MAKE SURE YOU:   Understand  these instructions.  Will watch your condition.  Will get help right away if you are not doing well or get worse. Document Released: 07/06/2005 Document Revised: 11/20/2013 Document Reviewed: 09/25/2010 [facility] Patient Information 2015 [place], [place]. This information is not intended to replace advice given to you by your health care provider. Make sure you discuss any questions you have with your health care provider.

## 2014-04-13 NOTE — Progress Notes (Addendum)
Clinical Social Work Department CLINICAL SOCIAL WORK PLACEMENT NOTE 04/13/2014  Patient:  Wendy Vasquez, Wendy Vasquez  Account Number:  [id number] Admit date:  04/11/2014  Clinical Social Worker:  Sharol Harness, Theresia Majors  Date/time:  04/13/2014 12:00 N  Clinical Social Work is seeking post-discharge placement for this patient at the following level of care:   SKILLED NURSING   (*CSW will update this form in Epic as items are completed)   04/13/2014  Patient/family provided with [facility] Department of Clinical Social Work's list of facilities offering this level of care within the geographic area requested by the patient (or if unable, by the patient's family).  04/13/2014  Patient/family informed of their freedom to choose among providers that offer the needed level of care, that participate in Medicare, Medicaid or managed care program needed by the patient, have an available bed and are willing to accept the patient.  04/13/2014  Patient/family informed of [facility]' ownership interest in [facility], as well as of the fact that they are under no obligation to receive care at this facility.  PASARR submitted to EDS on 04/13/2014 PASARR number received on 04/13/2014  FL2 transmitted to all facilities in geographic area requested by pt/family on  04/13/2014 FL2 transmitted to all facilities within larger geographic area on   Patient informed that his/her managed care company has contracts with or will negotiate with  certain facilities, including the following:     Patient/family informed of bed offers received:  04/13/2014 Patient chooses bed at --- DECLINED Physician recommends and patient chooses bed at    Patient to be transferred to  HOME on  04/13/2014 Patient to be transferred to facility by  Patient and family notified of transfer on  Name of family member notified:  Daughter Aurther Loft  The following physician request were entered in Epic: Physician Request  Please sign FL2.     Additional CommentsSharol Harness, LCSWA [phone]

## 2014-04-13 NOTE — Care Management Note (Signed)
    Page 1 of 1   04/13/2014     3:40:14 PM CARE MANAGEMENT NOTE 04/13/2014  Patient:  Wendy Vasquez, Wendy Vasquez   Account Number:  [id number]  Date Initiated:  04/13/2014  Documentation initiated by:  GRAVES-BIGELOW,Kaius Daino  Subjective/Objective Assessment:   Pt admitted for cp. Plan for home today.     Action/Plan:   CM did offer choice for [facility] services. Family chose [facility] for services. Referral made for services. SOC to begin within 24-48 hrs post d/c.   Anticipated DC Date:  04/13/2014   Anticipated DC Plan:  HOME W HOME HEALTH SERVICES      DC Planning Services  CM consult      [facility] Choice  HOME HEALTH   Choice offered to / List presented to:  C-4 Adult Children        HH arranged  HH-1 RN  HH-10 DISEASE MANAGEMENT  HH-2 PT  HH-4 NURSE'S AIDE  HH-6 SOCIAL WORKER      HH agency  [facility]   Status of service:  Completed, signed off Medicare Important Message given?   (If response is "NO", the following Medicare IM given date fields will be blank) Date Medicare IM given:   Medicare IM given by:   Date Additional Medicare IM given:   Additional Medicare IM given by:    Discharge Disposition:    Per UR Regulation:    If discussed at Long Length of Stay Meetings, dates discussed:    Comments:

## 2014-04-13 NOTE — Evaluation (Signed)
Physical Therapy Evaluation/ Discharge Patient Details Name: MARIELLA BLACKWELDER MRN: [id number] DOB: 02/18/1924 Today's Date: 04/13/2014   History of Present Illness  Ms Eddie is an 80 year old woman, with past medical history of hypertension, asthma, hyperlipidemia, and generalized deconditioning, who presents to the emergency department, with history of cough, and chest pain for about a week, PNA  Clinical Impression  Pt pleasant with decreased vocal quality and difficult to understand. Pt total assist for all mobility, ADLs and feeding. Per dgtr who provided PLOF pt has been in a lift chair, total assist and not walking for 4 years. Pt at baseline deconditioned and total assist and recommend SNF for D/C. Pt not currently appropriate for acute therapy due to pt at baseline total assist level. Recommend OOB via maximove acutely and RN perform ROM all extremities. Dgtr has been providing care for pt but interested in assist. If pt returns home recommend hoyer and hospital bed for pt and caregiver safety as well as to decrease skin breakdown.     Follow Up Recommendations SNF    Equipment Recommendations  Hospital bed;Other (comment) (hoyer lift)    Recommendations for Other Services       Precautions / Restrictions Precautions Precautions: Fall Precaution Comments: total assist      Mobility  Bed Mobility Overal bed mobility: Needs Assistance Bed Mobility: Supine to Sit;Sit to Supine;Rolling Rolling: Total assist   Supine to sit: Total assist Sit to supine: Total assist   General bed mobility comments: total assist to roll bil for pericare as pt incontinent, total assist to pivot to EOB and sit as well as return to supine  Transfers                 General transfer comment: did not attempt OOB tranfers for caregiver safety  Ambulation/Gait                Stairs            Wheelchair Mobility    Modified Rankin (Stroke Patients Only)       Balance  Overall balance assessment: Needs assistance   Sitting balance-Leahy Scale: Zero                                       Pertinent Vitals/Pain Pain Assessment: No/denies pain    Home Living Family/patient expects to be discharged to:: Private residence Living Arrangements: Children Available Help at Discharge: Family;Available 24 hours/day Type of Home: House Home Access: Stairs to enter   [employer] of Steps: 3-4 Home Layout: One level Home Equipment: Wheelchair - manual;Transport chair;Bedside commode;Other (comment) (lift chair)      Prior Function Level of Independence: Needs assistance   Gait / Transfers Assistance Needed: pt is total assist to stand and pivot to [facility], Pt lays in lift chair all the time and is total assist for all care. Dgtr states she tries to do ROM and doesn 't have room in house for hospital bed or lift. Pt has not ambulated for 4 years  ADL's / Homemaking Assistance Needed: total assist        Hand Dominance        Extremity/Trunk Assessment   Upper Extremity Assessment: Generalized weakness;RUE deficits/detail;LUE deficits/detail RUE Deficits / Details: pt with very stiff RUE able to perform shoulder ROM grossly 30degrees abduction and flexion with pt grimace, pt maintains hands clenched     LUE  Deficits / Details: pt with very stiff RUE able to perform shoulder ROM grossly 30degrees abduction and flexion with pt grimace, pt maintains hands clenched   Lower Extremity Assessment: Generalized weakness;RLE deficits/detail;LLE deficits/detail RLE Deficits / Details: pt with bil knee flexion contracture grossly 10 degrees, foot drop and only 1/5 movement of bil LE with PROM for positioning  LLE Deficits / Details: pt with bil knee flexion contracture grossly 10 degrees, foot drop and only 1/5 movement of bil LE with PROM for positioning   Cervical / Trunk Assessment: Kyphotic;Other exceptions  Communication    Communication: Expressive difficulties  Cognition Arousal/Alertness: Awake/alert Behavior During Therapy: Flat affect Overall Cognitive Status: No family/caregiver present to determine baseline cognitive functioning                      General Comments      Exercises        Assessment/Plan    PT Assessment All further PT needs can be met in the next venue of care  PT Diagnosis Generalized weakness;Altered mental status   PT Problem List Decreased strength;Decreased range of motion;Decreased cognition;Decreased activity tolerance;Decreased balance;Decreased skin integrity;Decreased mobility  PT Treatment Interventions     PT Goals (Current goals can be found in the Care Plan section) Acute Rehab PT Goals PT Goal Formulation: No goals set, d/c therapy    Frequency     Barriers to discharge        Co-evaluation               End of Session   Activity Tolerance: Patient limited by fatigue Patient left: in bed;with call bell/phone within reach;with nursing/sitter in room Nurse Communication: Mobility status;Precautions         Time: 7290-2111 PT Time Calculation (min): 19 min   Charges:   PT Evaluation $Initial PT Evaluation Tier I: 1 Procedure PT Treatments $Therapeutic Activity: 8-22 mins   PT G Codes:          Melford Aase 04/13/2014, 9:15 AM Elwyn Reach, [place]

## 2014-04-13 NOTE — Progress Notes (Addendum)
Subjective: Again complained of pain in her feet. Difficult to obtain history from patient.  History provided by Ms. Metallo's daughter: believes she is better than she was on admission, but still coughing. Believes she did not sleep well and has her chronic leg pain (thinks it is neuropathic). Has decided she is interested in short-term rehab for her mother.  Objective: Vital signs in last 24 hours: Filed Vitals:   04/12/14 2008 04/12/14 2032 04/13/14 0527 04/13/14 1318  BP:  106/50 129/56 134/65  Pulse: 80 82 80 78  Temp:  97.9 F (36.6 C) 98.2 F (36.8 C) 98.9 F (37.2 C)  TempSrc:  Oral Oral   Resp: Height:      Weight:   127 lb 14.4 oz (58.015 kg)   SpO2:  98% 96% 97%   Weight change: 2 lb (0.907 kg)  Intake/Output Summary (Last 24 hours) at 04/13/14 1427 Last data filed at 04/13/14 0900  Gross per 24 hour  Intake   1580 ml  Output      0 ml  Net   1580 ml   Physical Exam: Appearance: frail woman, appears uncomfortable. Did not cough during entire visit/exam. HEENT: AT/, left ptosis, dry mucous membranes, EOMi, PERRL Heart: RRR, normal S1S2 Lungs: crackles R>L, decreased since yesterday Abdomen: BS+, soft, nontender Extremities: no edema in lower extremities, DPs strong b/l, no lesions, ulcers, bruising or other signs of injury or pain Neurologic: A&Ox 1 or 2 (states she is in "emergency" and knows her name. States that the date is "47.Marland Kitchen..") Skin: no lesions or rashes  Lab Results: Basic Metabolic Panel:  Recent Labs Lab 04/11/14 1010 04/12/14 0600  NA 139 138  K 3.4* 4.9  CL 101 106  CO2 20 20  GLUCOSE 162* 116*  BUN 12 15  CREATININE 0.72 0.80  CALCIUM 10.2 10.0   CBC:  Recent Labs Lab 04/11/14 1010 04/12/14 0600  WBC 12.5* 10.5  NEUTROABS 11.3* 7.7  HGB 11.5* 10.1*  HCT 35.1* 30.4*  MCV 93.9 93.3  PLT 334 310   Cardiac Enzymes:  Recent Labs Lab 04/11/14 1420 04/11/14 1836 04/12/14 0136  TROPONINI <0.30 <0.30 <0.30    BNP:  Recent Labs Lab 04/11/14 1010  PROBNP 191.5   Micro Results: Recent Results (from the past 240 hour(s))  CULTURE, BLOOD (ROUTINE X 2)     Status: None   Collection Time    04/11/14  2:10 PM      Result Value Ref Range Status   Specimen Description BLOOD LEFT HAND   Final   Special Requests BOTTLES DRAWN AEROBIC AND ANAEROBIC 5CC   Final   Culture  Setup Time     Final   Value: 04/11/2014 22:36     Performed at [facility]   Culture     Final   Value:        BLOOD CULTURE RECEIVED NO GROWTH TO DATE CULTURE WILL BE HELD FOR 5 DAYS BEFORE ISSUING A FINAL NEGATIVE REPORT     Performed at [facility]   Report Status PENDING   Incomplete  CULTURE, BLOOD (ROUTINE X 2)     Status: None   Collection Time    04/11/14  2:20 PM      Result Value Ref Range Status   Specimen Description BLOOD RIGHT HAND   Final   Special Requests BOTTLES DRAWN AEROBIC ONLY 8CC   Final   Culture  Setup Time  Final   Value: 04/11/2014 22:35     Performed at [facility]   Culture     Final   Value:        BLOOD CULTURE RECEIVED NO GROWTH TO DATE CULTURE WILL BE HELD FOR 5 DAYS BEFORE ISSUING A FINAL NEGATIVE REPORT     Performed at [facility]   Report Status PENDING   Incomplete   Studies/Results: Dg Chest 2 View  04/12/2014   ADDENDUM REPORT: 04/12/2014 14:05  ADDENDUM: The impression should read stable mild left base subsegmental atelectasis and/or infiltrate. Please ignore the word 'could' .   Electronically Signed   By: Maisie Fus  Register   On: 04/12/2014 14:05   04/12/2014   CLINICAL DATA:  Pneumonia.  EXAM: CHEST  2 VIEW  COMPARISON:  04/11/2014.  FINDINGS: Mediastinum and hilar structures are normal. Heart size normal. Persistent mild left basilar atelectasis and or infiltrate. Lungs otherwise clear. No pneumothorax. 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 Degenerative changes both shoulders and thoracic spine.   IMPRESSION: Stable mild left base subsegmental atelectasis and or infiltrate could  Electronically Signed: ByMaisie Fus  Register On: 04/12/2014 07:47   Medications: I have reviewed the patient's current medications. Scheduled Meds: . aspirin EC  81 mg Oral Q breakfast  . ceFEPime (MAXIPIME) IV  1 g Intravenous Q24H  . docusate sodium  200 mg Oral QHS  . gemfibrozil  600 mg Oral BID  . ipratropium-albuterol  3 mL Nebulization TID  . metoCLOPramide  10 mg Oral Q breakfast  . pantoprazole  80 mg Oral Q1200  . vancomycin  750 mg Intravenous Q24H   Continuous Infusions:  PRN Meds:.guaiFENesin-dextromethorphan, menthol-cetylpyridinium Assessment/Plan: Principal Problem:   HCAP (healthcare-associated pneumonia) Active Problems:   HTN (hypertension)   Asthma   CAP (community acquired pneumonia)   Chest pain 80 year old woman, with past medical history of hypertension, and hyperlipidemia, presents with mild fevers with persistent cough, and chest pain. Failed outpatient antibiotic therapy with doxycycline. Better. Not coughing on exam.  CAP: Patient's presentation consistent with pneumonia. Chest x-ray revealed left lower lobe atelectasis or possible infiltrate (confirmed with radiology). Recently received outpatient augmentin for UTI and 4 days of a course of doxycycline for present symptoms. Was noted to have a mild fever of 100.6, now afebrile; had leukocytosis to 12.5, now 10.5 with a left shift. Had elevated lactic acid that resolved overnight with hydration. Satting 98% on room air and has less of a cough than yesterday. - Continue DuoNebs every 6 hours scheduled - Vancomycin, and cefepime for treatment of HCAP; will transition to augmentin with nebs as outpatient - Blood cultures NGTD - Urine Strep, Legionella antigen, sputum culture have not been collected; will cancel at this point (patient has been on abx all month) - Tessalon for cough and cepacol for sore throat (requested by  daughter) - Lactic acid level resolved   Urine Culture + for e.coli: with negative UTI - Likely colonized; she has not been having symptoms; will not treat at this time  Resolved anion gap metabolic acidosis: Anion gap noted to be 18 up from 14 5 days ago, now resolved. Likely occurred in setting of acute illness. Resolved.  Chest pain: Patient reported chest pain, but no longer has chest pain. As per daughter, the pain occurred with coughing. Her negative troponins x3 and negative EKG also make ACS less likely.  Diet:  - Regular diet (she eats regular food at home) - Supplement potassium  DVT prophylaxis:  - Lovenox subQ  Dispo: Disposition is deferred at this time, awaiting improvement of current medical problems.  Anticipated discharge in approximately 0 day(s). PT recommending SNF.  The patient does have a current PCP Barbie Banner, MD) and does not need an [facility] hospital follow-up appointment after discharge.  The patient does have transportation limitations that hinder transportation to clinic appointments.  .Services Needed at time of discharge: Y = Yes, Blank = No PT:   OT:   RN:   Equipment:   Other:     LOS: 2 days   Dionne Ano, MD 04/13/2014, 2:27 PM

## 2014-04-13 NOTE — Progress Notes (Signed)
OT Cancellation Note  Patient Details Name: Wendy Vasquez MRN: [id number] DOB: [birth year]   Cancelled Treatment:    Reason Eval/Treat Not Completed: OT screened, no needs identified, will sign off  Note pt total assist with PT and that pt has been total assist for all selfcare, feeding, and transfers over the past 4 years.  Feel pt is at her functional baseline based on PT findings.  No need for OT evaluation at this time.   Aleira Deiter OTR/L 04/13/2014, 1:46 PM

## 2014-04-13 NOTE — Progress Notes (Signed)
CSW (Clinical Child psychotherapist) notified that pt has been denied English as a second language teacher. CSW notified pt family. They have chosen to have pt dc home. RNCM and MD notified. CSW signing off.  Rivka Baune, LCSWA [phone]

## 2014-04-15 NOTE — Discharge Summary (Signed)
Name: Wendy Vasquez MRN: [id number] DOB: [birth year] 80 y.o. PCP: Wendy Banner, MD  Date of Admission: 04/11/2014  8:54 AM Date of Discharge: 04/15/2014 Attending Physician: No att. providers found  Discharge Diagnosis: Active Problems:   HTN (hypertension)   Asthma   CAP (community acquired pneumonia)  Discharge Medications:   Medication List    STOP taking these medications       doxycycline 100 MG tablet  Commonly known as:  VIBRA-TABS      TAKE these medications       acetaminophen 500 MG tablet  Commonly known as:  TYLENOL  Take 1,000 mg by mouth 2 (two) times daily as needed for mild pain.     ADVAIR DISKUS 250-50 MCG/DOSE Aepb  Generic drug:  Fluticasone-Salmeterol  Inhale 1 puff into the lungs 2 (two) times daily.     ALPRAZolam 0.25 MG tablet  Commonly known as:  XANAX  Take 0.5 tablets by mouth at bedtime.     amoxicillin-clavulanate 500-125 MG per tablet  Commonly known as:  AUGMENTIN  Take 1 tablet (500 mg total) by mouth 2 (two) times daily.     aspirin EC 81 MG tablet  Take 81 mg by mouth daily with breakfast.     calcium-vitamin D 500-200 MG-UNIT per tablet  Commonly known as:  OSCAL WITH D  Take 1 tablet by mouth 2 (two) times daily.     cetirizine 10 MG tablet  Commonly known as:  ZYRTEC  Take 10 mg by mouth daily as needed for allergies.     chlorpheniramine-HYDROcodone 10-8 MG/5ML Lqcr  Commonly known as:  TUSSIONEX  Take 5 mLs by mouth at bedtime as needed for cough.     Cranberry 1000 MG Caps  Take 2,000 mg by mouth daily with breakfast.     dextromethorphan 30 MG/5ML liquid  Commonly known as:  DELSYM  Take 60 mg by mouth every 12 (twelve) hours as needed for cough.     docusate sodium 100 MG capsule  Commonly known as:  COLACE  Take 200 mg by mouth at bedtime.     esomeprazole 40 MG capsule  Commonly known as:  NEXIUM  Take 40 mg by mouth daily.     gemfibrozil 600 MG tablet  Commonly known as:  LOPID  Take 600 mg by  mouth 2 (two) times daily.     guaiFENesin 600 MG 12 hr tablet  Commonly known as:  MUCINEX  Take 600 mg by mouth 2 (two) times daily as needed for cough or to loosen phlegm.     HYDROcodone-homatropine 5-1.5 MG/5ML syrup  Commonly known as:  HYCODAN  Take 5 mLs by mouth every 6 (six) hours as needed for cough.     metoCLOPramide 10 MG tablet  Commonly known as:  REGLAN  Take 10 mg by mouth daily with breakfast.     multivitamin with minerals Tabs tablet  Take 1 tablet by mouth daily.     polyethylene glycol packet  Commonly known as:  MIRALAX / GLYCOLAX  Take 17 g by mouth daily as needed for mild constipation.     PROBIOTIC PO  Take 1 capsule by mouth daily.     VISINE 0.05 % ophthalmic solution  Generic drug:  tetrahydrozoline  Place 1 drop into both eyes daily as needed (for dry eyes).     vitamin B-12 1000 MCG tablet  Commonly known as:  CYANOCOBALAMIN  Take 1,000 mcg by mouth daily with breakfast.  Disposition and follow-up:   [name] was discharged from [facility] in Stable condition.  At the hospital follow up visit please address:  1.  [name] presented with possible pneumonia that failed outpatient doxycycline. She was initially treated for HCAP with vancomycin and cefepime, then on clarification of the history, treated for CAP with augmentin and duonebs. She was set up with home health RN, PT aide and SW as an outpatient.  2.  Labs / imaging needed at time of follow-up: none  3.  Pending labs/ test needing follow-up: none  Follow-up Appointments:     Follow-up Information   Follow up with [facility]. Social research officer, government, Physical Therapy, Aide and Social Worker)    Contact information:   [address] [place] [address] [phone]       Follow up with Wendy Gemma, PA-C On 04/24/2014. (Arrive at 11:00 AM  (appointment at 11:20))    Specialty:  Family Medicine   Contact information:    [address] [place] [address] [phone]       Discharge Instructions: Discharge Instructions   DME Nebulizer machine    Complete by:  As directed      DME Nebulizer/meds    Complete by:  As directed      Diet - low sodium heart healthy    Complete by:  As directed      Increase activity slowly    Complete by:  As directed            Consultations:  none  Procedures Performed:  Dg Chest 1 View  04/11/2014   CLINICAL DATA:  Cough, weakness, confusion  EXAM: CHEST - 1 VIEW  COMPARISON:  04/06/2014  FINDINGS: Cardiomediastinal silhouette is stable. No pulmonary edema. Left basilar atelectasis or infiltrate. Stable sclerotic changes proximal right humerus.  IMPRESSION: Left basilar atelectasis or infiltrate.  No pulmonary edema.   Electronically Signed   By: Wendy Vasquez M.D.   On: 04/11/2014 09:42   Dg Chest 2 View  04/12/2014   ADDENDUM REPORT: 04/12/2014 14:05  ADDENDUM: The impression should read stable mild left base subsegmental atelectasis and/or infiltrate. Please ignore the word 'could' .   Electronically Signed   By: Wendy Fus  Register   On: 04/12/2014 14:05   04/12/2014   CLINICAL DATA:  Pneumonia.  EXAM: CHEST  2 VIEW  COMPARISON:  04/11/2014.  FINDINGS: Mediastinum and hilar structures are normal. Heart size normal. Persistent mild left basilar atelectasis and or infiltrate. Lungs otherwise clear. No pneumothorax. 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 Degenerative changes both shoulders and thoracic spine.  IMPRESSION: Stable mild left base subsegmental atelectasis and or infiltrate could  Electronically Signed: ByMaisie Fus  Register On: 04/12/2014 07:47   Dg Chest 2 View  04/06/2014   CLINICAL DATA:  Weakness  EXAM: CHEST  2 VIEW  COMPARISON:  November 09, 2013  FINDINGS: There is no edema or consolidation. Heart size and pulmonary vascularity are normal. No adenopathy. There is degenerative change in the thoracic spine. There is a  presumed bone infarct in the proximal right humerus, stable. There is evidence of superior migration of the right humeral head, suggesting chronic rotator cuff tear.  IMPRESSION: No edema or consolidation. Apparent bone infarct right proximal humerus. Evidence of chronic rotator cuff tear in the right shoulder. Bones are osteoporotic.   Electronically Signed   By: Bretta Bang M.D.   On: 04/06/2014 09:26   Admission  HPI:  (The history is provided by her daughter as the patient is minimally interactive. Responds to questions with yes/no and nonverbal).   She a 80 year old woman, with past medical history of hypertension, asthma, hyperlipidemia, and generalized deconditioning, who presents to the emergency department, with history of cough, and chest pain for about a week. Cough is productive of yellow, and sometimes green sputum, without streaks of blood. She also reports progressive SOB. Patient also reportedly has been experiencing wheezing which family has attribute to asthma. Patient was seen in [facility] department on 04/06/2014 where she was treated with IV Azithromycin and Rocephin x1. She was discharged from the ED, with a diagnosis of ?acute viral bronchitis and no antibiotics were prescribed. On arrival to home daughter contacted PCP and a prescription of doxycycline was called in. Patient has been taking doxycycline consistently for about 5 days, but symptoms have worsened with increasing cough. Prior to the doxycycline, she had been prescribed Augmentin ? for a UTI as per the patient's daughter. On the morning of admission, patient complained of chest pain, and EMS was called. She was treated with 5 pills of 81 mg of Aspirin per recommendation by EMS and presented to the ED. She also received nebulizing treatment by EMS with some good response. Otherwise, according to the daughter, the patient's appetite has been good, even though she has been generally fatigued and subjective  fevers. However, daughter reports that she feels hot. No loss of consciousness.   Patient is generally deconditioned and requires a lot of assistance from her daughter for most activities of daily living. She lives at home.   Hospital Course by problem list: Active Problems:   HTN (hypertension)   Asthma   CAP (community acquired pneumonia)   80 year old woman, with past medical history of hypertension, and hyperlipidemia, presented with mild fevers with persistent cough, and chest pain. She had failed outpatient antibiotic therapy with doxycycline (4 days). Cardiac workup revealed negative troponins x3 and a negative EKG. On further clarification, her chest pain was only present when she coughed and resolved in the hospital. Her cough improved on antibiotics and nebulizer treatments.  CAP: Patient's presentation consistent with pneumonia. Chest x-ray revealed left lower lobe atelectasis or possible infiltrate. Recently received outpatient augmentin for UTI and 4 days of a course of doxycycline for present symptoms. Was noted to have a mild fever of 100.6 as an outpatient, afebrile throughout the hospitalization; had leukocytosis to 12.5, now 10.5 with a left shift. Blood cultures were negative. Had elevated lactic acid that resolved during her first night of hospitalization with hydration. Sats 98% on room air. Was treated with cefepime and vancomycin in the hospital for coverage of HCAP; later clarified that symptoms have not changed since before her ED visit one week prior to this hospitalization. Treatment was changed to cover for CAP. History of asthma may have contributed to her symptoms.  Urine Culture + for e.coli: with negative UA and no symptoms.  Resolved anion gap metabolic acidosis: Anion gap noted to be 18 on admission, resolved. Likely occurred in setting of acute illness.  Hypertension: normotensive on this admission.  Chest pain: Patient initially reported chest pain, but it  never recurred in the hospital. As per daughter, the pain occurred with coughing. Her negative troponins x3 and negative EKG also make ACS less likely.  Discharge Vitals:   BP 134/65  Pulse 78  Temp(Src) 98.9 F (37.2 C) (Oral)  Resp 16  Ht  (1.702 m)  Wt 127 lb 14.4 oz (58.015 kg)  BMI 20.03 kg/m2  SpO2 97%  Signed: Dionne Ano, MD 04/15/2014, 10:57 AM    Services Ordered on Discharge: initially, family was interested in SNF; then insurance denied and family opted to take her home with [facility], HHPT, HHA, HHSW Equipment Ordered on Discharge: duonebs

## 2014-04-17 LAB — CULTURE, BLOOD (ROUTINE X 2)
CULTURE: NO GROWTH
Culture: NO GROWTH

## 2014-04-23 ENCOUNTER — Inpatient Hospital Stay (HOSPITAL_COMMUNITY)
Admission: EM | Admit: 2014-04-23 | Discharge: 2014-05-01 | DRG: 871 | Disposition: A | Discharge status: Skilled Nursing Facility | Payer: Medicare Other | Attending: Oncology | Admitting: Oncology

## 2014-04-23 ENCOUNTER — Emergency Department (HOSPITAL_COMMUNITY): Payer: Medicare Other

## 2014-04-23 ENCOUNTER — Encounter (HOSPITAL_COMMUNITY): Payer: Self-pay | Admitting: Emergency Medicine

## 2014-04-23 DIAGNOSIS — E119 Type 2 diabetes mellitus without complications: Secondary | ICD-10-CM | POA: Diagnosis present

## 2014-04-23 DIAGNOSIS — F0390 Unspecified dementia without behavioral disturbance: Secondary | ICD-10-CM | POA: Diagnosis present

## 2014-04-23 DIAGNOSIS — N179 Acute kidney failure, unspecified: Secondary | ICD-10-CM

## 2014-04-23 DIAGNOSIS — J4520 Mild intermittent asthma, uncomplicated: Secondary | ICD-10-CM

## 2014-04-23 DIAGNOSIS — Z993 Dependence on wheelchair: Secondary | ICD-10-CM

## 2014-04-23 DIAGNOSIS — D72810 Lymphocytopenia: Secondary | ICD-10-CM | POA: Diagnosis present

## 2014-04-23 DIAGNOSIS — R05 Cough: Secondary | ICD-10-CM | POA: Diagnosis not present

## 2014-04-23 DIAGNOSIS — F419 Anxiety disorder, unspecified: Secondary | ICD-10-CM | POA: Diagnosis present

## 2014-04-23 DIAGNOSIS — Y95 Nosocomial condition: Secondary | ICD-10-CM | POA: Diagnosis present

## 2014-04-23 DIAGNOSIS — Z96653 Presence of artificial knee joint, bilateral: Secondary | ICD-10-CM | POA: Diagnosis present

## 2014-04-23 DIAGNOSIS — E785 Hyperlipidemia, unspecified: Secondary | ICD-10-CM

## 2014-04-23 DIAGNOSIS — Z87891 Personal history of nicotine dependence: Secondary | ICD-10-CM

## 2014-04-23 DIAGNOSIS — R079 Chest pain, unspecified: Secondary | ICD-10-CM

## 2014-04-23 DIAGNOSIS — I4891 Unspecified atrial fibrillation: Secondary | ICD-10-CM

## 2014-04-23 DIAGNOSIS — Z6820 Body mass index (BMI) 20.0-20.9, adult: Secondary | ICD-10-CM

## 2014-04-23 DIAGNOSIS — J45909 Unspecified asthma, uncomplicated: Secondary | ICD-10-CM | POA: Diagnosis present

## 2014-04-23 DIAGNOSIS — I480 Paroxysmal atrial fibrillation: Secondary | ICD-10-CM

## 2014-04-23 DIAGNOSIS — J9601 Acute respiratory failure with hypoxia: Secondary | ICD-10-CM | POA: Diagnosis not present

## 2014-04-23 DIAGNOSIS — R233 Spontaneous ecchymoses: Secondary | ICD-10-CM | POA: Diagnosis present

## 2014-04-23 DIAGNOSIS — R000 Tachycardia, unspecified: Secondary | ICD-10-CM | POA: Diagnosis present

## 2014-04-23 DIAGNOSIS — R269 Unspecified abnormalities of gait and mobility: Secondary | ICD-10-CM

## 2014-04-23 DIAGNOSIS — I679 Cerebrovascular disease, unspecified: Secondary | ICD-10-CM | POA: Diagnosis present

## 2014-04-23 DIAGNOSIS — Z7982 Long term (current) use of aspirin: Secondary | ICD-10-CM

## 2014-04-23 DIAGNOSIS — G9341 Metabolic encephalopathy: Secondary | ICD-10-CM | POA: Diagnosis present

## 2014-04-23 DIAGNOSIS — I10 Essential (primary) hypertension: Secondary | ICD-10-CM

## 2014-04-23 DIAGNOSIS — A419 Sepsis, unspecified organism: Principal | ICD-10-CM

## 2014-04-23 DIAGNOSIS — J90 Pleural effusion, not elsewhere classified: Secondary | ICD-10-CM | POA: Diagnosis present

## 2014-04-23 DIAGNOSIS — I313 Pericardial effusion (noninflammatory): Secondary | ICD-10-CM | POA: Diagnosis present

## 2014-04-23 DIAGNOSIS — J45901 Unspecified asthma with (acute) exacerbation: Secondary | ICD-10-CM | POA: Diagnosis present

## 2014-04-23 DIAGNOSIS — R6510 Systemic inflammatory response syndrome (SIRS) of non-infectious origin without acute organ dysfunction: Secondary | ICD-10-CM | POA: Insufficient documentation

## 2014-04-23 DIAGNOSIS — G20 Parkinson's disease: Secondary | ICD-10-CM | POA: Diagnosis present

## 2014-04-23 DIAGNOSIS — Z79899 Other long term (current) drug therapy: Secondary | ICD-10-CM

## 2014-04-23 DIAGNOSIS — I3139 Other pericardial effusion (noninflammatory): Secondary | ICD-10-CM

## 2014-04-23 DIAGNOSIS — Z8711 Personal history of peptic ulcer disease: Secondary | ICD-10-CM

## 2014-04-23 DIAGNOSIS — K219 Gastro-esophageal reflux disease without esophagitis: Secondary | ICD-10-CM | POA: Diagnosis present

## 2014-04-23 DIAGNOSIS — R059 Cough, unspecified: Secondary | ICD-10-CM

## 2014-04-23 DIAGNOSIS — J189 Pneumonia, unspecified organism: Secondary | ICD-10-CM

## 2014-04-23 DIAGNOSIS — J129 Viral pneumonia, unspecified: Secondary | ICD-10-CM

## 2014-04-23 HISTORY — DX: Pneumonia, unspecified organism: J18.9

## 2014-04-23 HISTORY — DX: Gastro-esophageal reflux disease without esophagitis: K21.9

## 2014-04-23 HISTORY — DX: Headache: R51

## 2014-04-23 HISTORY — DX: Migraine, unspecified, not intractable, without status migrainosus: G43.909

## 2014-04-23 HISTORY — DX: Personal history of other diseases of the digestive system: Z87.19

## 2014-04-23 HISTORY — DX: Personal history of other medical treatment: Z92.89

## 2014-04-23 HISTORY — DX: Headache, unspecified: R51.9

## 2014-04-23 HISTORY — DX: Unspecified osteoarthritis, unspecified site: M19.90

## 2014-04-23 LAB — COMPREHENSIVE METABOLIC PANEL
ALBUMIN: 3.1 g/dL — AB (ref 3.5–5.2)
ALT: 6 U/L (ref 0–35)
ANION GAP: 13 (ref 5–15)
AST: 20 U/L (ref 0–37)
Alkaline Phosphatase: 113 U/L (ref 39–117)
BILIRUBIN TOTAL: 0.4 mg/dL (ref 0.3–1.2)
BUN: 13 mg/dL (ref 6–23)
CHLORIDE: 99 meq/L (ref 96–112)
CO2: 23 mEq/L (ref 19–32)
Calcium: 10.2 mg/dL (ref 8.4–10.5)
Creatinine, Ser: 0.81 mg/dL (ref 0.50–1.10)
GFR, EST AFRICAN AMERICAN: 73 mL/min — AB (ref >90)
GFR, EST NON AFRICAN AMERICAN: 63 mL/min — AB (ref >90)
GLUCOSE: 176 mg/dL — AB (ref 70–99)
Potassium: 4.1 mEq/L (ref 3.7–5.3)
Sodium: 135 mEq/L — ABNORMAL LOW (ref 137–147)
Total Protein: 7.5 g/dL (ref 6.0–8.3)

## 2014-04-23 LAB — CBC WITH DIFFERENTIAL/PLATELET
Basophils Absolute: 0 10*3/uL (ref 0.0–0.1)
Basophils Relative: 0 % (ref 0–1)
Eosinophils Absolute: 0 10*3/uL (ref 0.0–0.7)
Eosinophils Relative: 0 % (ref 0–5)
HEMATOCRIT: 36.5 % (ref 36.0–46.0)
HEMOGLOBIN: 12 g/dL (ref 12.0–15.0)
LYMPHS ABS: 0.8 10*3/uL (ref 0.7–4.0)
Lymphocytes Relative: 6 % — ABNORMAL LOW (ref 12–46)
MCH: 30.9 pg (ref 26.0–34.0)
MCHC: 32.9 g/dL (ref 30.0–36.0)
MCV: 94.1 fL (ref 78.0–100.0)
MONO ABS: 1.1 10*3/uL — AB (ref 0.1–1.0)
MONOS PCT: 8 % (ref 3–12)
NEUTROS ABS: 11.6 10*3/uL — AB (ref 1.7–7.7)
NEUTROS PCT: 86 % — AB (ref 43–77)
Platelets: 383 10*3/uL (ref 150–400)
RBC: 3.88 MIL/uL (ref 3.87–5.11)
RDW: 13 % (ref 11.5–15.5)
WBC: 13.5 10*3/uL — ABNORMAL HIGH (ref 4.0–10.5)

## 2014-04-23 LAB — URINALYSIS, ROUTINE W REFLEX MICROSCOPIC
BILIRUBIN URINE: NEGATIVE
Glucose, UA: NEGATIVE mg/dL
KETONES UR: NEGATIVE mg/dL
Leukocytes, UA: NEGATIVE
Nitrite: NEGATIVE
Protein, ur: NEGATIVE mg/dL
Specific Gravity, Urine: 1.022 (ref 1.005–1.030)
UROBILINOGEN UA: 0.2 mg/dL (ref 0.0–1.0)
pH: 5.5 (ref 5.0–8.0)

## 2014-04-23 LAB — I-STAT CHEM 8, ED
BUN: 12 mg/dL (ref 6–23)
CALCIUM ION: 1.28 mmol/L (ref 1.13–1.30)
Chloride: 104 mEq/L (ref 96–112)
Creatinine, Ser: 0.9 mg/dL (ref 0.50–1.10)
Glucose, Bld: 179 mg/dL — ABNORMAL HIGH (ref 70–99)
HEMATOCRIT: 41 % (ref 36.0–46.0)
HEMOGLOBIN: 13.9 g/dL (ref 12.0–15.0)
Potassium: 4 mEq/L (ref 3.7–5.3)
Sodium: 137 mEq/L (ref 137–147)
TCO2: 22 mmol/L (ref 0–100)

## 2014-04-23 LAB — I-STAT TROPONIN, ED: Troponin i, poc: 0.01 ng/mL (ref 0.00–0.08)

## 2014-04-23 LAB — URINE MICROSCOPIC-ADD ON

## 2014-04-23 LAB — PROTIME-INR
INR: 1.22 (ref 0.00–1.49)
PROTHROMBIN TIME: 15.4 s — AB (ref 11.6–15.2)

## 2014-04-23 LAB — PRO B NATRIURETIC PEPTIDE: Pro B Natriuretic peptide (BNP): 403.4 pg/mL (ref 0–450)

## 2014-04-23 LAB — I-STAT CG4 LACTIC ACID, ED: Lactic Acid, Venous: 2.14 mmol/L (ref 0.5–2.2)

## 2014-04-23 LAB — APTT: APTT: 41 s — AB (ref 24–37)

## 2014-04-23 MED ORDER — METOCLOPRAMIDE HCL 10 MG PO TABS
10.0000 mg | ORAL_TABLET | Freq: Every day | ORAL | Status: DC
  Administered 2014-04-24 – 2014-04-27 (×4): 10 mg via ORAL
  Filled 2014-04-23 (×5): qty 1

## 2014-04-23 MED ORDER — SODIUM CHLORIDE 0.9 % IV SOLN
1000.0000 mL | INTRAVENOUS | Status: DC
  Administered 2014-04-23: 1000 mL via INTRAVENOUS

## 2014-04-23 MED ORDER — GEMFIBROZIL 600 MG PO TABS
600.0000 mg | ORAL_TABLET | Freq: Two times a day (BID) | ORAL | Status: DC
  Administered 2014-04-24 – 2014-04-25 (×3): 600 mg via ORAL
  Filled 2014-04-23 (×4): qty 1

## 2014-04-23 MED ORDER — MOMETASONE FURO-FORMOTEROL FUM 100-5 MCG/ACT IN AERO
2.0000 | INHALATION_SPRAY | Freq: Two times a day (BID) | RESPIRATORY_TRACT | Status: DC
  Administered 2014-04-24: 2 via RESPIRATORY_TRACT
  Filled 2014-04-23: qty 8.8

## 2014-04-23 MED ORDER — HEPARIN SODIUM (PORCINE) 5000 UNIT/ML IJ SOLN
5000.0000 [IU] | Freq: Three times a day (TID) | INTRAMUSCULAR | Status: DC
  Administered 2014-04-24 – 2014-05-01 (×21): 5000 [IU] via SUBCUTANEOUS
  Filled 2014-04-23 (×25): qty 1

## 2014-04-23 MED ORDER — ACETAMINOPHEN 650 MG RE SUPP
650.0000 mg | Freq: Once | RECTAL | Status: AC
  Administered 2014-04-23: 650 mg via RECTAL
  Filled 2014-04-23: qty 1

## 2014-04-23 MED ORDER — GUAIFENESIN ER 600 MG PO TB12
600.0000 mg | ORAL_TABLET | Freq: Two times a day (BID) | ORAL | Status: DC | PRN
  Administered 2014-04-25 – 2014-04-26 (×2): 600 mg via ORAL
  Filled 2014-04-23 (×4): qty 1

## 2014-04-23 MED ORDER — SODIUM CHLORIDE 0.9 % IV BOLUS (SEPSIS)
30.0000 mL/kg | Freq: Once | INTRAVENOUS | Status: AC
  Administered 2014-04-23: 1701 mL via INTRAVENOUS

## 2014-04-23 MED ORDER — ADULT MULTIVITAMIN W/MINERALS CH
1.0000 | ORAL_TABLET | Freq: Every day | ORAL | Status: DC
  Administered 2014-04-24 – 2014-05-01 (×8): 1 via ORAL
  Filled 2014-04-23 (×8): qty 1

## 2014-04-23 MED ORDER — INSULIN ASPART 100 UNIT/ML ~~LOC~~ SOLN
0.0000 [IU] | Freq: Three times a day (TID) | SUBCUTANEOUS | Status: DC
  Administered 2014-04-24: 1 [IU] via SUBCUTANEOUS
  Administered 2014-04-25: 2 [IU] via SUBCUTANEOUS
  Administered 2014-04-26 – 2014-05-01 (×5): 1 [IU] via SUBCUTANEOUS

## 2014-04-23 MED ORDER — CRANBERRY 1000 MG PO CAPS: 2000.0000 mg | ORAL_CAPSULE | Freq: Every day | ORAL | Status: DC

## 2014-04-23 MED ORDER — NAPHAZOLINE HCL 0.1 % OP SOLN
1.0000 [drp] | Freq: Four times a day (QID) | OPHTHALMIC | Status: DC | PRN
  Administered 2014-04-27: 1 [drp] via OPHTHALMIC
  Filled 2014-04-23: qty 15

## 2014-04-23 MED ORDER — CALCIUM CARBONATE-VITAMIN D 500-200 MG-UNIT PO TABS
1.0000 | ORAL_TABLET | Freq: Two times a day (BID) | ORAL | Status: DC
  Administered 2014-04-24 – 2014-05-01 (×14): 1 via ORAL
  Filled 2014-04-23 (×18): qty 1

## 2014-04-23 MED ORDER — PIPERACILLIN-TAZOBACTAM 3.375 G IVPB
3.3750 g | Freq: Three times a day (TID) | INTRAVENOUS | Status: DC
  Administered 2014-04-23 – 2014-04-26 (×7): 3.375 g via INTRAVENOUS
  Filled 2014-04-23 (×11): qty 50

## 2014-04-23 MED ORDER — PANTOPRAZOLE SODIUM 40 MG PO TBEC
40.0000 mg | DELAYED_RELEASE_TABLET | Freq: Every day | ORAL | Status: DC
  Administered 2014-04-24 – 2014-05-01 (×8): 40 mg via ORAL
  Filled 2014-04-23 (×8): qty 1

## 2014-04-23 MED ORDER — ALBUTEROL SULFATE (2.5 MG/3ML) 0.083% IN NEBU
2.5000 mg | INHALATION_SOLUTION | Freq: Four times a day (QID) | RESPIRATORY_TRACT | Status: DC
  Administered 2014-04-24 (×2): 2.5 mg via RESPIRATORY_TRACT
  Filled 2014-04-23 (×2): qty 3

## 2014-04-23 MED ORDER — VANCOMYCIN HCL IN DEXTROSE 750-5 MG/150ML-% IV SOLN
750.0000 mg | INTRAVENOUS | Status: DC
  Filled 2014-04-23: qty 150

## 2014-04-23 MED ORDER — ALPRAZOLAM 0.25 MG PO TABS
0.1250 mg | ORAL_TABLET | Freq: Every day | ORAL | Status: DC
  Administered 2014-04-24 (×2): 0.125 mg via ORAL
  Filled 2014-04-23 (×2): qty 1

## 2014-04-23 MED ORDER — POLYETHYLENE GLYCOL 3350 17 G PO PACK
17.0000 g | PACK | Freq: Every day | ORAL | Status: DC | PRN
  Filled 2014-04-23: qty 1

## 2014-04-23 MED ORDER — MORPHINE SULFATE 4 MG/ML IJ SOLN
4.0000 mg | Freq: Once | INTRAMUSCULAR | Status: AC
  Administered 2014-04-23: 4 mg via INTRAVENOUS
  Filled 2014-04-23: qty 1

## 2014-04-23 MED ORDER — SODIUM CHLORIDE 0.9 % IV BOLUS (SEPSIS)
30.0000 mL/kg | Freq: Once | INTRAVENOUS | Status: AC
  Administered 2014-04-23: 1000 mL via INTRAVENOUS

## 2014-04-23 MED ORDER — VANCOMYCIN HCL IN DEXTROSE 1-5 GM/200ML-% IV SOLN
1000.0000 mg | Freq: Once | INTRAVENOUS | Status: AC
  Administered 2014-04-23: 1000 mg via INTRAVENOUS
  Filled 2014-04-23: qty 200

## 2014-04-23 MED ORDER — DOCUSATE SODIUM 100 MG PO CAPS
200.0000 mg | ORAL_CAPSULE | Freq: Every day | ORAL | Status: DC
  Administered 2014-04-24 – 2014-04-30 (×7): 200 mg via ORAL
  Filled 2014-04-23 (×9): qty 2

## 2014-04-23 MED ORDER — ASPIRIN 81 MG PO CHEW
324.0000 mg | CHEWABLE_TABLET | Freq: Once | ORAL | Status: AC
  Administered 2014-04-23: 324 mg via ORAL
  Filled 2014-04-23: qty 4

## 2014-04-23 MED ORDER — SODIUM CHLORIDE 0.9 % IV SOLN
INTRAVENOUS | Status: DC
  Administered 2014-04-24 – 2014-04-26 (×2): via INTRAVENOUS

## 2014-04-23 MED ORDER — ACETAMINOPHEN 650 MG RE SUPP: 650.0000 mg | Freq: Four times a day (QID) | RECTAL | Status: DC | PRN

## 2014-04-23 MED ORDER — ASPIRIN EC 81 MG PO TBEC
81.0000 mg | DELAYED_RELEASE_TABLET | Freq: Every day | ORAL | Status: DC
  Administered 2014-04-24 – 2014-05-01 (×8): 81 mg via ORAL
  Filled 2014-04-23 (×8): qty 1

## 2014-04-23 MED ORDER — HYDROCODONE-HOMATROPINE 5-1.5 MG/5ML PO SYRP
5.0000 mL | ORAL_SOLUTION | Freq: Four times a day (QID) | ORAL | Status: DC | PRN
  Administered 2014-04-24 – 2014-04-30 (×13): 5 mL via ORAL
  Filled 2014-04-23 (×14): qty 5

## 2014-04-23 MED ORDER — VITAMIN B-12 1000 MCG PO TABS
1000.0000 ug | ORAL_TABLET | Freq: Every day | ORAL | Status: DC
  Administered 2014-04-24 – 2014-05-01 (×8): 1000 ug via ORAL
  Filled 2014-04-23 (×11): qty 1

## 2014-04-23 MED ORDER — PROBIOTIC PO CAPS: 1.0000 | ORAL_CAPSULE | Freq: Every day | ORAL | Status: DC

## 2014-04-23 MED ORDER — RISAQUAD PO CAPS
1.0000 | ORAL_CAPSULE | Freq: Every day | ORAL | Status: DC
  Administered 2014-04-24 – 2014-05-01 (×8): 1 via ORAL
  Filled 2014-04-23 (×8): qty 1

## 2014-04-23 NOTE — ED Notes (Signed)
Per EMS pt is from home, pt resides with her daughter. Per daughter per EMS, pt was treated for pneumonia and a UTI on 9/18. Pt has declined since then, daughter reports pt was ambulatory prior to her diagnostic, pt is now rigid and has not been getting up. Daughter reports pt has a torn tendon in her left shoulder as well and has been guarding her arm. EMS also reports pt states her legs hurt. Pt warm to the touch, ST on the monitor. A&O X1.

## 2014-04-23 NOTE — ED Notes (Signed)
Babette [phone]; [phone] (Daughter) Ferdinand CavaShiela [phone](Cousin)

## 2014-04-23 NOTE — ED Provider Notes (Signed)
CSN: [id number]     Arrival date & time 04/23/14  2014 History   First MD Initiated Contact with Patient 04/23/14 2027     Chief Complaint  Patient presents with  . Chest Pain  . Shortness of Breath  . Weakness     (Consider location/radiation/quality/duration/timing/severity/associated sxs/prior Treatment) HPI  Wendy Vasquez is a 80 y.o. female with past medical history significant for non-insulin-dependent diabetes, hypertension, hyperlipidemia, lives at home with her daughter who provides the history. States that patient has been in and out of the hospital for bronchitis and pneumonia over the last month. She's been compliant with her medications but has never really improved from the cough and chest pain which she's had her baseline. She's been running fevers at home which are has been giving her acetaminophen for her. She's been eating and drinking normally, no nausea or vomiting. She's complaining of chest pain  Past Medical History  Diagnosis Date  . Diabetes mellitus without complication   . Hypertension   . Asthma   . HLD (hyperlipidemia)   . Bleeding ulcer     requiring transfusions   Past Surgical History  Procedure Laterality Date  . Joint replacement    . Joint replacement    . Total knee arthroplasty Bilateral    Family History  Problem Relation Age of Onset  . Colon cancer Mother    History  Substance Use Topics  . Smoking status: Former Smoker    Types: Cigarettes  . Smokeless tobacco: Former Neurosurgeon  . Alcohol Use: No   OB History   Grav Para Term Preterm Abortions TAB SAB Ect Mult Living                 Review of Systems  10 systems reviewed and found to be negative, except as noted in the HPI.   Allergies  Ciprofloxacin and Sulphur  Home Medications   Prior to Admission medications   Medication Sig Start Date End Date Taking? Authorizing Provider  acetaminophen (TYLENOL) 500 MG tablet Take 1,000 mg by mouth 4 (four) times daily. Take everyday  per daughter   Yes Historical Provider, MD  ADVAIR DISKUS 250-50 MCG/DOSE AEPB Inhale 1 puff into the lungs 2 (two) times daily.  09/27/13  Yes Historical Provider, MD  ALPRAZolam Prudy Feeler) 0.25 MG tablet Take 0.5 tablets by mouth at bedtime.  11/06/13  Yes Historical Provider, MD  amoxicillin-clavulanate (AUGMENTIN) 500-125 MG per tablet Take 1 tablet by mouth 2 (two) times daily. Started medication on 04-14-14 04/13/14  Yes Dionne Ano, MD  aspirin 81 MG tablet Take 81 mg by mouth daily.   Yes Historical Provider, MD  calcium-vitamin D (OSCAL WITH D) 500-200 MG-UNIT per tablet Take 1 tablet by mouth 2 (two) times daily.   Yes Historical Provider, MD  Cranberry 1000 MG CAPS Take 2,000 mg by mouth daily with breakfast.    Yes Historical Provider, MD  docusate sodium (COLACE) 100 MG capsule Take 200 mg by mouth at bedtime.   Yes Historical Provider, MD  esomeprazole (NEXIUM) 40 MG capsule Take 40 mg by mouth at bedtime.    Yes Historical Provider, MD  gemfibrozil (LOPID) 600 MG tablet Take 600 mg by mouth 2 (two) times daily.  10/23/13  Yes Historical Provider, MD  guaiFENesin (MUCINEX) 600 MG 12 hr tablet Take 600 mg by mouth 2 (two) times daily as needed for cough or to loosen phlegm.   Yes Historical Provider, MD  HYDROcodone-homatropine (HYCODAN) 5-1.5 MG/5ML syrup Take 5  mLs by mouth every 6 (six) hours as needed for cough.   Yes Historical Provider, MD  metoCLOPramide (REGLAN) 10 MG tablet Take 10 mg by mouth daily with breakfast.    Yes Historical Provider, MD  Multiple Vitamin (MULTIVITAMIN WITH MINERALS) TABS tablet Take 1 tablet by mouth daily.   Yes Historical Provider, MD  polyethylene glycol (MIRALAX / GLYCOLAX) packet Take 17 g by mouth daily as needed for mild constipation.   Yes Historical Provider, MD  Probiotic Product (PROBIOTIC PO) Take 1 capsule by mouth daily.   Yes Historical Provider, MD  tetrahydrozoline (VISINE) 0.05 % ophthalmic solution Place 1 drop into both eyes daily as needed  (for dry eyes).   Yes Historical Provider, MD  vitamin B-12 (CYANOCOBALAMIN) 1000 MCG tablet Take 1,000 mcg by mouth daily with breakfast.    Yes Historical Provider, MD   BP 127/58  Pulse 90  Temp(Src) 99.1 F (37.3 C) (Oral)  Resp 23  Ht 5\' 5"  (1.651 m)  Wt 124 lb 5.4 oz (56.4 kg)  BMI 20.69 kg/m2  SpO2 90% Physical Exam  Nursing note and vitals reviewed. Constitutional: She appears distressed.  Moaning  HENT:  Head: Normocephalic.  Mouth/Throat: Oropharynx is clear and moist.  Eyes: Conjunctivae and EOM are normal. Pupils are equal, round, and reactive to light.  Neck: Normal range of motion. Neck supple.  Cardiovascular: Regular rhythm and intact distal pulses.   Pulmonary/Chest: No stridor. No respiratory distress. She has no wheezes. She has no rales. She exhibits no tenderness.  Tachypnea  Abdominal: Soft.  Musculoskeletal: Normal range of motion.  Generally rigid, high muscle tone, daughter states that this is normal her baseline.  Neurological: She is alert.  Answers in yes or no  Skin:  Petechial rash to bilateral temporal areas and Right thigh  Psychiatric: She has a normal mood and affect.    ED Course  Procedures (including critical care time) Labs Review Labs Reviewed  CBC WITH DIFFERENTIAL - Abnormal; Notable for the following:    WBC 13.5 (*)    Neutrophils Relative % 86 (*)    Neutro Abs 11.6 (*)    Lymphocytes Relative 6 (*)    Monocytes Absolute 1.1 (*)    All other components within normal limits  COMPREHENSIVE METABOLIC PANEL - Abnormal; Notable for the following:    Sodium 135 (*)    Glucose, Bld 176 (*)    Albumin 3.1 (*)    GFR calc non Af Amer 63 (*)    GFR calc Af Amer 73 (*)    All other components within normal limits  URINALYSIS, ROUTINE W REFLEX MICROSCOPIC - Abnormal; Notable for the following:    Hgb urine dipstick TRACE (*)    All other components within normal limits  APTT - Abnormal; Notable for the following:    aPTT 41 (*)     All other components within normal limits  PROTIME-INR - Abnormal; Notable for the following:    Prothrombin Time 15.4 (*)    All other components within normal limits  GLUCOSE, CAPILLARY - Abnormal; Notable for the following:    Glucose-Capillary 134 (*)    All other components within normal limits  I-STAT CHEM 8, ED - Abnormal; Notable for the following:    Glucose, Bld 179 (*)    All other components within normal limits  CULTURE, BLOOD (ROUTINE X 2)  CULTURE, BLOOD (ROUTINE X 2)  URINE CULTURE  PRO B NATRIURETIC PEPTIDE  URINE MICROSCOPIC-ADD ON  BASIC METABOLIC  PANEL  CBC  HEMOGLOBIN A1C  I-STAT CG4 LACTIC ACID, ED  I-STAT TROPOININ, ED  I-STAT TROPOININ, ED  I-STAT TROPOININ, ED    Imaging Review Ct Head Wo Contrast  04/23/2014   CLINICAL DATA:  Generalized weakness, acute onset. Initial encounter.  EXAM: CT HEAD WITHOUT CONTRAST  TECHNIQUE: Contiguous axial images were obtained from the base of the skull through the vertex without intravenous contrast.  COMPARISON:  None.  FINDINGS: There is no evidence of acute infarction, or intra- or extra-axial hemorrhage on CT.  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 Prominence of the ventricles and sulci reflects mild to moderate cortical volume loss. Mild cerebellar atrophy is noted. Scattered periventricular white matter change likely reflects small vessel ischemic microangiopathy.  The brainstem and fourth ventricle are within normal limits. The basal ganglia are unremarkable in appearance. The cerebral hemispheres demonstrate grossly normal gray-white differentiation. No mass effect or midline shift is seen.  There is no evidence of fracture; visualized osseous structures are unremarkable in appearance. The visualized portions of the orbits are within normal limits. The paranasal sinuses and mastoid air cells are well-aerated. No significant soft tissue abnormalities are seen.  IMPRESSION: 1.  No acute intracranial pathology seen on CT. 2. 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 Sudden death due to a colloid cyst at this age is rare, but would usually present with headaches and/or vomiting. 3. Mild to moderate cortical volume loss and scattered small vessel ischemic microangiopathy.   Electronically Signed   By: Roanna Raider M.D.   On: 04/23/2014 22:17   Dg Chest Port 1 View  04/23/2014   CLINICAL DATA:  Code sepsis. Acute onset of diffuse chest pain, shortness of breath and weakness. Initial encounter.  EXAM: PORTABLE CHEST - 1 VIEW  COMPARISON:  Chest radiograph performed 04/12/2014  FINDINGS: The lungs are well-aerated and clear. There is no evidence of focal opacification, pleural effusion or pneumothorax.  The cardiomediastinal silhouette is borderline enlarged. No acute osseous abnormalities are seen. Sclerotic change within the right humeral head and neck likely reflects a remote bone infarct.  IMPRESSION: No acute cardiopulmonary process seen. Borderline cardiomegaly noted.   Electronically Signed   By: Roanna Raider M.D.   On: 04/23/2014 21:30     EKG Interpretation   Date/Time:  Monday April 23 2014 20:14:16 EDT Ventricular Rate:  126 PR Interval:  153 QRS Duration: 77 QT Interval:  300 QTC Calculation: 434 R Axis:   37 Text Interpretation:  Age not entered, assumed to be  80 years old for  purpose of ECG interpretation Sinus tachycardia Atrial premature complex  Nonspecific T abnormalities, diffuse leads Confirmed by Freida Busman  MD, ANTHONY  ([id number]) on 04/23/2014 9:58:28 PM      MDM   Final diagnoses:  SIRS (systemic inflammatory response syndrome)  Cough  Chest pain, unspecified chest pain type    Filed Vitals:   04/23/14 2245 04/23/14 2300 04/23/14 2315 04/23/14 2355  BP: 116/45 103/48 107/53 127/58  Pulse: 89 86 85 90  Temp:    99.1 F (37.3 C)  TempSrc:    Oral  Resp: 21 21 21 23   Height:    5\' 5"  (1.651 m)  Weight:    124 lb  5.4 oz (56.4 kg)  SpO2: 95% 95% 95% 90%    Medications  vancomycin (VANCOCIN) IVPB 750 mg/150 ml premix (not administered)  piperacillin-tazobactam (ZOSYN) IVPB 3.375 g (  0 g Intravenous Stopped 04/23/14 2254)  acetaminophen (TYLENOL) suppository 650 mg (not administered)  aspirin EC tablet 81 mg (not administered)  docusate sodium (COLACE) capsule 200 mg (200 mg Oral Given 04/24/14 0057)  HYDROcodone-homatropine (HYCODAN) 5-1.5 MG/5ML syrup 5 mL (not administered)  naphazoline (NAPHCON) 0.1 % ophthalmic solution 1 drop (not administered)  pantoprazole (PROTONIX) EC tablet 40 mg (not administered)  guaiFENesin (MUCINEX) 12 hr tablet 600 mg (not administered)  metoCLOPramide (REGLAN) tablet 10 mg (not administered)  polyethylene glycol (MIRALAX / GLYCOLAX) packet 17 g (not administered)  mometasone-formoterol (DULERA) 100-5 MCG/ACT inhaler 2 puff (not administered)  ALPRAZolam (XANAX) tablet 0.125 mg (0.125 mg Oral Given 04/24/14 0056)  calcium-vitamin D (OSCAL WITH D) 500-200 MG-UNIT per tablet 1 tablet (not administered)  gemfibrozil (LOPID) tablet 600 mg (not administered)  multivitamin with minerals tablet 1 tablet (not administered)  vitamin B-12 (CYANOCOBALAMIN) tablet 1,000 mcg (not administered)  heparin injection 5,000 Units (not administered)  albuterol (PROVENTIL) (2.5 MG/3ML) 0.083% nebulizer solution 2.5 mg (not administered)  insulin aspart (novoLOG) injection 0-9 Units (not administered)  0.9 %  sodium chloride infusion ( Intravenous New Bag/Given 04/24/14 0025)  acidophilus (RISAQUAD) capsule 1 capsule (not administered)  sodium chloride 0.9 % bolus 30 mL/kg (0 mL/kg Intravenous Stopped 04/23/14 2209)  aspirin chewable tablet 324 mg (324 mg Oral Given 04/23/14 2046)  acetaminophen (TYLENOL) suppository 650 mg (650 mg Rectal Given 04/23/14 2027)  morphine 4 MG/ML injection 4 mg (4 mg Intravenous Given 04/23/14 2125)  sodium chloride 0.9 % bolus 1,701 mL (0 mL/kg  56.7 kg  Intravenous Stopped 04/23/14 2253)  vancomycin (VANCOCIN) IVPB 1000 mg/200 mL premix (0 mg Intravenous Stopped 04/23/14 2322)    Leander RamsRuth K Genter is a 80 y.o. female presenting with cough, chest pain, febrile and tachycardic with tachypnea, she is saturating adequately on room air and 94%. Likely pneumonia. Patient had a hospital stay in the last 2 weeks. Will need hospital-acquired regimen. EKG is nonischemic, troponin is negative. She is a leukocytosis of 13.5. Vital signs are stable. Lactic acid is normal. Patient need search criteria. Chest x-ray shows no infiltrate but I think this is the likely source of her infection based on clinical findings. Patient also has a normal urinalysis. She has petechia A. around the temples, it is likely secondary to coughing and Valsalva.  This is a shared visit with the attending physician who personally evaluated the patient and agrees with the care plan.    Discussed with triad hospitalist Dr. Clyde LundborgNiu who accepts admission.      Joni Reiningicole Alianis Trimmer, PA-C 04/24/14 0100

## 2014-04-23 NOTE — H&P (Signed)
[facility] History and Physical  Wendy RamsRuth K Vary ZOX:[id number] DOB: Apr 26, 1924 DOA: 04/23/2014  Referring physician: ED physician PCP: Pamelia HoitWILSON,FRED HENRY, MD  Specialists:   Chief Complaint: cough, chest pain and fever.    HPI: Wendy Vasquez is a 80 y.o. female   with past medical history of hypertension, asthma, hyperlipidemia, and generalized deconditioning, gastric ulcer, who presents with cough, chest pain and fever.   The patient does not talk much and is very difficult to get accurate history. I obtained medical history by having called her daughter. Per her daughter, patient was recently hospitalized and treated for pneumonia (per dr. Vivien RossettiMallory's note, patient was initially treated for HCAP with cefepime and vancomycin, but then switched to CAP coverage). She was discharged home with continuation of oral Augmentin. Patient has been compliant to her medications, but her symptoms persisted. She still has productive cough with greenish sputum production. She also has chest pain and fever with temperature 100.0 at home. The symptoms has been progressively getting worse. She was brought back to the hospital for further evaluation and treatment. She does not have nausea, vomiting, diarrhea, no rashes. Patient was found to have leukocytosis with WBC 13.5 and a fever with a temperature 100.0. Her portal chest x-ray is negative for pneumonia. She is admitted to inpatient for further evaluation and treatment.   Review of Systems: As presented in the history of presenting illness, rest negative.  Where does patient live? Lives with her daughter at home  Can patient participate in ADLs? no  Allergy:  Allergies  Allergen Reactions  . Ciprofloxacin Hives  . Sulphur [Sulfur] Anaphylaxis    Past Medical History  Diagnosis Date  . Diabetes mellitus without complication   . Hypertension   . Asthma   . HLD (hyperlipidemia)   . Bleeding ulcer     requiring transfusions    Past Surgical  History  Procedure Laterality Date  . Joint replacement    . Joint replacement    . Total knee arthroplasty Bilateral     Social History:  reports that she has quit smoking. Her smoking use included Cigarettes. She smoked 0.00 packs per day. She has quit using smokeless tobacco. She reports that she does not drink alcohol or use illicit drugs.  Family History:  Family History  Problem Relation Age of Onset  . Colon cancer Mother      Prior to Admission medications   Medication Sig Start Date End Date Taking? Authorizing Provider  acetaminophen (TYLENOL) 500 MG tablet Take 1,000 mg by mouth 4 (four) times daily. Take everyday per daughter   Yes Historical Provider, MD  ADVAIR DISKUS 250-50 MCG/DOSE AEPB Inhale 1 puff into the lungs 2 (two) times daily.  09/27/13  Yes Historical Provider, MD  ALPRAZolam Prudy Feeler(XANAX) 0.25 MG tablet Take 0.5 tablets by mouth at bedtime.  11/06/13  Yes Historical Provider, MD  amoxicillin-clavulanate (AUGMENTIN) 500-125 MG per tablet Take 1 tablet by mouth 2 (two) times daily. Started medication on 04-14-14 04/13/14  Yes Dionne AnoJulia Mallory, MD  aspirin 81 MG tablet Take 81 mg by mouth daily.   Yes Historical Provider, MD  calcium-vitamin D (OSCAL WITH D) 500-200 MG-UNIT per tablet Take 1 tablet by mouth 2 (two) times daily.   Yes Historical Provider, MD  Cranberry 1000 MG CAPS Take 2,000 mg by mouth daily with breakfast.    Yes Historical Provider, MD  docusate sodium (COLACE) 100 MG capsule Take 200 mg by mouth at bedtime.   Yes Historical Provider, MD  esomeprazole (NEXIUM) 40 MG capsule Take 40 mg by mouth at bedtime.    Yes Historical Provider, MD  gemfibrozil (LOPID) 600 MG tablet Take 600 mg by mouth 2 (two) times daily.  10/23/13  Yes Historical Provider, MD  guaiFENesin (MUCINEX) 600 MG 12 hr tablet Take 600 mg by mouth 2 (two) times daily as needed for cough or to loosen phlegm.   Yes Historical Provider, MD  HYDROcodone-homatropine (HYCODAN) 5-1.5 MG/5ML syrup Take  5 mLs by mouth every 6 (six) hours as needed for cough.   Yes Historical Provider, MD  metoCLOPramide (REGLAN) 10 MG tablet Take 10 mg by mouth daily with breakfast.    Yes Historical Provider, MD  Multiple Vitamin (MULTIVITAMIN WITH MINERALS) TABS tablet Take 1 tablet by mouth daily.   Yes Historical Provider, MD  polyethylene glycol (MIRALAX / GLYCOLAX) packet Take 17 g by mouth daily as needed for mild constipation.   Yes Historical Provider, MD  Probiotic Product (PROBIOTIC PO) Take 1 capsule by mouth daily.   Yes Historical Provider, MD  tetrahydrozoline (VISINE) 0.05 % ophthalmic solution Place 1 drop into both eyes daily as needed (for dry eyes).   Yes Historical Provider, MD  vitamin B-12 (CYANOCOBALAMIN) 1000 MCG tablet Take 1,000 mcg by mouth daily with breakfast.    Yes Historical Provider, MD    Physical Exam: Filed Vitals:   04/23/14 2230 04/23/14 2245 04/23/14 2300 04/23/14 2315  BP: 108/53 116/45 103/48 107/53  Pulse: 88 89 86 85  Temp:      TempSrc:      Resp: 23 21 21 21   Height:      Weight:      SpO2: 95% 95% 95% 95%   General: Not in acute distress HEENT:       Eyes: PERRL, EOMI, no scleral icterus       ENT: No discharge from the ears and nose, no pharynx injection, no tonsillar enlargement.        Neck: No JVD, no bruit, no mass felt. Cardiac: S1/S2, RRR, No murmurs, gallops or rubs Pulm: decreased air movement bilaterally, occasional crackles and mild wheezes posteriorly. Attempts to cough, but has a weak cough effort   Abd: Soft, nondistended, nontender, no rebound pain, no organomegaly, BS present Ext: No edema. 2+DP/PT pulse bilaterally Musculoskeletal:  Skin: No rashes.  Neuro: Oriented X3, does not follow commands for examination. cranial nerves II-XII grossly intact. Left arm has spasm and left leg is weak.  Psych: Patient is not psychotic, no suicidal or hemocidal ideation.  Labs on Admission:  Basic Metabolic Panel:  Recent Labs Lab 04/23/14 2047   NA 135*  137  K 4.1  4.0  CL 99  104  CO2 23  GLUCOSE 176*  179*  BUN 13  12  CREATININE 0.81  0.90  CALCIUM 10.2   Liver Function Tests:  Recent Labs Lab 04/23/14 2047  AST 20  ALT 6  ALKPHOS 113  BILITOT 0.4  PROT 7.5  ALBUMIN 3.1*   No results found for this basename: LIPASE, AMYLASE,  in the last 168 hours No results found for this basename: AMMONIA,  in the last 168 hours CBC:  Recent Labs Lab 04/23/14 2047  WBC 13.5*  NEUTROABS 11.6*  HGB 12.0  13.9  HCT 36.5  41.0  MCV 94.1  PLT 383   Cardiac Enzymes: No results found for this basename: CKTOTAL, CKMB, CKMBINDEX, TROPONINI,  in the last 168 hours  BNP (last 3 results)  Recent Labs  04/11/14 1010 04/23/14 2048  PROBNP 191.5 403.4   CBG: No results found for this basename: GLUCAP,  in the last 168 hours  Radiological Exams on Admission: Ct Head Wo Contrast  04/23/2014   CLINICAL DATA:  Generalized weakness, acute onset. Initial encounter.  EXAM: CT HEAD WITHOUT CONTRAST  TECHNIQUE: Contiguous axial images were obtained from the base of the skull through the vertex without intravenous contrast.  COMPARISON:  None.  FINDINGS: There is no evidence of acute infarction, or intra- or extra-axial hemorrhage on CT.  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 Prominence of the ventricles and sulci reflects mild to moderate cortical volume loss. Mild cerebellar atrophy is noted. Scattered periventricular white matter change likely reflects small vessel ischemic microangiopathy.  The brainstem and fourth ventricle are within normal limits. The basal ganglia are unremarkable in appearance. The cerebral hemispheres demonstrate grossly normal gray-white differentiation. No mass effect or midline shift is seen.  There is no evidence of fracture; visualized osseous structures are unremarkable in appearance. The visualized portions of the orbits are within normal limits. The  paranasal sinuses and mastoid air cells are well-aerated. No significant soft tissue abnormalities are seen.  IMPRESSION: 1. No acute intracranial pathology seen on CT. 2. 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 Sudden death due to a colloid cyst at this age is rare, but would usually present with headaches and/or vomiting. 3. Mild to moderate cortical volume loss and scattered small vessel ischemic microangiopathy.   Electronically Signed   By: Roanna Raider M.D.   On: 04/23/2014 22:17   Dg Chest Port 1 View  04/23/2014   CLINICAL DATA:  Code sepsis. Acute onset of diffuse chest pain, shortness of breath and weakness. Initial encounter.  EXAM: PORTABLE CHEST - 1 VIEW  COMPARISON:  Chest radiograph performed 04/12/2014  FINDINGS: The lungs are well-aerated and clear. There is no evidence of focal opacification, pleural effusion or pneumothorax.  The cardiomediastinal silhouette is borderline enlarged. No acute osseous abnormalities are seen. Sclerotic change within the right humeral head and neck likely reflects a remote bone infarct.  IMPRESSION: No acute cardiopulmonary process seen. Borderline cardiomegaly noted.   Electronically Signed   By: Roanna Raider M.D.   On: 04/23/2014 21:30    EKG: Independently reviewed.   Assessment/Plan Principal Problem:   HCAP (healthcare-associated pneumonia) Active Problems:   HTN (hypertension)   HLD (hyperlipidemia)   Asthma   Sepsis  1. HCAP and sepsis: Patient's presentations, including productive cough, fever and leukocytosis are consistent with pneumonia though initial chest x-ray was negative. She has mild sepsis given her tachycardia, leukocytosis and fever. Vancomycin and Zosyn was started in ED.   -admit to med-surg bed. -repeat 2 view CXR -will continue IV Vancomycin, and zosyn for treatment as HCAP. -Supplement her oxygen as needed. - blood cultures x 2 -Urine Strep and Legionella antigen. -Sputum cultures. -  Guaifenesin for cough - IVF: NS 100/h  2. Asthma: Patient's presentation is not mainly caused by her asthma exacerbation, given her wheezing is very mild on lung auscultation - Albuterol inhaler - Dulera inhaler  3. HTN: bp is well controlled. Not on medications at home  -monitor bp closely given she is septic now.    DVT ppx: SQ Heparin          Code Status: Full code Family Communication: Yes, patient's daughter called Disposition Plan: Admit to inpatient  Lorretta Harp  [facility] Pager [phone]  If 7PM-7AM, please contact night-coverage [email or web address] Password [email or web address] 04/23/2014, 11:43 PM

## 2014-04-23 NOTE — Consult Note (Signed)
ANTIBIOTIC CONSULT NOTE - INITIAL  Pharmacy Consult for Vancomycin and Zosyn Indication: r/o sepsis  Allergies  Allergen Reactions  . Ciprofloxacin Hives  . Sulphur [Sulfur] Anaphylaxis    Patient Measurements: Height: 5\' 5"  (165.1 cm) Weight: 125 lb (56.7 kg) IBW/kg (Calculated) : 57  Vital Signs: Temp: 101.3 F (38.5 C) (10/05 2012) Temp Source: Rectal (10/05 2012) BP: 121/61 mmHg (10/05 2115) Pulse Rate: 94 (10/05 2115) Intake/Output from previous day:   Intake/Output from this shift:    Labs:  Recent Labs  04/23/14 2047  WBC 13.5*  HGB 12.0  13.9  PLT 383  CREATININE 0.81  0.90   Estimated Creatinine Clearance: 43 ml/min (by C-G formula based on Cr of 0.81).  Microbiology: Recent Results (from the past 720 hour(s))  CULTURE, BLOOD (ROUTINE X 2)     Status: None   Collection Time    04/11/14  2:10 PM      Result Value Ref Range Status   Specimen Description BLOOD LEFT HAND   Final   Special Requests BOTTLES DRAWN AEROBIC AND ANAEROBIC 5CC   Final   Culture  Setup Time     Final   Value: 04/11/2014 22:36     Performed at [facility]   Culture     Final   Value: NO GROWTH 5 DAYS     Performed at [facility]   Report Status 04/17/2014 FINAL   Final  CULTURE, BLOOD (ROUTINE X 2)     Status: None   Collection Time    04/11/14  2:20 PM      Result Value Ref Range Status   Specimen Description BLOOD RIGHT HAND   Final   Special Requests BOTTLES DRAWN AEROBIC ONLY 8CC   Final   Culture  Setup Time     Final   Value: 04/11/2014 22:35     Performed at [facility]   Culture     Final   Value: NO GROWTH 5 DAYS     Performed at [facility]   Report Status 04/17/2014 FINAL   Final    Medical History: Past Medical History  Diagnosis Date  . Diabetes mellitus without complication   . Hypertension   . Asthma   . HLD (hyperlipidemia)   . Bleeding ulcer     requiring transfusions   Assessment: 89yof recently  [facility] after admission for CAP that failed outpatient doxycycline returns to the ED with fever and gradual decline. She was previously treated with vancomycin and cefepime then discharged on augmentin. She will now begin vancomycin and zosyn for possible sepsis.  Renal function stable.  Goal of Therapy:  Vancomycin trough level 15-20 mcg/ml  Plan:  1) Vancomycin 1g IV x 1 then 750mg  IV q24 2) Zosyn 3.375g IV q8 (4 hour infusion) 3) Follow renal function, cultures, LOT, level if needed  Fredrik RiggerMarkle, Taeja Debellis Sue 04/23/2014,10:04 PM

## 2014-04-23 NOTE — Progress Notes (Signed)
Received report from Becky, RN.  

## 2014-04-23 NOTE — ED Notes (Signed)
MD at bedside. 

## 2014-04-23 NOTE — ED Notes (Signed)
PA at BS.  

## 2014-04-24 ENCOUNTER — Observation Stay (HOSPITAL_COMMUNITY): Payer: Medicare Other

## 2014-04-24 ENCOUNTER — Encounter (HOSPITAL_COMMUNITY): Payer: Self-pay | Admitting: General Practice

## 2014-04-24 DIAGNOSIS — J90 Pleural effusion, not elsewhere classified: Secondary | ICD-10-CM | POA: Diagnosis present

## 2014-04-24 DIAGNOSIS — E785 Hyperlipidemia, unspecified: Secondary | ICD-10-CM | POA: Diagnosis present

## 2014-04-24 DIAGNOSIS — I10 Essential (primary) hypertension: Secondary | ICD-10-CM

## 2014-04-24 DIAGNOSIS — Z7982 Long term (current) use of aspirin: Secondary | ICD-10-CM | POA: Diagnosis not present

## 2014-04-24 DIAGNOSIS — I959 Hypotension, unspecified: Secondary | ICD-10-CM

## 2014-04-24 DIAGNOSIS — K219 Gastro-esophageal reflux disease without esophagitis: Secondary | ICD-10-CM | POA: Diagnosis present

## 2014-04-24 DIAGNOSIS — Y95 Nosocomial condition: Secondary | ICD-10-CM | POA: Diagnosis present

## 2014-04-24 DIAGNOSIS — Z96653 Presence of artificial knee joint, bilateral: Secondary | ICD-10-CM | POA: Diagnosis present

## 2014-04-24 DIAGNOSIS — Z87891 Personal history of nicotine dependence: Secondary | ICD-10-CM | POA: Diagnosis not present

## 2014-04-24 DIAGNOSIS — A419 Sepsis, unspecified organism: Secondary | ICD-10-CM | POA: Diagnosis present

## 2014-04-24 DIAGNOSIS — I313 Pericardial effusion (noninflammatory): Secondary | ICD-10-CM | POA: Diagnosis present

## 2014-04-24 DIAGNOSIS — E119 Type 2 diabetes mellitus without complications: Secondary | ICD-10-CM | POA: Diagnosis present

## 2014-04-24 DIAGNOSIS — Z79899 Other long term (current) drug therapy: Secondary | ICD-10-CM | POA: Diagnosis not present

## 2014-04-24 DIAGNOSIS — R233 Spontaneous ecchymoses: Secondary | ICD-10-CM | POA: Diagnosis present

## 2014-04-24 DIAGNOSIS — J189 Pneumonia, unspecified organism: Secondary | ICD-10-CM | POA: Diagnosis present

## 2014-04-24 DIAGNOSIS — Z6820 Body mass index (BMI) 20.0-20.9, adult: Secondary | ICD-10-CM | POA: Diagnosis not present

## 2014-04-24 DIAGNOSIS — F419 Anxiety disorder, unspecified: Secondary | ICD-10-CM

## 2014-04-24 DIAGNOSIS — J45901 Unspecified asthma with (acute) exacerbation: Secondary | ICD-10-CM | POA: Diagnosis present

## 2014-04-24 DIAGNOSIS — G20 Parkinson's disease: Secondary | ICD-10-CM | POA: Diagnosis present

## 2014-04-24 DIAGNOSIS — J9601 Acute respiratory failure with hypoxia: Secondary | ICD-10-CM | POA: Diagnosis not present

## 2014-04-24 DIAGNOSIS — Z8711 Personal history of peptic ulcer disease: Secondary | ICD-10-CM | POA: Diagnosis not present

## 2014-04-24 DIAGNOSIS — D72810 Lymphocytopenia: Secondary | ICD-10-CM | POA: Diagnosis present

## 2014-04-24 DIAGNOSIS — J129 Viral pneumonia, unspecified: Secondary | ICD-10-CM | POA: Diagnosis present

## 2014-04-24 DIAGNOSIS — I4891 Unspecified atrial fibrillation: Secondary | ICD-10-CM | POA: Diagnosis present

## 2014-04-24 DIAGNOSIS — F0390 Unspecified dementia without behavioral disturbance: Secondary | ICD-10-CM | POA: Diagnosis present

## 2014-04-24 DIAGNOSIS — R269 Unspecified abnormalities of gait and mobility: Secondary | ICD-10-CM | POA: Diagnosis present

## 2014-04-24 DIAGNOSIS — G92 Toxic encephalopathy: Secondary | ICD-10-CM

## 2014-04-24 DIAGNOSIS — R05 Cough: Secondary | ICD-10-CM | POA: Diagnosis present

## 2014-04-24 DIAGNOSIS — I679 Cerebrovascular disease, unspecified: Secondary | ICD-10-CM | POA: Diagnosis present

## 2014-04-24 DIAGNOSIS — J45909 Unspecified asthma, uncomplicated: Secondary | ICD-10-CM

## 2014-04-24 DIAGNOSIS — G9341 Metabolic encephalopathy: Secondary | ICD-10-CM | POA: Diagnosis present

## 2014-04-24 DIAGNOSIS — Z993 Dependence on wheelchair: Secondary | ICD-10-CM | POA: Diagnosis not present

## 2014-04-24 DIAGNOSIS — R000 Tachycardia, unspecified: Secondary | ICD-10-CM | POA: Diagnosis present

## 2014-04-24 LAB — CBC
HCT: 30.7 % — ABNORMAL LOW (ref 36.0–46.0)
HEMOGLOBIN: 9.9 g/dL — AB (ref 12.0–15.0)
MCH: 31.2 pg (ref 26.0–34.0)
MCHC: 32.2 g/dL (ref 30.0–36.0)
MCV: 96.8 fL (ref 78.0–100.0)
Platelets: 332 10*3/uL (ref 150–400)
RBC: 3.17 MIL/uL — AB (ref 3.87–5.11)
RDW: 13 % (ref 11.5–15.5)
WBC: 9.1 10*3/uL (ref 4.0–10.5)

## 2014-04-24 LAB — GLUCOSE, CAPILLARY
GLUCOSE-CAPILLARY: 131 mg/dL — AB (ref 70–99)
GLUCOSE-CAPILLARY: 134 mg/dL — AB (ref 70–99)
Glucose-Capillary: 112 mg/dL — ABNORMAL HIGH (ref 70–99)
Glucose-Capillary: 105 mg/dL — ABNORMAL HIGH (ref 70–99)
Glucose-Capillary: 145 mg/dL — ABNORMAL HIGH (ref 70–99)

## 2014-04-24 LAB — COMPREHENSIVE METABOLIC PANEL
ALBUMIN: 2.5 g/dL — AB (ref 3.5–5.2)
ALK PHOS: 81 U/L (ref 39–117)
ALT: 6 U/L (ref 0–35)
ANION GAP: 12 (ref 5–15)
AST: 15 U/L (ref 0–37)
BUN: 12 mg/dL (ref 6–23)
CO2: 22 mEq/L (ref 19–32)
Calcium: 9.2 mg/dL (ref 8.4–10.5)
Chloride: 103 mEq/L (ref 96–112)
Creatinine, Ser: 0.83 mg/dL (ref 0.50–1.10)
GFR calc Af Amer: 71 mL/min — ABNORMAL LOW (ref >90)
GFR calc non Af Amer: 61 mL/min — ABNORMAL LOW (ref >90)
Glucose, Bld: 147 mg/dL — ABNORMAL HIGH (ref 70–99)
POTASSIUM: 4.1 meq/L (ref 3.7–5.3)
SODIUM: 137 meq/L (ref 137–147)
TOTAL PROTEIN: 6.4 g/dL (ref 6.0–8.3)
Total Bilirubin: 0.3 mg/dL (ref 0.3–1.2)

## 2014-04-24 LAB — BASIC METABOLIC PANEL
Anion gap: 13 (ref 5–15)
BUN: 11 mg/dL (ref 6–23)
CO2: 20 mEq/L (ref 19–32)
Calcium: 9.6 mg/dL (ref 8.4–10.5)
Chloride: 106 mEq/L (ref 96–112)
Creatinine, Ser: 0.68 mg/dL (ref 0.50–1.10)
GFR calc Af Amer: 88 mL/min — ABNORMAL LOW (ref >90)
GFR calc non Af Amer: 76 mL/min — ABNORMAL LOW (ref >90)
Glucose, Bld: 116 mg/dL — ABNORMAL HIGH (ref 70–99)
POTASSIUM: 4.1 meq/L (ref 3.7–5.3)
SODIUM: 139 meq/L (ref 137–147)

## 2014-04-24 LAB — TROPONIN I

## 2014-04-24 LAB — PROCALCITONIN: Procalcitonin: 0.15 ng/mL

## 2014-04-24 LAB — D-DIMER, QUANTITATIVE (NOT AT ARMC): D-Dimer, Quant: 2.19 ug/mL-FEU — ABNORMAL HIGH (ref 0.00–0.48)

## 2014-04-24 LAB — MAGNESIUM: Magnesium: 1.8 mg/dL (ref 1.5–2.5)

## 2014-04-24 LAB — STREP PNEUMONIAE URINARY ANTIGEN: Strep Pneumo Urinary Antigen: NEGATIVE

## 2014-04-24 LAB — TSH: TSH: 1.89 u[IU]/mL (ref 0.350–4.500)

## 2014-04-24 LAB — HEMOGLOBIN A1C
Hgb A1c MFr Bld: 5.4 % (ref <5.7)
MEAN PLASMA GLUCOSE: 108 mg/dL (ref <117)

## 2014-04-24 MED ORDER — ALBUTEROL SULFATE (2.5 MG/3ML) 0.083% IN NEBU: 2.5000 mg | INHALATION_SOLUTION | Freq: Four times a day (QID) | RESPIRATORY_TRACT | Status: DC | PRN

## 2014-04-24 MED ORDER — ACETAMINOPHEN 325 MG PO TABS
650.0000 mg | ORAL_TABLET | Freq: Four times a day (QID) | ORAL | Status: DC | PRN
  Administered 2014-04-24 – 2014-04-29 (×7): 650 mg via ORAL
  Filled 2014-04-24 (×8): qty 2

## 2014-04-24 MED ORDER — VANCOMYCIN HCL IN DEXTROSE 750-5 MG/150ML-% IV SOLN
750.0000 mg | INTRAVENOUS | Status: DC
  Administered 2014-04-25 (×2): 750 mg via INTRAVENOUS
  Filled 2014-04-24 (×3): qty 150

## 2014-04-24 MED ORDER — AMIODARONE HCL IN DEXTROSE 360-4.14 MG/200ML-% IV SOLN
30.0000 mg/h | INTRAVENOUS | Status: DC
  Administered 2014-04-25 – 2014-04-26 (×2): 30 mg/h via INTRAVENOUS
  Filled 2014-04-24 (×7): qty 200

## 2014-04-24 MED ORDER — AMIODARONE HCL IN DEXTROSE 360-4.14 MG/200ML-% IV SOLN
60.0000 mg/h | INTRAVENOUS | Status: AC
  Administered 2014-04-24: 22:00:00 60 mg/h via INTRAVENOUS
  Filled 2014-04-24 (×2): qty 200

## 2014-04-24 MED ORDER — METOPROLOL TARTRATE 1 MG/ML IV SOLN
5.0000 mg | Freq: Once | INTRAVENOUS | Status: AC
  Administered 2014-04-24: 5 mg via INTRAVENOUS

## 2014-04-24 MED ORDER — AZITHROMYCIN 250 MG PO TABS
250.0000 mg | ORAL_TABLET | Freq: Every day | ORAL | Status: DC
  Filled 2014-04-24: qty 1

## 2014-04-24 MED ORDER — SODIUM CHLORIDE 0.9 % IV BOLUS (SEPSIS)
250.0000 mL | Freq: Once | INTRAVENOUS | Status: AC
  Administered 2014-04-24: 250 mL via INTRAVENOUS

## 2014-04-24 MED ORDER — SODIUM CHLORIDE 0.9 % IV BOLUS (SEPSIS)
500.0000 mL | Freq: Once | INTRAVENOUS | Status: AC
  Administered 2014-04-24: 500 mL via INTRAVENOUS

## 2014-04-24 MED ORDER — HYDROCODONE-HOMATROPINE 5-1.5 MG/5ML PO SYRP
5.0000 mL | ORAL_SOLUTION | Freq: Once | ORAL | Status: AC
  Administered 2014-04-24: 5 mL via ORAL

## 2014-04-24 MED ORDER — AZITHROMYCIN 500 MG PO TABS
500.0000 mg | ORAL_TABLET | Freq: Every day | ORAL | Status: AC
  Administered 2014-04-24: 500 mg via ORAL
  Filled 2014-04-24: qty 1

## 2014-04-24 MED ORDER — METOPROLOL TARTRATE 1 MG/ML IV SOLN
INTRAVENOUS | Status: AC
  Administered 2014-04-24: 5 mg
  Filled 2014-04-24: qty 5

## 2014-04-24 MED ORDER — METOPROLOL TARTRATE 1 MG/ML IV SOLN
5.0000 mg | Freq: Once | INTRAVENOUS | Status: DC
  Filled 2014-04-24: qty 5

## 2014-04-24 MED ORDER — MAGNESIUM SULFATE 40 MG/ML IJ SOLN
2.0000 g | Freq: Once | INTRAMUSCULAR | Status: AC
  Administered 2014-04-25: 01:00:00 2 g via INTRAVENOUS
  Filled 2014-04-24: qty 50

## 2014-04-24 MED ORDER — HYDROCOD POLST-CHLORPHEN POLST 10-8 MG/5ML PO LQCR
5.0000 mL | Freq: Once | ORAL | Status: AC
  Administered 2014-04-24: 5 mL via ORAL
  Filled 2014-04-24: qty 5

## 2014-04-24 NOTE — Consult Note (Addendum)
CARDIOLOGY CONSULT NOTE   Patient ID: Wendy Vasquez MRN: [id number], DOB/AGE: 80   Admit date: 04/23/2014 Date of Consult: 04/24/2014   Primary Physician: Wendy Seller, MD Primary Cardiologist: None  Pt. Profile  80F with HTN, HLD, DM, prior bleeding ulcers who is admitted to the hospital after failed treatment for pneumonia. Cardiology is consulted for new onset atrial fibrillation in the setting of spiking fevers.   Problem List  Past Medical History  Diagnosis Date  . Hypertension   . HLD (hyperlipidemia)   . Bleeding ulcer     requiring transfusions  . H/O hiatal hernia   . History of blood transfusion     "related to bleeding ulcers"  . Asthma     "a touch"  . Pneumonia X 1  . Diabetes mellitus without complication     "diet controlled"  . GERD (gastroesophageal reflux disease)   . Migraine     hx  . Daily headache     "last couple days" (04/24/2014)  . Arthritis     "all over"    Past Surgical History  Procedure Laterality Date  . Total knee arthroplasty Bilateral   . Joint replacement    . Cataract extraction  "years ago"    "don't know which side; don't know if they put lens in"  . Dilation and curettage of uterus      "after she lost a child, I'd assume"     Allergies  Allergies  Allergen Reactions  . Ciprofloxacin Hives  . Sulphur [Sulfur] Anaphylaxis    HPI   80F with HTN, HLD, DM, prior bleeding ulcers who is admitted to the hospital after failed treatment for pneumonia. Cardiology is consulted for new onset atrial fibrillation in the setting of spiking fevers.   Wendy Vasquez was admitted to the general medical service on 10/5 after failing a recent treatment course for pneumonia. She was initially hospitalized in late September for PNA; she was treated with IV antibiotics which were converted to oral augmentin on discharge. The patient was brought back to the ED on 04/23/14 by her daughter due to failure to improve. On admission,  antibiotics intensified to include vancomycin, zosyn, and azithromycin.   Around 6pm on 04/24/14, the patient spiked a fever to 101.9 and converted to rapid AF at 148bpm. Blood pressures were decreased to just below 789 systolic and so IV amiodarone was administered instead of a notal agent.   Of note, patients mental status has been poor during this hospitalization. This is apparently different than usual, according to chart review. After I saw the patient, the primary team ordered a CT angiogram to look for PE. It did not demonstrated a PE but did demonstrate a "moderate sized pericardial effusion." Given low BPs, I performed a bedside ultrasound assessment which was limited by poor patient cooperation including tensing of abdominal muscles during subcostal views. The limited study demonstrated grossly normal LVEF with a mild to moderate (depending on the location) effusion. The IVC collapsed > 50% with inspiration.   Inpatient Medications  . acidophilus  1 capsule Oral Daily  . ALPRAZolam  0.125 mg Oral QHS  . aspirin EC  81 mg Oral Daily  . [START ON 04/25/2014] azithromycin  250 mg Oral Daily  . calcium-vitamin D  1 tablet Oral BID WC  . docusate sodium  200 mg Oral QHS  . gemfibrozil  600 mg Oral BID  . heparin  5,000 Units Subcutaneous 3 times per day  . insulin aspart  0-9 Units Subcutaneous TID WC  . metoCLOPramide  10 mg Oral Q breakfast  . mometasone-formoterol  2 puff Inhalation BID  . multivitamin with minerals  1 tablet Oral Daily  . pantoprazole  40 mg Oral Daily  . piperacillin-tazobactam (ZOSYN)  IV  3.375 g Intravenous 3 times per day  . vancomycin  750 mg Intravenous Q24H  . vitamin B-12  1,000 mcg Oral Q breakfast    Family History Family History  Problem Relation Age of Onset  . Colon cancer Mother      Social History History   Social History  . Marital Status: Widowed    Spouse Name: N/A    Number of Children: N/A  . Years of Education: N/A   Occupational  History  . Not on file.   Social History Main Topics  . Smoking status: Former Smoker    Types: Cigarettes  . Smokeless tobacco: Never Used     Comment: 04/24/2014 "quit smoking in the 1970's or before; didn't smoke much; don't know for how long"  . Alcohol Use: No  . Drug Use: No  . Sexual Activity: No   Other Topics Concern  . Not on file   Social History Narrative  . No narrative on file     Review of Systems  Unable to obtain  Physical Exam  Blood pressure 98/60, pulse 151, temperature 98 F (36.7 C), temperature source Oral, resp. rate 18, height $RemoveBe'5\' 5"'muBziCxtD$  (1.651 m), weight 56.4 kg (124 lb 5.4 oz), SpO2 96.00%.  General: Elderly, frail, mildly uncomfortable appearing Psych: Altered but generally pleasant Neuro: Alert and oriented X 3. Moves all extremities spontaneously. HEENT: Dry tongue Neck: Supple without bruits or JVD. Lungs:  Resp regular and unlabored, CTA. Heart: Tachycardic, irregularly irregular Abdomen: Soft, non-tender, non-distended, BS + x 4.  Extremities: No clubbing, cyanosis or edema. DP/PT/Radials 2+ and equal bilaterally.  Labs  No results found for this basename: CKTOTAL, CKMB, TROPONINI,  in the last 72 hours Lab Results  Component Value Date   WBC 9.1 04/24/2014   HGB 9.9* 04/24/2014   HCT 30.7* 04/24/2014   MCV 96.8 04/24/2014   PLT 332 04/24/2014    Recent Labs Lab 04/23/14 2047 04/24/14 0545  NA 135*  137 139  K 4.1  4.0 4.1  CL 99  104 106  CO2 23 20  BUN $Re'13  12 11  'dMd$ CREATININE 0.81  0.90 0.68  CALCIUM 10.2 9.6  PROT 7.5  --   BILITOT 0.4  --   ALKPHOS 113  --   ALT 6  --   AST 20  --   GLUCOSE 176*  179* 116*   No results found for this basename: CHOL, HDL, LDLCALC, TRIG   No results found for this basename: DDIMER    Radiology/Studies  Dg Chest 1 View  04/11/2014   CLINICAL DATA:  Cough, weakness, confusion  EXAM: CHEST - 1 VIEW  COMPARISON:  04/06/2014  FINDINGS: Cardiomediastinal silhouette is stable. No pulmonary  edema. Left basilar atelectasis or infiltrate. Stable sclerotic changes proximal right humerus.  IMPRESSION: Left basilar atelectasis or infiltrate.  No pulmonary edema.   Electronically Signed   By: Wendy Vasquez M.D.   On: 04/11/2014 09:42   Dg Chest 2 View  04/24/2014   CLINICAL DATA:  Patient with hospital acquired pneumonia  EXAM: CHEST  2 VIEW  COMPARISON:  Chest radiograph 04/23/2014  FINDINGS: Low lung volumes. Stable cardiac and mediastinal contours. Heterogeneous opacities right mid and lower  lung. Minimal heterogeneous opacities left lung base. Small bilateral pleural effusions. High-riding right humeral head. Sclerotic lesion proximal right humerus may represent enchondroma are bone infarct. Thoracic spine degenerative change.  IMPRESSION: Low lung volumes.  Right mid and lower lung as well as left lower lung heterogeneous opacities which may represent scattered atelectasis or infection.   Electronically Signed   By: Annia Belt M.D.   On: 04/24/2014 09:11   Dg Chest 2 View  04/12/2014   ADDENDUM REPORT: 04/12/2014 14:05  ADDENDUM: The impression should read stable mild left base subsegmental atelectasis and/or infiltrate. Please ignore the word 'could' .   Electronically Signed   By: Maisie Fus  Register   On: 04/12/2014 14:05   04/12/2014   CLINICAL DATA:  Pneumonia.  EXAM: CHEST  2 VIEW  COMPARISON:  04/11/2014.  FINDINGS: Mediastinum and hilar structures are normal. Heart size normal. Persistent mild left basilar atelectasis and or infiltrate. Lungs otherwise clear. No pneumothorax. 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 Degenerative changes both shoulders and thoracic spine.  IMPRESSION: Stable mild left base subsegmental atelectasis and or infiltrate could  Electronically Signed: ByMaisie Fus  Register On: 04/12/2014 07:47   Dg Chest 2 View  04/06/2014   CLINICAL DATA:  Weakness  EXAM: CHEST  2 VIEW  COMPARISON:  November 09, 2013  FINDINGS: There is no edema or  consolidation. Heart size and pulmonary vascularity are normal. No adenopathy. There is degenerative change in the thoracic spine. There is a presumed bone infarct in the proximal right humerus, stable. There is evidence of superior migration of the right humeral head, suggesting chronic rotator cuff tear.  IMPRESSION: No edema or consolidation. Apparent bone infarct right proximal humerus. Evidence of chronic rotator cuff tear in the right shoulder. Bones are osteoporotic.   Electronically Signed   By: Bretta Bang M.D.   On: 04/06/2014 09:26   Ct Head Wo Contrast  04/23/2014   CLINICAL DATA:  Generalized weakness, acute onset. Initial encounter.  EXAM: CT HEAD WITHOUT CONTRAST  TECHNIQUE: Contiguous axial images were obtained from the base of the skull through the vertex without intravenous contrast.  COMPARISON:  None.  FINDINGS: There is no evidence of acute infarction, or intra- or extra-axial hemorrhage on CT.  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 Prominence of the ventricles and sulci reflects mild to moderate cortical volume loss. Mild cerebellar atrophy is noted. Scattered periventricular white matter change likely reflects small vessel ischemic microangiopathy.  The brainstem and fourth ventricle are within normal limits. The basal ganglia are unremarkable in appearance. The cerebral hemispheres demonstrate grossly normal gray-white differentiation. No mass effect or midline shift is seen.  There is no evidence of fracture; visualized osseous structures are unremarkable in appearance. The visualized portions of the orbits are within normal limits. The paranasal sinuses and mastoid air cells are well-aerated. No significant soft tissue abnormalities are seen.  IMPRESSION: 1. No acute intracranial pathology seen on CT. 2. 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 Sudden death due to a colloid cyst at this age is rare, but would  usually present with headaches and/or vomiting. 3. Mild to moderate cortical volume loss and scattered small vessel ischemic microangiopathy.   Electronically Signed   By: Roanna Raider M.D.   On: 04/23/2014 22:17   Dg Chest Port 1 View  04/23/2014   CLINICAL DATA:  Code sepsis. Acute onset of diffuse  chest pain, shortness of breath and weakness. Initial encounter.  EXAM: PORTABLE CHEST - 1 VIEW  COMPARISON:  Chest radiograph performed 04/12/2014  FINDINGS: The lungs are well-aerated and clear. There is no evidence of focal opacification, pleural effusion or pneumothorax.  The cardiomediastinal silhouette is borderline enlarged. No acute osseous abnormalities are seen. Sclerotic change within the right humeral head and neck likely reflects a remote bone infarct.  IMPRESSION: No acute cardiopulmonary process seen. Borderline cardiomegaly noted.   Electronically Signed   By: Garald Balding M.D.   On: 04/23/2014 21:30   TTE (11/10/13) Left ventricle: The cavity size was normal. Wall thickness was normal. Systolic function was normal. The estimated ejection fraction was in the range of 55% to 60%. Wall motion was normal; there were no regional wall motion abnormalities. There was an increased relative contribution of atrial contraction to ventricular filling. - Aortic valve: Trivial regurgitation.  ECG 04/23/14: ST to 120s. NSTTTWC. 04/24/14: Rapid AF. HR 170s. NSSTTWC. Very subtle ST depression in inferior and lateral leads  ASSESSMENT AND PLAN 63F with HTN, HLD, DM, prior bleeding ulcers who is admitted to the hospital after failed treatment for pneumonia. Cardiology is consulted for new onset atrial fibrillation in the setting of spiking fevers. She has hypotension in the setting of AF, hypovolemia, and sepsis. Hypotension may be exacerbated by her pericardial effusion but she does not have tamponade. Control of her AF will largely center on treating the underlying causes with volume resuscitation,  antipyretics, antibiotics. She has a CHADSVASC = 4 but is at risk for bleeding given history of GI hemorrhage. Would hold off on systemic anticoagulation at this time, particularly given acute illness with unclear trajectory and pericardial effusion. Suspect pericardial effusion is related to inflammation in the setting of the current infectious process. An inflamed pericardium could be driving AF although the ECG and history are not suggestive of frank pericarditis.   1. Continue amiodarone per protocol  2. Replete K to >4, and Mg to > 2 3. TTE in AM 4. Antipyretics 5. Continue mantainance fluids and would bolus additional 500cc boluses over the course of the night so long as respiratory status remains satisfactory.  6. Check ESR and CRP  Signed, Stevey Stapleton, MD 04/24/2014, 10:38 PM   Addendum AF rates remain very poorly controlled despite above measures. May need to consider DCCV although I worry that her chances of staying in rhythm may be low during the acute illness.

## 2014-04-24 NOTE — Progress Notes (Signed)
Pt arrived to unit alert and oriented to self and grunting. Oriented to room, unit, and staff. Pt bottom reddened but blanchable, foam placed. Rash noted to genitalia.  Bed in lowest position and call bell is within reach. Will continue to monitor.

## 2014-04-24 NOTE — Progress Notes (Signed)
At 1800 Pt converted to A.fib with RVR with HR in 150s. 5 mg metoprolol was given Pt also had temp of 101.9 and tylenol was given. After shift change RN rechecked pt HR and still in 150s. MD paged. Ordered 5 mg. Rapid response also notified.  Recheck BP prior to giving metoprolol and was 97/55 with HR 158. MD at bedside. MD instructed to hold metoprolol. MD assessed pt and decided to transfer pt to stepdown. RN checked rectal temp and was 103.1, HR 151 and BP 98/60. Family was at bedside and is aware of transfer.

## 2014-04-24 NOTE — Consult Note (Signed)
ANTIBIOTIC CONSULT NOTE - Follow Up Pharmacy Consult for Vancomycin Indication: r/o sepsis  Allergies  Allergen Reactions  . Ciprofloxacin Hives  . Sulphur [Sulfur] Anaphylaxis    Patient Measurements: Height: 5\' 5"  (165.1 cm) Weight: 124 lb 5.4 oz (56.4 kg) IBW/kg (Calculated) : 57  Vital Signs: Temp: 100.2 F (37.9 C) (10/06 1914) Temp Source: Oral (10/06 1914) BP: 97/55 mmHg (10/06 2020) Pulse Rate: 158 (10/06 2020) Intake/Output from previous day: 10/05 0701 - 10/06 0700 In: 1751 [I.V.:1751] Out: 203 [Urine:203] Intake/Output from this shift:    Labs:  Recent Labs  04/23/14 2047 04/24/14 0545  WBC 13.5* 9.1  HGB 12.0  13.9 9.9*  PLT 383 332  CREATININE 0.81  0.90 0.68   Estimated Creatinine Clearance: 43.3 ml/min (by C-G formula based on Cr of 0.68).  Microbiology: Recent Results (from the past 720 hour(s))  CULTURE, BLOOD (ROUTINE X 2)     Status: None   Collection Time    04/11/14  2:10 PM      Result Value Ref Range Status   Specimen Description BLOOD LEFT HAND   Final   Special Requests BOTTLES DRAWN AEROBIC AND ANAEROBIC 5CC   Final   Culture  Setup Time     Final   Value: 04/11/2014 22:36     Performed at [facility]   Culture     Final   Value: NO GROWTH 5 DAYS     Performed at [facility]   Report Status 04/17/2014 FINAL   Final  CULTURE, BLOOD (ROUTINE X 2)     Status: None   Collection Time    04/11/14  2:20 PM      Result Value Ref Range Status   Specimen Description BLOOD RIGHT HAND   Final   Special Requests BOTTLES DRAWN AEROBIC ONLY 8CC   Final   Culture  Setup Time     Final   Value: 04/11/2014 22:35     Performed at [facility]   Culture     Final   Value: NO GROWTH 5 DAYS     Performed at [facility]   Report Status 04/17/2014 FINAL   Final    Medical History: Past Medical History  Diagnosis Date  . Hypertension   . HLD (hyperlipidemia)   . Bleeding ulcer     requiring transfusions   . H/O hiatal hernia   . History of blood transfusion     "related to bleeding ulcers"  . Asthma     "a touch"  . Pneumonia X 1  . Diabetes mellitus without complication     "diet controlled"  . GERD (gastroesophageal reflux disease)   . Migraine     hx  . Daily headache     "last couple days" (04/24/2014)  . Arthritis     "all over"   Assessment: 89[facility] admission for CAP that failed outpatient doxycycline returns to the ED with fever and gradual decline.   Vancomycin was discontinued this morning and the patient was started on Azithromycin for atypical coverage.  Pt is also being screened for respiratory viruses.  This evening, pt continued to have fever 101.9 and tachycardia.  Vancomycin was restarted.  Goal of Therapy:  Vancomycin trough level 15-20 mcg/ml  Plan:  1) Vancomycin 750mg  IV q24 - next dose due 2200. 2) Continue Zosyn 3.375g IV q8 (4 hour infusion) 3) Follow renal function, cultures, LOT, level if needed  Toys 'R' UsKimberly Blen Ransome,  Pharm.D., BCPS Clinical Pharmacist Pager [phone] 04/24/2014 8:47 PM

## 2014-04-24 NOTE — Significant Event (Addendum)
Rapid Response Event Note Called per bedside RN at 2030 for pt with elevated HR and fever. Pt originally admitted for PNA. Resident paged , lopressor ordered and MD en route.  Overview: Time Called: 2030 Arrival Time: 2040 Event Type: Cardiac  Initial Focused Assessment: Upon my arrival pt found resting in bd. Family and Resident team at bedside. Pt awake alert, occasional moaning. Pt follows simple commands. Lungs clear, diminished at bases. Po2 96 on 2 LNC. HR rapid and irregular. Per monitor 140-150 afib. BP soft, sbp 80-90s. Family discussing with residents plan to tranfer tonight to SD for closer monitoring.   Interventions: Rectal temp obtained, yielding 103.1. Lopressor originally ordered but per RN Resident said do not give due to pt low BP. Resident Dr.E Moding paged. MD to order ABX, blood culture, IVF bolus. MD to reassess after fluids, reluctant to start cardizem for HR due to BP. Cardiology consulted. Family updated on plan of care tonight. Emotional support provided. Pt transferred to 6 C Step down unit. SD RN advised page resident after fluids complete if no improvement.       White, Wendy Vasquez

## 2014-04-24 NOTE — Progress Notes (Signed)
MD Ahmed notified of patients HR >150 and sustaining, temp of 101.9. MD also notified of patient's complaints of SOB despite 02 sats 99% on RA. Patient placed on 2L [place] per patient/daughter request. Orders received for stat EKG. Will continue to monitor.

## 2014-04-24 NOTE — Progress Notes (Addendum)
Internal Medicine Night Float Interim Progress Note  S: Day team called by nursing at 1800 for HR>150 and temp of 101.9.  EKG showed atrial fibrillation.  Gave metoprolol 5 mg IV x2 without improvement of HR.  Went to see patient with Dr. Delane Ginger to evaluate at request of day team.  Family in room and says mental status is stable.  She is able to answer yes and no questions and denies pain, chest discomfort, or palpitations.  Oriented to name and location per nursing staff.  Appears drowsy and having difficulty coughing, family concerned about elevated heart rate.  O: Filed Vitals:   04/24/14 2100  BP: 98/60  Pulse: 151  Temp: 103 (rectal)  Resp: 18  Sat: 99% on 2L   Physical Exam  Constitutional: She appears distressed (Trouble coughing, appear somnolent.  Family says at baseline.).  Cardiovascular:  Tachycardic, irregularly irregular.  Pulmonary/Chest: Effort normal. No respiratory distress. She has no wheezes. She has no rales.  Mild rhonchi bilaterally.  Musculoskeletal: She exhibits no edema and no tenderness.  Skin: Skin is warm and dry. No rash noted. She is not diaphoretic. No erythema.    A/P: Patient in atrial fibrillation with RVR and now with rising temperature.  WBC had been trending down this morning, but vancomycin was stopped today and she was switched to azithromycin for atypical coverage.  It is possible that antibiotics have not had enough time to work yet, but concern for HCAP given recent hospitalization.  MRSA coverage warranted at this time.  U/A was negative on admission with culture pending.  Blood cultures drawn on admission NGTD.  No leg swelling, and patient on heparin for DVT prophylaxis.  Not hypoxic on room air, oxygen just added by nursing for comfort.  However, will continue to consider PE if she does not improve with control of heart rate and broadened antibiotic coverage.  RVR likely secondary to infection, but will check labs to rule out thyroid, electrolyte  abnormalities.  Blood pressure low, so would rather not use diltiazem.  Talked to cardiology, and they think amiodarone drip would be best at this time.  Discussed with pharmacy and will not bolus amio due to concern for blood pressure lowering.  She has been receiving NS at 100 ml/hr, but could benefit from more fluids possible.  No cardiac history or kidney disease. -Transfer to step down for closer monitoring. -Repeat blood cultures. -Restart vancomycin per pharmacy consult. -500 ml NS bolus then continue at 100 ml/hr. -Amiodarone gtt. -Check TSH, CMP, Mg, troponin, d-dimer, procalcitonin. -Consulted cardiology who will see patient, appreciate their recs.  Wendy Dago, MD, PhD Internal Medicine Intern Pager: [phone] 04/24/2014,9:12 PM   --Addendum-- Wendy Dago, MD, PhD Internal Medicine Intern Pager: [phone] 04/24/2014,11:26 PM  D-dimer elevated at 2.19 (cutoff 0.88 with age).  Given advanced age, will rule out PE with CTA.  Revisited, blood pressure improved to 120s/60s after receiving 500 ml bolus and mental status slightly improved.  Cardiology seen and recommended additional 250-500 ml boluses throughout the night to maintain blood pressure and help lower rate.  Also recommended checking electrolytes as we had done and repleting Mg and K as needed.  K 4.1, Mg 1.8. -250 ml NS bolus, repeat as needed up to 1.5-2L max. -Magnesium sulfate 2 g IV, recheck Mg in the morning. -CTA to rule out PE.  --Addendum-- Wendy Dago, MD, PhD Internal Medicine Intern Pager: [phone] 04/25/2014,1:58 AM  CTA showed no evidence of PE, but moderate pericardial effusion with bilateral  pleural effusions and no clear pneumonia.  Concern for progression to tamponade, but no JVP on exam currently.  Procalcitonin 0.15, suggesting bacterial pneumonia unlikely.  TSH normal and troponin negative.  HR in 170s with BP 90/63.  Effusion most likely secondary to viral infection given fevers.  Talked to  Dr. Craige CottaSood of PCCM who wanted cardiology to re-evaluate.  Spoke with Dr. Zachery ConchFriedman of cardiology who plans to see the patient again and evaluate for tamponade. -Dr. Zachery ConchFriedman of cardiology to re-evaluate patient. -STAT echocardiogram. -500 ml NS bolus. -Consider stopping antibiotics in the morning.  --Addendum-- Wendy DagoEverett Remee Charley, MD, PhD Internal Medicine Intern Pager: [phone] 04/25/2014,2:23 AM  Per rapid response nurse Si GaulBrooke White, cardiology stopped by and performed bedside echo that showed a small pericardial effusion unlikely to be causing lower blood pressure and refractory RVR.  He recommended additional fluids.  RR nurse called and discussed with Dr. Craige CottaSood of PCCM who agreed to transfer patient to ICU for closer monitoring. -Transfer patient to ICU. -PCCM to evaluate and take over care.

## 2014-04-24 NOTE — Progress Notes (Signed)
Subjective: Patient is not able to communicate. At baseline she is interactive per daughter but she is not able to answer questions very clearly to [place]. She is moaning during the interview.  daughter states she had fever of 100.4 and cough at home. It worsened in last 1 week. She denies any pain or any other complaints.  Objective: Vital signs in last 24 hours: Filed Vitals:   04/24/14 0556 04/24/14 0757 04/24/14 1000 04/24/14 1408  BP: 111/54  118/64 110/62  Pulse: 81  86 80  Temp: 99.3 F (37.4 C)  98.2 F (36.8 C) 98.1 F (36.7 C)  TempSrc: Oral  Oral Oral  Resp: 22  20 18   Height:      Weight:      SpO2: 98% 98% 95% 96%   Weight change:   Intake/Output Summary (Last 24 hours) at 04/24/14 1542 Last data filed at 04/24/14 1409  Gross per 24 hour  Intake   2231 ml  Output    204 ml  Net   2027 ml  Vitals reviewed. General: resting in bed, NAD HEENT: PERRL, EOMI, no scleral icterus Cardiac: RRR, no rubs, murmurs or gallops Pulm: mild crackles on bilateral bases, R>L. Breathing without accessory muscles.  Abd: soft, nontender, nondistended, BS present Ext: warm and well perfused, no pedal edema. Very rigid on the arms and legs. Doesn't move when asked. Neuro: alert and oriented to person and place. Not able to do full exam due to patient not following all commands.   Lab Results: Basic Metabolic Panel:  Recent Labs Lab 04/23/14 2047 04/24/14 0545  NA 135*  137 139  K 4.1  4.0 4.1  CL 99  104 106  CO2 23 20  GLUCOSE 176*  179* 116*  BUN 13  12 11   CREATININE 0.81  0.90 0.68  CALCIUM 10.2 9.6   Liver Function Tests:  Recent Labs Lab 04/23/14 2047  AST 20  ALT 6  ALKPHOS 113  BILITOT 0.4  PROT 7.5  ALBUMIN 3.1*   No results found for this basename: LIPASE, AMYLASE,  in the last 168 hours No results found for this basename: AMMONIA,  in the last 168 hours CBC:  Recent Labs Lab 04/23/14 2047 04/24/14 0545  WBC 13.5* 9.1  NEUTROABS 11.6*   --   HGB 12.0  13.9 9.9*  HCT 36.5  41.0 30.7*  MCV 94.1 96.8  PLT 383 332   Cardiac Enzymes: No results found for this basename: CKTOTAL, CKMB, CKMBINDEX, TROPONINI,  in the last 168 hours BNP:  Recent Labs Lab 04/23/14 2048  PROBNP 403.4   D-Dimer: No results found for this basename: DDIMER,  in the last 168 hours CBG:  Recent Labs Lab 04/23/14 2349 04/24/14 0744 04/24/14 1212  GLUCAP 134* 131* 112*   Hemoglobin A1C: No results found for this basename: HGBA1C,  in the last 168 hours Fasting Lipid Panel: No results found for this basename: CHOL, HDL, LDLCALC, TRIG, CHOLHDL, LDLDIRECT,  in the last 168 hours Thyroid Function Tests: No results found for this basename: TSH, T4TOTAL, FREET4, T3FREE, THYROIDAB,  in the last 168 hours Coagulation:  Recent Labs Lab 04/23/14 2047  LABPROT 15.4*  INR 1.22   Anemia Panel: No results found for this basename: VITAMINB12, FOLATE, FERRITIN, TIBC, IRON, RETICCTPCT,  in the last 168 hours Urine Drug Screen: Drugs of Abuse  No results found for this basename: labopia,  cocainscrnur,  labbenz,  amphetmu,  thcu,  labbarb    Alcohol Level:  No results found for this basename: ETH,  in the last 168 hours Urinalysis:  Recent Labs Lab 04/23/14 2053  COLORURINE YELLOW  LABSPEC 1.022  PHURINE 5.5  GLUCOSEU NEGATIVE  HGBUR TRACE*  BILIRUBINUR NEGATIVE  KETONESUR NEGATIVE  PROTEINUR NEGATIVE  UROBILINOGEN 0.2  NITRITE NEGATIVE  LEUKOCYTESUR NEGATIVE   Misc. Labs:  Micro Results: No results found for this or any previous visit (from the past 240 hour(s)). Studies/Results: Dg Chest 2 View  04/24/2014   CLINICAL DATA:  Patient with hospital acquired pneumonia  EXAM: CHEST  2 VIEW  COMPARISON:  Chest radiograph 04/23/2014  FINDINGS: Low lung volumes. Stable cardiac and mediastinal contours. Heterogeneous opacities right mid and lower lung. Minimal heterogeneous opacities left lung base. Small bilateral pleural effusions.  High-riding right humeral head. Sclerotic lesion proximal right humerus may represent enchondroma are bone infarct. Thoracic spine degenerative change.  IMPRESSION: Low lung volumes.  Right mid and lower lung as well as left lower lung heterogeneous opacities which may represent scattered atelectasis or infection.   Electronically Signed   By: Annia Belt M.D.   On: 04/24/2014 09:11   Ct Head Wo Contrast  04/23/2014   CLINICAL DATA:  Generalized weakness, acute onset. Initial encounter.  EXAM: CT HEAD WITHOUT CONTRAST  TECHNIQUE: Contiguous axial images were obtained from the base of the skull through the vertex without intravenous contrast.  COMPARISON:  None.  FINDINGS: There is no evidence of acute infarction, or intra- or extra-axial hemorrhage on CT.  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 Prominence of the ventricles and sulci reflects mild to moderate cortical volume loss. Mild cerebellar atrophy is noted. Scattered periventricular white matter change likely reflects small vessel ischemic microangiopathy.  The brainstem and fourth ventricle are within normal limits. The basal ganglia are unremarkable in appearance. The cerebral hemispheres demonstrate grossly normal gray-white differentiation. No mass effect or midline shift is seen.  There is no evidence of fracture; visualized osseous structures are unremarkable in appearance. The visualized portions of the orbits are within normal limits. The paranasal sinuses and mastoid air cells are well-aerated. No significant soft tissue abnormalities are seen.  IMPRESSION: 1. No acute intracranial pathology seen on CT. 2. 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 Sudden death due to a colloid cyst at this age is rare, but would usually present with headaches and/or vomiting. 3. Mild to moderate cortical volume loss and scattered small vessel ischemic microangiopathy.   Electronically Signed    By: Roanna Raider M.D.   On: 04/23/2014 22:17   Dg Chest Port 1 View  04/23/2014   CLINICAL DATA:  Code sepsis. Acute onset of diffuse chest pain, shortness of breath and weakness. Initial encounter.  EXAM: PORTABLE CHEST - 1 VIEW  COMPARISON:  Chest radiograph performed 04/12/2014  FINDINGS: The lungs are well-aerated and clear. There is no evidence of focal opacification, pleural effusion or pneumothorax.  The cardiomediastinal silhouette is borderline enlarged. No acute osseous abnormalities are seen. Sclerotic change within the right humeral head and neck likely reflects a remote bone infarct.  IMPRESSION: No acute cardiopulmonary process seen. Borderline cardiomegaly noted.   Electronically Signed   By: Roanna Raider M.D.   On: 04/23/2014 21:30   Medications: I have reviewed the patient's current medications. Scheduled Meds: . acidophilus  1 capsule Oral Daily  . ALPRAZolam  0.125 mg Oral QHS  . aspirin EC  81 mg Oral Daily  . [  START ON 04/25/2014] azithromycin  250 mg Oral Daily  . calcium-vitamin D  1 tablet Oral BID WC  . docusate sodium  200 mg Oral QHS  . gemfibrozil  600 mg Oral BID  . heparin  5,000 Units Subcutaneous 3 times per day  . insulin aspart  0-9 Units Subcutaneous TID WC  . metoCLOPramide  10 mg Oral Q breakfast  . mometasone-formoterol  2 puff Inhalation BID  . multivitamin with minerals  1 tablet Oral Daily  . pantoprazole  40 mg Oral Daily  . piperacillin-tazobactam (ZOSYN)  IV  3.375 g Intravenous 3 times per day  . vitamin B-12  1,000 mcg Oral Q breakfast   Continuous Infusions: . sodium chloride 100 mL/hr at 04/24/14 0025   PRN Meds:.acetaminophen, albuterol, guaiFENesin, HYDROcodone-homatropine, naphazoline, polyethylene glycol Assessment/Plan: Principal Problem:   HCAP (healthcare-associated pneumonia) Active Problems:   HTN (hypertension)   HLD (hyperlipidemia)   Asthma   Sepsis  80 yo female with HTN, asthma, HLD, recently treated for CAP but  failed treatment here with HCAP vs viral bronchitis.  HCAP -  failed outpatient doxy, failed CAP treatment previously at the hospital with augmentin. Had fever at home, has cough and leukocytosis on presentation. CXR was initially clear but with IVF the CXR showing some RUL and RML, and LLL opacities. WBC normalized. Afebrile currently.  Unclear if it's bacterial vs viral.  - was on vanc and zosyn IV. Will switch to azithromycin and zosyn to cover for atypicals. - pending urine strep and legionella. Will get resp virus panel, also mycoplasma ab. - f/up cultures: sputum, bcx  Acute toxic encephalopathy- likely 2/2 to infection.  -Will treat infection for now and see if she improves.  Asthma - continue dulera and albuterol inhaler.  HTN - not on meds at home. Well controlled here without meds.  Anxiety -cont xanax. HLD- gemfibrozil   DVT ppx: sq heparin  Dispo: Disposition is deferred at this time, awaiting improvement of current medical problems.  Anticipated discharge in approximately 2-3 day(s).   The patient does have a current PCP Barbie Banner(Fred H Wilson, MD) and [facility].  The patient does have transportation limitations that hinder transportation to clinic appointments.  .Services Needed at time of discharge: Y = Yes, Blank = No PT:   OT:   RN:   Equipment:   Other:     LOS: 1 day   Hyacinth Meekerasrif Endya Austin, MD 04/24/2014, 3:42 PM

## 2014-04-24 NOTE — Clinical Social Work Placement (Deleted)
Clinical Social Work Department CLINICAL SOCIAL WORK PLACEMENT NOTE 04/24/2014  Patient:  Wendy Vasquez,Wendy Vasquez  Account Number:  [id number] Admit date:  03/19/2014  Clinical Social Worker:  Sherisa Gilvin BRYANT Kaimana Lurz, LCSWA  Date/time:  04/24/2014 04:00 PM  Clinical Social Work is seeking post-discharge placement for this patient at the following level of care:   SKILLED NURSING   (*CSW will update this form in Epic as items are completed)   04/24/2014  Patient/family provided with [facility] Department of Clinical Social Work's list of facilities offering this level of care within the geographic area requested by the patient (or if unable, by the patient's family).  04/24/2014  Patient/family informed of their freedom to choose among providers that offer the needed level of care, that participate in Medicare, Medicaid or managed care program needed by the patient, have an available bed and are willing to accept the patient.  04/24/2014  Patient/family informed of [facility]' ownership interest in [facility], as well as of the fact that they are under no obligation to receive care at this facility.  PASARR submitted to EDS on 04/24/2014 PASARR number received on   FL2 transmitted to all facilities in geographic area requested by pt/family on  04/24/2014 FL2 transmitted to all facilities within larger geographic area on 04/24/2014  Patient informed that his/her managed care company has contracts with or will negotiate with  certain facilities, including the following:     Patient/family informed of bed offers received:   Patient chooses bed at  Physician recommends and patient chooses bed at    Patient to be transferred to  on   Patient to be transferred to facility by  Patient and family notified of transfer on  Name of family member notified:    The following physician request were entered in Epic: Physician Request  Please sign FL2.    Additional Comments:  MD,  please sign FL2 and 30 day note in patient's chart for patient's PASRR number.    Bryant Amos Gaber MSW, LCSWA, LCASA, [phone] 

## 2014-04-24 NOTE — Progress Notes (Signed)
OT Cancellation Note  Patient Details Name: Wendy Vasquez MRN: [id number] DOB: 1923/11/22   Cancelled Treatment:    Reason Eval/Treat Not Completed: Patient not medically ready (Patient's orders read "Bed rest") Will re-attempt OT evaluation as able/appropriate.   Bret Stamour 04/24/2014, 10:19 AM

## 2014-04-24 NOTE — Evaluation (Addendum)
Physical Therapy Evaluation Patient Details Name: Wendy Vasquez MRN: [id number] DOB: 05-04-1924 Today's Date: 04/24/2014   History of Present Illness  Ms. Wendy Vasquez is an 80 y.o. female with with past medical history of hypertension, asthma, hyperlipidemia, and generalized deconditioning, gastric ulcer, who presents with cough, chest pain and fever  Clinical Impression  *Pt admitted with PNA*. Pt currently with functional limitations due to the deficits listed below (see PT Problem List).  Pt will benefit from skilled PT to increase their independence and safety with mobility to allow discharge to the venue listed below.   At baseline pt has BUE/BLE contractures and is non ambulatory and requires assist for pivot transfers. Per daughter, who is primary caregiver, pt is weaker now than at baseline. Total assist for stand pivot transfer and supine to sit required at present, per daughter pt used to be able to assist with transfers. Daughter agreeable to ST-SNF.   **    Follow Up Recommendations SNF    Equipment Recommendations  Hospital bed;Other (comment) (hoyer lift)    Recommendations for Other Services       Precautions / Restrictions Precautions Precautions: Fall Precaution Comments: total assist Restrictions Weight Bearing Restrictions: No      Mobility  Bed Mobility Overal bed mobility: Needs Assistance Bed Mobility: Supine to Sit;Rolling     Supine to sit: Total assist     General bed mobility comments: , total assist to pivot to EOB and sit   Transfers Overall transfer level: Needs assistance   Transfers: Stand Pivot Transfers   Stand pivot transfers: Total assist       General transfer comment: total assist to pivot bed to recliner  Ambulation/Gait             General Gait Details: non ambulatory at baseline  Stairs            Wheelchair Mobility    Modified Rankin (Stroke Patients Only)       Balance Overall balance assessment: Needs  assistance Sitting-balance support: Feet supported Sitting balance-Leahy Scale: Zero   Postural control: Posterior lean                                   Pertinent Vitals/Pain Pain Assessment: No/denies pain Pain Location: no pain at rest, chest sore when coughing    Home Living Family/patient expects to be discharged to:: Private residence Living Arrangements: Children Available Help at Discharge: Family;Available 24 hours/day Type of Home: House Home Access: Stairs to enter   [employer] of Steps: 3-4 Home Layout: One level Home Equipment: Wheelchair - manual;Transport chair;Bedside commode;Other (comment)      Prior Function Level of Independence: Needs assistance   Gait / Transfers Assistance Needed: pt is total assist to stand and pivot to [facility], Pt lays in lift chair all the time and is total assist for all care. Dgtr states she tries to do ROM and doesn 't have room in house for hospital bed or lift. Pt has not ambulated for 4 years  ADL's / Homemaking Assistance Needed: total assist        Hand Dominance        Extremity/Trunk Assessment     RUE Deficits / Details: pt with very stiff RUE able to perform shoulder ROM grossly 30degrees abduction and flexion with pt grimace, pt maintains hands clenched     LUE Deficits / Details: pt with very stiff RUE able  to perform shoulder ROM grossly 30degrees abduction and flexion with pt grimace, pt maintains hands clenched     RLE Deficits / Details: pt with bil knee flexion contracture grossly 10 degrees, foot drop and only 1/5 movement of bil LE with PROM for positioning  LLE Deficits / Details: pt with bil knee flexion contracture grossly 10 degrees, foot drop and only 1/5 movement of bil LE with PROM for positioning   Cervical / Trunk Assessment: Kyphotic;Other exceptions  Communication   Communication: Expressive difficulties (pt moans frequently, when she verbalizes she is difficult to  understand)  Cognition Arousal/Alertness: Awake/alert Behavior During Therapy: Flat affect Overall Cognitive Status: Within Functional Limits for tasks assessed                             Exercises  Hip abduction x 10 Bilaterally AAROM Heel slides x 10 Bilaterally AAROM Ankle pumps x 10 bilaterally AAROM All in supine      Assessment/Plan    PT Assessment Patient needs continued PT services  PT Diagnosis Generalized weakness   PT Problem List Decreased strength;Decreased range of motion;Decreased activity tolerance;Decreased balance;Decreased mobility  PT Treatment Interventions Functional mobility training;Therapeutic activities;Patient/family education;Balance training;Therapeutic exercise   PT Goals (Current goals can be found in the Care Plan section) Acute Rehab PT Goals Patient Stated Goal: to get strength back PT Goal Formulation: With family Time For Goal Achievement: 05/08/14 Potential to Achieve Goals: Fair    Frequency Min 3X/week   Barriers to discharge        Co-evaluation               End of Session Equipment Utilized During Treatment: Gait belt Activity Tolerance: Patient limited by fatigue Patient left: with call bell/phone within reach;in chair;with family/visitor present Nurse Communication: Mobility status         Time: [id number] PT Time Calculation (min): 24 min   Charges:   PT Evaluation $Initial PT Evaluation Tier I: 1 Procedure PT Treatments $Therapeutic Activity: 8-22 mins   PT G Codes:          Tamala SerUhlenberg, Jarika Robben Kistler 04/24/2014, 12:02 PM [phone]

## 2014-04-24 NOTE — Progress Notes (Signed)
Patient ID: Wendy Vasquez, female   DOB: June 13, 1924, 80 y.o.   MRN: [id number] Medicine attending admission note: I personally interviewed and thoroughly examined this patient together with the medical resident team doctors Mikey BussingHoffman and Ahmed. I reviewed pertinent previous records, lab data base, and radiographs. I concur with the assessment and management plan.  Clinical history: [age] year old woman recently hospitalized here from September 18 through September 25 for "community acquired pneumonia." She has done poorly since discharge with persistent, hacking. nonproductive cough and low-grade fevers. She lives with her daughter who was concerned that she was not improving and brought her back to the hospital for reevaluation. Initial antibiotic coverage in September with Cefipime plus vancomycin then changed to oral Augmentin. None of her x-rays including the admission film of 04/06/2014 and current x-rays shows any major infiltrate. There are some vague, questionable, early infiltrates in the right lung on this mornings post hydration film. She has had a mild, fluctuating, leukocytosis since the September admission with peak white count 12,500 on September 23, 10,500 Discharge on September 24, 13,500 with 86% neutrophils on October 5, the day of current admission, and 9100 this morning on repeat. She is lymphopenic with 6% lymphocytes on the differential. Daughter reports that she is very sharp mentally but is currently in so much distress that she is not very interactive. The admitting physician was concerned enough with her mental status to get a CT scan of the brain which does not show any gross pathology except for colloid cyst. On exam: Blood pressure 110/62, pulse 80, temperature 98.1 F (36.7 C), temperature source Oral, resp. rate 18, height 5\' 5"  (1.651 m), weight 124 lb 5.4 oz (56.4 kg), SpO2 96.00%. She Valsalva's to suppress herself from coughing and appears to be in distress when she does so.  She has done this so much, she has developed petechiae on both sides of her face. She does have fluent speech and is oriented to person and place. Pharynx with no erythema exudate ulcer or mass. No cervical, supraclavicular, or axillary adenopathy. There are some scattered rales at the right lung base. Lungs are resonant to percussion throughout. Regular cardiac rhythm without murmur Abdomen soft and nontender Extremities no edema, no calf tenderness Neurologic: Hard to assess. She is very rigid. She does not follow most commands. Pupils are small equal and round. She has her hands clasped tight. She will not move her arms or legs against gravity or resistance.  Impression: She has a persistent respiratory illness despite recent treatment with broad-spectrum antibiotics. My concern is that this is either an atypical bacterial organism such as mycoplasma or a viral illness. Plan: I would like to screen her for respiratory viruses. Stop the vancomycin and substitute with azithromycin to cover atypicals. Stop Zosyn if bacterial cultures negative after 72 hours. I would consider a CT scan of her chest to look for additional pathology if there is no clinical improvement over the next 24-48 hours. She is a former smoker. She is lymphopenic. Possibility of underlying malignancy must be considered in the differential diagnosis.  Bronchodilators and antitussives.  Cephas DarbyJames Yaman Grauberger, MD, FACP  Hematology-Oncology/Internal Medicine

## 2014-04-24 NOTE — Progress Notes (Signed)
Pt transferring to 6C04. Report given to Aggie Cosierheresa, [place]RN.

## 2014-04-24 NOTE — ED Provider Notes (Addendum)
Medical screening examination/treatment/procedure(s) were conducted as a shared visit with non-physician practitioner(s) and myself.  I personally evaluated the patient during the encounter.  Patient here with cough and congestion. Febrile on arrival. Pt dyspnic and tachycardic. Suspect pneumonia although CXr shows now infiltrates. petchiae notes from coughing. abx started and pt to be admitted  EKG Interpretation   Date/Time:  Monday April 23 2014 20:14:16 EDT Ventricular Rate:  126 PR Interval:  153 QRS Duration: 77 QT Interval:  300 QTC Calculation: 434 R Axis:   37 Text Interpretation:  Age not entered, assumed to be  80 years old for  purpose of ECG interpretation Sinus tachycardia Atrial premature complex  Nonspecific T abnormalities, diffuse leads Confirmed by Freida BusmanALLEN  MD, Daven Pinckney  ([id number]) on 04/23/2014 9:58:28 PM       Toy BakerAnthony T Sheralee Qazi, MD 04/24/14 1224  Toy BakerAnthony T Pebble Botkin, MD 05/11/14 1157

## 2014-04-25 ENCOUNTER — Encounter (HOSPITAL_COMMUNITY): Payer: Self-pay

## 2014-04-25 ENCOUNTER — Inpatient Hospital Stay (HOSPITAL_COMMUNITY): Payer: Medicare Other

## 2014-04-25 DIAGNOSIS — I319 Disease of pericardium, unspecified: Secondary | ICD-10-CM

## 2014-04-25 DIAGNOSIS — N179 Acute kidney failure, unspecified: Secondary | ICD-10-CM

## 2014-04-25 DIAGNOSIS — A419 Sepsis, unspecified organism: Principal | ICD-10-CM

## 2014-04-25 LAB — T4, FREE: Free T4: 1 ng/dL (ref 0.80–1.80)

## 2014-04-25 LAB — LEGIONELLA ANTIGEN, URINE

## 2014-04-25 LAB — BASIC METABOLIC PANEL
Anion gap: 12 (ref 5–15)
BUN: 11 mg/dL (ref 6–23)
CALCIUM: 9.2 mg/dL (ref 8.4–10.5)
CO2: 19 mEq/L (ref 19–32)
CREATININE: 0.72 mg/dL (ref 0.50–1.10)
Chloride: 106 mEq/L (ref 96–112)
GFR calc Af Amer: 86 mL/min — ABNORMAL LOW (ref >90)
GFR calc non Af Amer: 74 mL/min — ABNORMAL LOW (ref >90)
Glucose, Bld: 154 mg/dL — ABNORMAL HIGH (ref 70–99)
Potassium: 4.4 mEq/L (ref 3.7–5.3)
Sodium: 137 mEq/L (ref 137–147)

## 2014-04-25 LAB — CBC WITH DIFFERENTIAL/PLATELET
BASOS PCT: 0 % (ref 0–1)
Basophils Absolute: 0 10*3/uL (ref 0.0–0.1)
EOS ABS: 0 10*3/uL (ref 0.0–0.7)
Eosinophils Relative: 0 % (ref 0–5)
HCT: 27.5 % — ABNORMAL LOW (ref 36.0–46.0)
Hemoglobin: 9 g/dL — ABNORMAL LOW (ref 12.0–15.0)
Lymphocytes Relative: 14 % (ref 12–46)
Lymphs Abs: 1.5 10*3/uL (ref 0.7–4.0)
MCH: 30.4 pg (ref 26.0–34.0)
MCHC: 32.7 g/dL (ref 30.0–36.0)
MCV: 92.9 fL (ref 78.0–100.0)
Monocytes Absolute: 1.1 10*3/uL — ABNORMAL HIGH (ref 0.1–1.0)
Monocytes Relative: 11 % (ref 3–12)
Neutro Abs: 7.8 10*3/uL — ABNORMAL HIGH (ref 1.7–7.7)
Neutrophils Relative %: 75 % (ref 43–77)
PLATELETS: 283 10*3/uL (ref 150–400)
RBC: 2.96 MIL/uL — ABNORMAL LOW (ref 3.87–5.11)
RDW: 13.1 % (ref 11.5–15.5)
WBC: 10.4 10*3/uL (ref 4.0–10.5)

## 2014-04-25 LAB — GLUCOSE, CAPILLARY
GLUCOSE-CAPILLARY: 157 mg/dL — AB (ref 70–99)
GLUCOSE-CAPILLARY: 124 mg/dL — AB (ref 70–99)
Glucose-Capillary: 166 mg/dL — ABNORMAL HIGH (ref 70–99)
Glucose-Capillary: 93 mg/dL (ref 70–99)

## 2014-04-25 LAB — CBC
HEMATOCRIT: 30.8 % — AB (ref 36.0–46.0)
Hemoglobin: 9.9 g/dL — ABNORMAL LOW (ref 12.0–15.0)
MCH: 30.4 pg (ref 26.0–34.0)
MCHC: 32.1 g/dL (ref 30.0–36.0)
MCV: 94.5 fL (ref 78.0–100.0)
Platelets: 316 10*3/uL (ref 150–400)
RBC: 3.26 MIL/uL — AB (ref 3.87–5.11)
RDW: 13.1 % (ref 11.5–15.5)
WBC: 13.5 10*3/uL — ABNORMAL HIGH (ref 4.0–10.5)

## 2014-04-25 LAB — CK: Total CK: 30 U/L (ref 7–177)

## 2014-04-25 LAB — LACTIC ACID, PLASMA
LACTIC ACID, VENOUS: 2.7 mmol/L — AB (ref 0.5–2.2)
LACTIC ACID, VENOUS: 2.1 mmol/L (ref 0.5–2.2)
LACTIC ACID, VENOUS: 1.8 mmol/L (ref 0.5–2.2)

## 2014-04-25 LAB — URINE CULTURE
COLONY COUNT: NO GROWTH
Culture: NO GROWTH

## 2014-04-25 LAB — MAGNESIUM: Magnesium: 2 mg/dL (ref 1.5–2.5)

## 2014-04-25 LAB — MRSA PCR SCREENING: MRSA by PCR: NEGATIVE

## 2014-04-25 MED ORDER — CETYLPYRIDINIUM CHLORIDE 0.05 % MT LIQD
7.0000 mL | Freq: Two times a day (BID) | OROMUCOSAL | Status: DC
  Administered 2014-04-25 – 2014-05-01 (×11): 7 mL via OROMUCOSAL

## 2014-04-25 MED ORDER — SODIUM CHLORIDE 0.9 % IV BOLUS (SEPSIS)
500.0000 mL | Freq: Once | INTRAVENOUS | Status: AC
  Administered 2014-04-25: 02:00:00 500 mL via INTRAVENOUS

## 2014-04-25 MED ORDER — PHENYLEPHRINE HCL 10 MG/ML IJ SOLN
30.0000 ug/min | INTRAVENOUS | Status: DC
  Administered 2014-04-25: 30 ug/min via INTRAVENOUS
  Filled 2014-04-25: qty 1

## 2014-04-25 MED ORDER — DEXTROSE 5 % IV SOLN
250.0000 mg | Freq: Every day | INTRAVENOUS | Status: DC
  Administered 2014-04-25: 250 mg via INTRAVENOUS
  Filled 2014-04-25 (×2): qty 250

## 2014-04-25 MED ORDER — GEMFIBROZIL 600 MG PO TABS
600.0000 mg | ORAL_TABLET | Freq: Two times a day (BID) | ORAL | Status: DC
  Administered 2014-04-26 – 2014-04-27 (×3): 600 mg via ORAL
  Filled 2014-04-25 (×5): qty 1

## 2014-04-25 MED ORDER — METOPROLOL TARTRATE 1 MG/ML IV SOLN
5.0000 mg | Freq: Once | INTRAVENOUS | Status: AC
  Administered 2014-04-25: 5 mg via INTRAVENOUS
  Filled 2014-04-25: qty 5

## 2014-04-25 MED ORDER — SODIUM CHLORIDE 0.9 % IV BOLUS (SEPSIS)
500.0000 mL | Freq: Once | INTRAVENOUS | Status: AC
  Administered 2014-04-25: 500 mL via INTRAVENOUS

## 2014-04-25 MED ORDER — ARFORMOTEROL TARTRATE 15 MCG/2ML IN NEBU
15.0000 ug | INHALATION_SOLUTION | Freq: Two times a day (BID) | RESPIRATORY_TRACT | Status: DC
  Administered 2014-04-25 – 2014-05-01 (×11): 15 ug via RESPIRATORY_TRACT
  Filled 2014-04-25 (×16): qty 2

## 2014-04-25 MED ORDER — IOHEXOL 350 MG/ML SOLN
100.0000 mL | Freq: Once | INTRAVENOUS | Status: AC | PRN
  Administered 2014-04-25: 100 mL via INTRAVENOUS

## 2014-04-25 MED ORDER — BUDESONIDE 0.5 MG/2ML IN SUSP
0.5000 mg | Freq: Two times a day (BID) | RESPIRATORY_TRACT | Status: DC
  Administered 2014-04-25 – 2014-05-01 (×11): 0.5 mg via RESPIRATORY_TRACT
  Filled 2014-04-25 (×7): qty 2
  Filled 2014-04-25: qty 4
  Filled 2014-04-25 (×8): qty 2

## 2014-04-25 NOTE — Progress Notes (Signed)
04/25/14 1500  Clinical Encounter Type  Visited With Patient  Visit Type Initial;Spiritual support  Referral From Patient   Chaplain attempted to visit with patient. Chaplain was referred to patient via spiritual care consult. Chaplain greeted patient several times and knocked on the open door but patient appeared to be sleeping heavily. Chaplain will follow up with patient at a later time to fulfill her request for prayer. Cranston NeighborStrother, Jontay Maston R, Chaplain 3:45 PM

## 2014-04-25 NOTE — Evaluation (Signed)
Occupational Therapy Evaluation Patient Details Name: Wendy Vasquez MRN: [id number] DOB: 06/05/1924 Today's Date: 04/25/2014    History of Present Illness Wendy Vasquez is an 80 y.o. female with with past medical history of hypertension, asthma, hyperlipidemia, and generalized deconditioning, gastric ulcer, who presents with cough, chest pain and fever   Clinical Impression   Pt required total care for ADL and mobility prior to admission.  She displays multiple joint contractures. Family is not able to care for pt in the home.  Will need SNF.  No acute OT needs, will defer to SNF.    Follow Up Recommendations  SNF;Supervision/Assistance - 24 hour    Equipment Recommendations       Recommendations for Other Services       Precautions / Restrictions Precautions Precautions: Fall Precaution Comments: total assist Restrictions Weight Bearing Restrictions: No      Mobility Bed Mobility   Bed Mobility:  (total assist)              Transfers                 General transfer comment: total assist, recommend lift    Balance                                            ADL Overall ADL's : At baseline (total care)                                             Vision                     Perception     Praxis      Pertinent Vitals/Pain Pain Assessment: Faces Faces Pain Scale: Hurts little more Pain Location: UEs with ROM Pain Descriptors / Indicators: Other (Comment) (pt moans, grimaces)     Hand Dominance Right   Extremity/Trunk Assessment Upper Extremity Assessment Upper Extremity Assessment: RUE deficits/detail;LUE deficits/detail RUE Deficits / Details: pt with very stiff RUE able to perform shoulder ROM grossly 30degrees abduction and flexion with pt grimace, pt maintains hands clenched RUE Coordination: decreased fine motor;decreased gross motor LUE Deficits / Details: pt with very stiff RUE able to perform  shoulder ROM grossly 30degrees abduction and flexion with pt grimace, pt maintains hands clenched LUE Coordination: decreased fine motor;decreased gross motor   Lower Extremity Assessment Lower Extremity Assessment: Defer to PT evaluation       Communication Communication Communication: Expressive difficulties   Cognition Arousal/Alertness: Awake/alert Behavior During Therapy: Flat affect                       General Comments       Exercises       Shoulder Instructions      Home Living Family/patient expects to be discharged to:: Skilled nursing facility Living Arrangements: Children;Other relatives Available Help at Discharge: Family;Available 24 hours/day Type of Home: House Home Access: Stairs to enter [employer] of Steps: 3-4   Home Layout: One level               Home Equipment: Wheelchair - manual;Transport chair;Bedside commode;Other (comment)          Prior Functioning/Environment Level of Independence: Needs assistance  Gait /  Transfers Assistance Needed: pt is total assist [facility] stand and [facility], Pt lays in lift chair all the time and is total assist for all care. Dgtr states she tries to do ROM and doesn 't have room in house for hospital bed or lift. Pt has not ambulated for 4 years ADL's / Homemaking Assistance Needed: total assist        OT Diagnosis:     OT Problem List: Decreased range of motion;Impaired UE functional use;Pain;Decreased safety awareness;Decreased coordination   OT Treatment/Interventions:      OT Goals(Current goals can be found in the care plan section)    OT Frequency:     Barriers to D/C:            Co-evaluation              End of Session    Activity Tolerance: Patient tolerated treatment well Patient left: in bed;with call bell/phone within reach   Time: 1140-1156 OT Time Calculation (min): 16 min Charges:  OT General Charges $OT Visit: 1 Procedure OT Evaluation $Initial  OT Evaluation Tier I: 1 Procedure G-Codes:    Evern BioMayberry, Adarryl Goldammer Lynn 04/25/2014, 11:57 AM [phone]

## 2014-04-25 NOTE — Progress Notes (Signed)
Subjective:   89F with HTN, HLD, DM, prior bleeding ulcers, ? Parkinson's disease who is admitted to the hospital after failed treatment for pneumonia. Cardiology is consulted for new onset atrial fibrillation in the setting of spiking fevers.   CT 10/6 no PE. + ?infiltrate. Moderate pericardial effusion.   Started on amiodarone. Now back in NSR.   Awake. Moaning. Unable to provide any meaningful history.   Echo: Reviewed personally. LVEF grossly normal. There is moderate circumfrential effusion posterior > anterior. There is significant (>25%) respiratory variation in mitral inflow but this is in setting of AF with RVR. No RV collapse. IVC is dilated.    Intake/Output Summary (Last 24 hours) at 04/25/14 1010 Last data filed at 04/25/14 0900  Gross per 24 hour  Intake 5401.16 ml  Output    638 ml  Net 4763.16 ml    Current meds: . acidophilus  1 capsule Oral Daily  . antiseptic oral rinse  7 mL Mouth Rinse BID  . arformoterol  15 mcg Nebulization BID  . aspirin EC  81 mg Oral Daily  . azithromycin  250 mg Intravenous Daily  . budesonide  0.5 mg Nebulization BID  . calcium-vitamin D  1 tablet Oral BID WC  . docusate sodium  200 mg Oral QHS  . gemfibrozil  600 mg Oral BID  . heparin  5,000 Units Subcutaneous 3 times per day  . insulin aspart  0-9 Units Subcutaneous TID WC  . metoCLOPramide  10 mg Oral Q breakfast  . multivitamin with minerals  1 tablet Oral Daily  . pantoprazole  40 mg Oral Daily  . piperacillin-tazobactam (ZOSYN)  IV  3.375 g Intravenous 3 times per day  . vancomycin  750 mg Intravenous Q24H  . vitamin B-12  1,000 mcg Oral Q breakfast   Infusions: . sodium chloride 100 mL/hr at 04/25/14 0300  . amiodarone 30 mg/hr (04/25/14 0400)  . phenylephrine (NEO-SYNEPHRINE) Adult infusion Stopped (04/25/14 1610)     Objective:  Blood pressure 125/57, pulse 59, temperature 100.9 F (38.3 C), temperature source Core (Comment), resp. rate 27, height 5\' 7"  (1.702  m), weight 63.4 kg (139 lb 12.4 oz), SpO2 99.00%. Weight change: 6.7 kg (14 lb 12.4 oz)  Physical Exam: General:  Elderly. Chronically ill-appearing. Unable to provide history Moaning HEENT: normal Neck: stiff. JVP up. Unable to evaluste for Kussmauls  Carotids 2+ bilat; no bruits. No lymphadenopathy or thryomegaly appreciated. Cor: Tachy mildly irregular Lungs: mild rhonchi Abdomen: soft, nontender, nondistended. No hepatosplenomegaly. No bruits or masses. Good bowel sounds. Extremities: no cyanosis, clubbing, rash, edema Neuro: alert & orientedx3, cranial nerves grossly intact. moves all 4 extremities w/o difficulty. Affect pleasant  Telemetry: NSR-> AF->Sinus tach  Lab Results: Basic Metabolic Panel:  Recent Labs Lab 04/23/14 2047 04/24/14 0545 04/24/14 2206 04/25/14 0700  NA 135*  137 139 137 137  K 4.1  4.0 4.1 4.1 4.4  CL 99  104 106 103 106  CO2 23 20 22 19   GLUCOSE 176*  179* 116* 147* 154*  BUN 13  12 11 12 11   CREATININE 0.81  0.90 0.68 0.83 0.72  CALCIUM 10.2 9.6 9.2 9.2  MG  --   --  1.8 2.0   Liver Function Tests:  Recent Labs Lab 04/23/14 2047 04/24/14 2206  AST 20 15  ALT 6 6  ALKPHOS 113 81  BILITOT 0.4 0.3  PROT 7.5 6.4  ALBUMIN 3.1* 2.5*   No results found for this basename: LIPASE, AMYLASE,  in the last 168 hours No results found for this basename: AMMONIA,  in the last 168 hours CBC:  Recent Labs Lab 04/23/14 2047 04/24/14 0545 04/25/14 0700  WBC 13.5* 9.1 13.5*  NEUTROABS 11.6*  --   --   HGB 12.0  13.9 9.9* 9.9*  HCT 36.5  41.0 30.7* 30.8*  MCV 94.1 96.8 94.5  PLT 383 332 316   Cardiac Enzymes:  Recent Labs Lab 04/24/14 2206  TROPONINI <0.30   BNP: No components found with this basename: POCBNP,  CBG:  Recent Labs Lab 04/24/14 0744 04/24/14 1212 04/24/14 1721 04/24/14 2103 04/25/14 0733  GLUCAP 131* 112* 105* 145* 157*   Microbiology: Lab Results  Component Value Date   CULT  Value:        BLOOD  CULTURE RECEIVED NO GROWTH TO DATE CULTURE WILL BE HELD FOR 5 DAYS BEFORE ISSUING A FINAL NEGATIVE REPORT Performed at [facility] 04/23/2014   CULT  Value:        BLOOD CULTURE RECEIVED NO GROWTH TO DATE CULTURE WILL BE HELD FOR 5 DAYS BEFORE ISSUING A FINAL NEGATIVE REPORT Performed at [facility] 04/23/2014   CULT  Value: NO GROWTH 5 DAYS Performed at [facility] 04/11/2014   CULT  Value: NO GROWTH 5 DAYS Performed at [facility] 04/11/2014   CULT  Value: ESCHERICHIA COLI Performed at [facility] 11/09/2013    Recent Labs Lab 04/23/14 2042 04/23/14 2049  CULT        BLOOD CULTURE RECEIVED NO GROWTH TO DATE CULTURE WILL BE HELD FOR 5 DAYS BEFORE ISSUING A FINAL NEGATIVE REPORT Performed at [facility]        BLOOD CULTURE RECEIVED NO GROWTH TO DATE CULTURE WILL BE HELD FOR 5 DAYS BEFORE ISSUING A FINAL NEGATIVE REPORT Performed at [facility]  SDES BLOOD RIGHT FOREARM BLOOD HAND RIGHT    Imaging: Dg Chest 2 View  04/24/2014   CLINICAL DATA:  Patient with hospital acquired pneumonia  EXAM: CHEST  2 VIEW  COMPARISON:  Chest radiograph 04/23/2014  FINDINGS: Low lung volumes. Stable cardiac and mediastinal contours. Heterogeneous opacities right mid and lower lung. Minimal heterogeneous opacities left lung base. Small bilateral pleural effusions. High-riding right humeral head. Sclerotic lesion proximal right humerus may represent enchondroma are bone infarct. Thoracic spine degenerative change.  IMPRESSION: Low lung volumes.  Right mid and lower lung as well as left lower lung heterogeneous opacities which may represent scattered atelectasis or infection.   Electronically Signed   By: Annia Belt M.D.   On: 04/24/2014 09:11   Ct Head Wo Contrast  04/23/2014   CLINICAL DATA:  Generalized weakness, acute onset. Initial encounter.  EXAM: CT HEAD WITHOUT CONTRAST  TECHNIQUE: Contiguous axial images were obtained from the base of the skull  through the vertex without intravenous contrast.  COMPARISON:  None.  FINDINGS: There is no evidence of acute infarction, or intra- or extra-axial hemorrhage on CT.  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 Prominence of the ventricles and sulci reflects mild to moderate cortical volume loss. Mild cerebellar atrophy is noted. Scattered periventricular white matter change likely reflects small vessel ischemic microangiopathy.  The brainstem and fourth ventricle are within normal limits. The basal ganglia are unremarkable in appearance. The cerebral hemispheres demonstrate grossly normal gray-white differentiation. No mass effect or midline shift is seen.  There is no evidence of fracture; visualized osseous structures  are unremarkable in appearance. The visualized portions of the orbits are within normal limits. The paranasal sinuses and mastoid air cells are well-aerated. No significant soft tissue abnormalities are seen.  IMPRESSION: 1. No acute intracranial pathology seen on CT. 2. 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 Sudden death due to a colloid cyst at this age is rare, but would usually present with headaches and/or vomiting. 3. Mild to moderate cortical volume loss and scattered small vessel ischemic microangiopathy.   Electronically Signed   By: Roanna Raider M.D.   On: 04/23/2014 22:17   Ct Angio Chest Pe W/cm &/or Wo Cm  04/25/2014   CLINICAL DATA:  Shortness of breath.  Tachycardia.  Pneumonia.  EXAM: CT ANGIOGRAPHY CHEST WITH CONTRAST  TECHNIQUE: Multidetector CT imaging of the chest was performed using the standard protocol during bolus administration of intravenous contrast. Multiplanar CT image reconstructions and MIPs were obtained to evaluate the vascular anatomy.  CONTRAST:  OMNIPAQUE IOHEXOL 350 MG/ML SOLN  COMPARISON:  Two-view chest x-ray 04/24/2014.  FINDINGS: Pulmonary arterial opacification is excellent. There  are no focal filling defects to suggest pulmonary emboli. The study is mildly degraded by patient breathing motion. This limits evaluation of distal small vessels.  A moderate-sized pericardial effusion measures up to 19 mm. This appears to be low-density. The heart size is normal. Atherosclerotic calcifications are noted in the aortic arch.  Bilateral pleural effusions are present. Associated mild airspace disease is present. No significant upper lobe airspace disease is present.  Bone window is are unremarkable. Mild exaggerated kyphosis is present in the thoracic spine.  Review of the MIP images confirms the above findings.  IMPRESSION: 1. Moderate-sized pericardial effusion. Developing tamponade is considered. 2. No evidence for pulmonary embolus. 3. Small moderate bilateral pleural effusions with associated airspace disease, likely atelectasis. 4. Significant patient breathing motion limits evaluation of distal small vessels. Critical Value/emergent results were called by telephone at the time of interpretation on 04/25/2014 at 1:14 am to Dr. Luisa Dago, who verbally acknowledged these results.   Electronically Signed   By: Gennette Pac M.D.   On: 04/25/2014 01:14   Dg Chest Port 1 View  04/23/2014   CLINICAL DATA:  Code sepsis. Acute onset of diffuse chest pain, shortness of breath and weakness. Initial encounter.  EXAM: PORTABLE CHEST - 1 VIEW  COMPARISON:  Chest radiograph performed 04/12/2014  FINDINGS: The lungs are well-aerated and clear. There is no evidence of focal opacification, pleural effusion or pneumothorax.  The cardiomediastinal silhouette is borderline enlarged. No acute osseous abnormalities are seen. Sclerotic change within the right humeral head and neck likely reflects a remote bone infarct.  IMPRESSION: No acute cardiopulmonary process seen. Borderline cardiomegaly noted.   Electronically Signed   By: Roanna Raider M.D.   On: 04/23/2014 21:30     ASSESSMENT:  1. AF with  RVR 2. Probable PNA vs viral syndrome 3. Pericardial effusion, moderate 4. HTN  5. DM2  PLAN/DISCUSSION:  She has developed AF in setting of recent PNA/viral illness. Now back in NSR on amiodarone. Would continue for now. I have reviewed echo and she has moderate pericardial effusion posterior > anterior. There is significant resp variation in mitral inflow but no other convincing evidence of tamponade. She is hypertensive on exam. Effusion not easily amenable to percutaneous drainage -> treat medically.   Would continue supportive care at this point. Agree with not anti-coagulating fully in setting of pericardial effusion.   If  not improving soon, would consider re-addressing code status and aggressiveness of care      LOS: 2 days  Arvilla Meresaniel Bensimhon, MD 04/25/2014, 10:10 AM

## 2014-04-25 NOTE — Consult Note (Signed)
PULMONARY / CRITICAL CARE MEDICINE   Name: Wendy Vasquez MRN: [id number] DOB: 21-Dec-1923    ADMISSION DATE:  04/23/2014 CONSULTATION DATE:  04/25/2014  REFERRING MD :  Cyndie Chime  CHIEF COMPLAINT:  New A-fib with RVR, Fever, Hypotension  INITIAL PRESENTATION: 80 y.o. F admitted 10/5 with sepsis secondary to presumed HCAP.  On evening of 10/6, RRT was called for tachycardia, fever, and hypotension.  Pt was found to have new A-fib with RVR.  BP responded somewhat to fluids; however, request was made for transfer to ICU.  PCCM was consulted.   STUDIES:  CT Head 10/5 >>> no acute intracranial pathology, 1.1 cm hyperdense colloid cyst at roof of 3rd ventricle. EKG 10/6 >>> AFib with RVR. CTA chest 10/7 >>> moderate sized pericardial effusion, developing tamponade is considered.  No evidence of PE, small b/l pleural effusions. Bedside echo 10/6 (Dr. Zachery Conch - cards) >>> small pericardial effusion, no tamponade physiology.  SIGNIFICANT EVENTS: 10/5 - admitted with HCAP / sepsis 10/6 - RRT called for new onset A.fib, fever, hypotension.  PCCM consulted for transfer to ICU.   HISTORY OF PRESENT ILLNESS:  Pt is encephalopathic; therefore, this HPI is obtained from chart review. Wendy Vasquez is an 80 y.o. F who was admitted 10/5 with HCAP and sepsis.  Of note, she was just hospitalized in September for CAP and was discharged on Augmentin.  On 10/7, she developed new onset A.fib with RVR, fever to 103 and was hypotensive with SBP in 80's.  RRT and cardiology were both called. Cards started pt on amiodarone and added 500cc bolus to which BP responded well. Request was made to transfer pt to ICU and PCCM was consulted.  PAST MEDICAL HISTORY :  Past Medical History  Diagnosis Date  . Hypertension   . HLD (hyperlipidemia)   . Bleeding ulcer     requiring transfusions  . H/O hiatal hernia   . History of blood transfusion     "related to bleeding ulcers"  . Asthma     "a touch"  . Pneumonia X  1  . Diabetes mellitus without complication     "diet controlled"  . GERD (gastroesophageal reflux disease)   . Migraine     hx  . Daily headache     "last couple days" (04/24/2014)  . Arthritis     "all over"   Past Surgical History  Procedure Laterality Date  . Total knee arthroplasty Bilateral   . Joint replacement    . Cataract extraction  "years ago"    "don't know which side; don't know if they put lens in"  . Dilation and curettage of uterus      "after she lost a child, I'd assume"   Prior to Admission medications   Medication Sig Start Date End Date Taking? Authorizing Provider  acetaminophen (TYLENOL) 500 MG tablet Take 1,000 mg by mouth 4 (four) times daily. Take everyday per daughter   Yes Historical Provider, MD  ADVAIR DISKUS 250-50 MCG/DOSE AEPB Inhale 1 puff into the lungs 2 (two) times daily.  09/27/13  Yes Historical Provider, MD  ALPRAZolam Prudy Feeler) 0.25 MG tablet Take 0.5 tablets by mouth at bedtime.  11/06/13  Yes Historical Provider, MD  amoxicillin-clavulanate (AUGMENTIN) 500-125 MG per tablet Take 1 tablet by mouth 2 (two) times daily. Started medication on 04-14-14 04/13/14  Yes Dionne Ano, MD  aspirin 81 MG tablet Take 81 mg by mouth daily.   Yes Historical Provider, MD  calcium-vitamin D (OSCAL WITH D) 500-200  MG-UNIT per tablet Take 1 tablet by mouth 2 (two) times daily.   Yes Historical Provider, MD  Cranberry 1000 MG CAPS Take 2,000 mg by mouth daily with breakfast.    Yes Historical Provider, MD  docusate sodium (COLACE) 100 MG capsule Take 200 mg by mouth at bedtime.   Yes Historical Provider, MD  esomeprazole (NEXIUM) 40 MG capsule Take 40 mg by mouth at bedtime.    Yes Historical Provider, MD  gemfibrozil (LOPID) 600 MG tablet Take 600 mg by mouth 2 (two) times daily.  10/23/13  Yes Historical Provider, MD  guaiFENesin (MUCINEX) 600 MG 12 hr tablet Take 600 mg by mouth 2 (two) times daily as needed for cough or to loosen phlegm.   Yes Historical Provider,  MD  HYDROcodone-homatropine (HYCODAN) 5-1.5 MG/5ML syrup Take 5 mLs by mouth every 6 (six) hours as needed for cough.   Yes Historical Provider, MD  metoCLOPramide (REGLAN) 10 MG tablet Take 10 mg by mouth daily with breakfast.    Yes Historical Provider, MD  Multiple Vitamin (MULTIVITAMIN WITH MINERALS) TABS tablet Take 1 tablet by mouth daily.   Yes Historical Provider, MD  polyethylene glycol (MIRALAX / GLYCOLAX) packet Take 17 g by mouth daily as needed for mild constipation.   Yes Historical Provider, MD  Probiotic Product (PROBIOTIC PO) Take 1 capsule by mouth daily.   Yes Historical Provider, MD  tetrahydrozoline (VISINE) 0.05 % ophthalmic solution Place 1 drop into both eyes daily as needed (for dry eyes).   Yes Historical Provider, MD  vitamin B-12 (CYANOCOBALAMIN) 1000 MCG tablet Take 1,000 mcg by mouth daily with breakfast.    Yes Historical Provider, MD   Allergies  Allergen Reactions  . Ciprofloxacin Hives  . Sulphur [Sulfur] Anaphylaxis    FAMILY HISTORY:  Family History  Problem Relation Age of Onset  . Colon cancer Mother    SOCIAL HISTORY:  reports that she has quit smoking. Her smoking use included Cigarettes. She smoked 0.00 packs per day. She has never used smokeless tobacco. She reports that she does not drink alcohol or use illicit drugs.  REVIEW OF SYSTEMS:  Unable to obtain as pt is encephalopathic.  SUBJECTIVE:  Improved to SR  VITAL SIGNS: Temp:  [98 F (36.7 C)-103.1 F (39.5 C)] 101 F (38.3 C) (10/07 0200) Pulse Rate:  [80-176] 176 (10/07 0055) Resp:  [18-22] 18 (10/06 2100) BP: (87-130)/(54-76) 107/66 mmHg (10/07 0200) SpO2:  [95 %-100 %] 97 % (10/07 0055) Weight:  [56.4 kg (124 lb 5.4 oz)] 56.4 kg (124 lb 5.4 oz) (10/06 0500) HEMODYNAMICS:   VENTILATOR SETTINGS:   INTAKE / OUTPUT: Intake/Output     10/06 0701 - 10/07 0700   P.O. 480   Total Intake(mL/kg) 480 (8.5)   Urine (mL/kg/hr) 204 (0.2)   Total Output 204   Net +276          PHYSICAL EXAMINATION: General: Elderly chronically ill appearing female, in NAD. Neuro: Unable to answer questions or follow commands.  No focal deficits. HEENT: [place]/AT. PERRL, sclerae anicteric.  No JVD. Cardiovascular: IRIR, tachy, no M/R/G.  Lungs: Respirations even and unlabored.  CTA bilaterally, No W/R/R. Abdomen: BS x 4, soft, NT/ND.  Musculoskeletal: No gross deformities, no edema.  Skin: Intact, dry, no rashes.    LABS:  CBC  Recent Labs Lab 04/23/14 2047 04/24/14 0545  WBC 13.5* 9.1  HGB 12.0  13.9 9.9*  HCT 36.5  41.0 30.7*  PLT 383 332   Coag's  Recent Labs  Lab 04/23/14 2047  APTT 41*  INR 1.22   BMET  Recent Labs Lab 04/23/14 2047 04/24/14 0545 04/24/14 2206  NA 135*  137 139 137  K 4.1  4.0 4.1 4.1  CL 99  104 106 103  CO2 23 20 22   BUN 13  12 11 12   CREATININE 0.81  0.90 0.68 0.83  GLUCOSE 176*  179* 116* 147*   Electrolytes  Recent Labs Lab 04/23/14 2047 04/24/14 0545 04/24/14 2206  CALCIUM 10.2 9.6 9.2  MG  --   --  1.8   Sepsis Markers  Recent Labs Lab 04/23/14 2047 04/24/14 2206  LATICACIDVEN 2.14  --   PROCALCITON  --  0.15   ABG No results found for this basename: PHART, PCO2ART, PO2ART,  in the last 168 hours Liver Enzymes  Recent Labs Lab 04/23/14 2047 04/24/14 2206  AST 20 15  ALT 6 6  ALKPHOS 113 81  BILITOT 0.4 0.3  ALBUMIN 3.1* 2.5*   Cardiac Enzymes  Recent Labs Lab 04/23/14 2048 04/24/14 2206  TROPONINI  --  <0.30  PROBNP 403.4  --    Glucose  Recent Labs Lab 04/23/14 2349 04/24/14 0744 04/24/14 1212 04/24/14 1721 04/24/14 2103  GLUCAP 134* 131* 112* 105* 145*    Imaging Dg Chest 2 View  04/24/2014   CLINICAL DATA:  Patient with hospital acquired pneumonia  EXAM: CHEST  2 VIEW  COMPARISON:  Chest radiograph 04/23/2014  FINDINGS: Low lung volumes. Stable cardiac and mediastinal contours. Heterogeneous opacities right mid and lower lung. Minimal heterogeneous opacities left lung  base. Small bilateral pleural effusions. High-riding right humeral head. Sclerotic lesion proximal right humerus may represent enchondroma are bone infarct. Thoracic spine degenerative change.  IMPRESSION: Low lung volumes.  Right mid and lower lung as well as left lower lung heterogeneous opacities which may represent scattered atelectasis or infection.   Electronically Signed   By: Annia Belt M.D.   On: 04/24/2014 09:11   Ct Head Wo Contrast  04/23/2014   CLINICAL DATA:  Generalized weakness, acute onset. Initial encounter.  EXAM: CT HEAD WITHOUT CONTRAST  TECHNIQUE: Contiguous axial images were obtained from the base of the skull through the vertex without intravenous contrast.  COMPARISON:  None.  FINDINGS: There is no evidence of acute infarction, or intra- or extra-axial hemorrhage on CT.  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 Prominence of the ventricles and sulci reflects mild to moderate cortical volume loss. Mild cerebellar atrophy is noted. Scattered periventricular white matter change likely reflects small vessel ischemic microangiopathy.  The brainstem and fourth ventricle are within normal limits. The basal ganglia are unremarkable in appearance. The cerebral hemispheres demonstrate grossly normal gray-white differentiation. No mass effect or midline shift is seen.  There is no evidence of fracture; visualized osseous structures are unremarkable in appearance. The visualized portions of the orbits are within normal limits. The paranasal sinuses and mastoid air cells are well-aerated. No significant soft tissue abnormalities are seen.  IMPRESSION: 1. No acute intracranial pathology seen on CT. 2. 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 Sudden death due to a colloid cyst at this age is rare, but would usually present with headaches and/or vomiting. 3. Mild to moderate cortical volume loss and scattered small vessel ischemic  microangiopathy.   Electronically Signed   By: Roanna Raider M.D.   On: 04/23/2014 22:17   Ct Angio Chest Pe W/cm &/  or Wo Cm  04/25/2014   CLINICAL DATA:  Shortness of breath.  Tachycardia.  Pneumonia.  EXAM: CT ANGIOGRAPHY CHEST WITH CONTRAST  TECHNIQUE: Multidetector CT imaging of the chest was performed using the standard protocol during bolus administration of intravenous contrast. Multiplanar CT image reconstructions and MIPs were obtained to evaluate the vascular anatomy.  CONTRAST:  OMNIPAQUE IOHEXOL 350 MG/ML SOLN  COMPARISON:  Two-view chest x-ray 04/24/2014.  FINDINGS: Pulmonary arterial opacification is excellent. There are no focal filling defects to suggest pulmonary emboli. The study is mildly degraded by patient breathing motion. This limits evaluation of distal small vessels.  A moderate-sized pericardial effusion measures up to 19 mm. This appears to be low-density. The heart size is normal. Atherosclerotic calcifications are noted in the aortic arch.  Bilateral pleural effusions are present. Associated mild airspace disease is present. No significant upper lobe airspace disease is present.  Bone window is are unremarkable. Mild exaggerated kyphosis is present in the thoracic spine.  Review of the MIP images confirms the above findings.  IMPRESSION: 1. Moderate-sized pericardial effusion. Developing tamponade is considered. 2. No evidence for pulmonary embolus. 3. Small moderate bilateral pleural effusions with associated airspace disease, likely atelectasis. 4. Significant patient breathing motion limits evaluation of distal small vessels. Critical Value/emergent results were called by telephone at the time of interpretation on 04/25/2014 at 1:14 am to Dr. Luisa Dago, who verbally acknowledged these results.   Electronically Signed   By: Gennette Pac M.D.   On: 04/25/2014 01:14   Dg Chest Port 1 View  04/23/2014   CLINICAL DATA:  Code sepsis. Acute onset of diffuse chest pain,  shortness of breath and weakness. Initial encounter.  EXAM: PORTABLE CHEST - 1 VIEW  COMPARISON:  Chest radiograph performed 04/12/2014  FINDINGS: The lungs are well-aerated and clear. There is no evidence of focal opacification, pleural effusion or pneumothorax.  The cardiomediastinal silhouette is borderline enlarged. No acute osseous abnormalities are seen. Sclerotic change within the right humeral head and neck likely reflects a remote bone infarct.  IMPRESSION: No acute cardiopulmonary process seen. Borderline cardiomegaly noted.   Electronically Signed   By: Roanna Raider M.D.   On: 04/23/2014 21:30    ASSESSMENT / PLAN:  PULMONARY A: Acute hypoxemic respiratory failure HCAP -Aspiration more likely with parkinsons R/o URI / viral process / CAP Asthma Small b/l pleural effusions P:   Supplemental O2 as needed to maintain SpO2 > 92%. Cultures / Abx as per ID section. D/c dulera, change to brovana / budesonide for now. Continue albuterol. Follow up pcxr in am  IS when able SLP needed  CARDIOVASCULAR A. Line 10/7 >>> A:  A.Fib with RVR - pt appears clinically dry, this plus  Pericardial effusion - Per Dr. Zachery Conch (cards), bedside echo did NOT reveal tamponade physiology. Hx HTN - now hypotensive Hx HLD P:  500cc IVF boluses as needed for rapid heart rate / hypotension. Add low dose neosynephrine MAp goal 60 Hold anticoagulation per cards. Continue amiodarone. TTE in AM per cards, re assess officially effusion If boluses / amio / neo does not help rapid A.fib, would consider cardioversion. Goal MAP > 60 Consider CVL for CVP's, hope to avoid Trend lactate for clearance  RENAL A:   At risk ATN P:   NS @ 100. BMP in AM.  GASTROINTESTINAL A:   GI prophylaxis Nutrition P:   NPO. Needs SLP official with parkinsons  HEMATOLOGIC A:   VTE Prophylaxis Hx of prior bleeding ulcers P:  SCD's / Heparin. CBC in AM. Transfuse per usual ICU guidelines.  INFECTIOUS A:    Sepsis - in setting of presumed HCAP Highly likely aspiration P:   BCx2 10/5 >>> UCx 10/5 >>> Sputum Cx 10/5 >>> RVP 10/6 >>> Mycoplasma Ab. 10/6 >>> U. Legionella 10/6 >>> Abx: Azithro, start date 10/6, day 2/x. Abx: Vanc, start date 10/5, day 2/x. Abx: Zosyn, start date 10/5, day 2/x.  ENDOCRINE A:   DM P:   CBG's q4hr. SSI.  NEUROLOGIC A:   Acute metabolic / toxic encephalopathy  Anxiety parkinsons dz, at risk NMS (see NO home meds for parkinsonism??) P:   Hold xanax for now. Monitor clinically. cpk No antipsychotics, no haldol etc Ensure parkinson's meds given Need further history from daughter  TODAY'S SUMMARY: 80 y.o. F transferred from floor with new onset A.fib RVR, fever, hypotension.  Was being treated for HCAP.  Cards consulted, amio started.  Giving 500cc boluses as pt appears clinically dry.  Once stabilizes, can transfer back to IMTS. At risk NMS, does she have parkinons?, cpk, pos balance   Rutherford Guysahul Desai, [place] - [facility] Pgr: [phone]  or [phone] 04/25/2014, 2:48 AM  I have personally obtained a history, examined the patient, evaluated laboratory and imaging results, formulated the assessment and plan and placed orders. CRITICAL CARE: The patient is critically ill with multiple organ systems failure and requires high complexity decision making for assessment and support, frequent evaluation and titration of therapies, application of advanced monitoring technologies and extensive interpretation of multiple databases. Critical Care Time devoted to patient care services described in this note is 30 minutes.   Mcarthur Rossettianiel J. Tyson AliasFeinstein, MD, FACP Pgr: [phone] [facility]  Pulmonary and Critical Care Medicine  HealthCare Pager: [phone]

## 2014-04-25 NOTE — Clinical Social Work Note (Signed)
Report given to [facility] CSW. Niece updated.  Roddie McBryant Calisa Luckenbaugh MSW, [place]CSWA, [place], [phone]

## 2014-04-25 NOTE — Progress Notes (Signed)
INITIAL NUTRITION ASSESSMENT  DOCUMENTATION CODES Per approved criteria  -Not Applicable   INTERVENTION: Supplement diet as appropriate.   NUTRITION DIAGNOSIS: Inadequate oral intake related to inability to eat as evidenced by NPO status.   Goal: Pt to meet >/= 90% of their estimated nutrition needs   Monitor:  Diet advancement, PO intake, weight trends, labs  Reason for Assessment: Pt identified as at nutrition risk on the Malnutrition Screen Tool  80 y.o. female  Admitting Dx: HCAP (healthcare-associated pneumonia)  ASSESSMENT: Pt admitted to hospital after failed outpatient treatment for PNA. Pt is unable to provide any history or answer questions. No family present. Per record review no recent weight loss. While pt on diet was eating > 75% of her needs.   Nutrition Focused Physical Exam:  Subcutaneous Fat:  Orbital Region: WDL Upper Arm Region: WDL Thoracic and Lumbar Region: WDL  Muscle:  Temple Region: severe depletion  Clavicle Bone Region: WDL Clavicle and Acromion Bone Region: WDL Scapular Bone Region: WDL Dorsal Hand: mild/moderate depletion  Patellar Region: WDL Anterior Thigh Region: WDL Posterior Calf Region: WDL  Edema: not present   Height: Ht Readings from Last 1 Encounters:  04/25/14 5\' 7"  (1.702 m)    Weight: Wt Readings from Last 1 Encounters:  04/25/14 139 lb 12.4 oz (63.4 kg)    Ideal Body Weight: 61.3 kg   % Ideal Body Weight: 103%  Wt Readings from Last 10 Encounters:  04/25/14 139 lb 12.4 oz (63.4 kg)  04/13/14 127 lb 14.4 oz (58.015 kg)  11/10/13 128 lb 15.5 oz (58.5 kg)    Usual Body Weight: unknown  % Usual Body Weight: -  BMI:  Body mass index is 21.89 kg/(m^2).  Estimated Nutritional Needs: Kcal: 1400-1600 Protein: 70-85 grams Fluid: > 1.5 L/day  Skin: ecchymosis  Diet Order: NPO Meal Completion: 75-90% while on Carb Modified diet   EDUCATION NEEDS: -No education needs identified at this  time   Intake/Output Summary (Last 24 hours) at 04/25/14 1358 Last data filed at 04/25/14 1300  Gross per 24 hour  Intake 5802.96 ml  Output    788 ml  Net 5014.96 ml    Last BM: 10/7   Labs:   Recent Labs Lab 04/24/14 0545 04/24/14 2206 04/25/14 0700  NA 139 137 137  K 4.1 4.1 4.4  CL 106 103 106  CO2 20 22 19   BUN 11 12 11   CREATININE 0.68 0.83 0.72  CALCIUM 9.6 9.2 9.2  MG  --  1.8 2.0  GLUCOSE 116* 147* 154*    CBG (last 3)   Recent Labs  04/24/14 2103 04/25/14 0733 04/25/14 1116  GLUCAP 145* 157* 166*    Scheduled Meds: . acidophilus  1 capsule Oral Daily  . antiseptic oral rinse  7 mL Mouth Rinse BID  . arformoterol  15 mcg Nebulization BID  . aspirin EC  81 mg Oral Daily  . azithromycin  250 mg Intravenous Daily  . budesonide  0.5 mg Nebulization BID  . calcium-vitamin D  1 tablet Oral BID WC  . docusate sodium  200 mg Oral QHS  . gemfibrozil  600 mg Oral BID  . heparin  5,000 Units Subcutaneous 3 times per day  . insulin aspart  0-9 Units Subcutaneous TID WC  . metoCLOPramide  10 mg Oral Q breakfast  . multivitamin with minerals  1 tablet Oral Daily  . pantoprazole  40 mg Oral Daily  . piperacillin-tazobactam (ZOSYN)  IV  3.375 g Intravenous 3  times per day  . vancomycin  750 mg Intravenous Q24H  . vitamin B-12  1,000 mcg Oral Q breakfast    Continuous Infusions: . sodium chloride 100 mL/hr at 04/25/14 0300  . amiodarone 30 mg/hr (04/25/14 1300)  . phenylephrine (NEO-SYNEPHRINE) Adult infusion Stopped (04/25/14 [id number])    Past Medical History  Diagnosis Date  . Hypertension   . HLD (hyperlipidemia)   . Bleeding ulcer     requiring transfusions  . H/O hiatal hernia   . History of blood transfusion     "related to bleeding ulcers"  . Asthma     "a touch"  . Pneumonia X 1  . Diabetes mellitus without complication     "diet controlled"  . GERD (gastroesophageal reflux disease)   . Migraine     hx  . Daily headache     "last  couple days" (04/24/2014)  . Arthritis     "all over"    Past Surgical History  Procedure Laterality Date  . Total knee arthroplasty Bilateral   . Joint replacement    . Cataract extraction  "years ago"    "don't know which side; don't know if they put lens in"  . Dilation and curettage of uterus      "after she lost a child, I'd assume"    Kendell BaneHeather Talib Headley RD, LDN, CNSC [phone] Pager [phone] After Hours Pager

## 2014-04-25 NOTE — Progress Notes (Signed)
Patient ID: Wendy Vasquez, female   DOB: [birth year], 80 y.o.   MRN: [id number] Medicine attending: We appreciate the prompt assistance from critical care and cardiology. We examined the patient this morning with the medical team and reviewed her radiographs with the radiologist. Patient admitted with progressive respiratory complaints following initial hospitalization for what was treated as a bacterial pneumonia although initial chest radiograph was negative. My concerns on readmission yesterday were that this was an atypical infection, probably viral. Minimal changes on chest radiograph with persistent hacking cough and considerable patient distress. She was put back on broad-spectrum parenteral antibiotics with the addition of azithromycin to cover opportunistic such as mycoplasma. Unfortunately, during the night, she developed high fever and the acute onset of atrial fibrillation at a rapid ventricular rate. Additional studies done at that time to exclude acute pulmonary embolus did not show any pulmonary emboli but did show a moderate sized pericardial effusion and small bilateral pleural effusions. No gross pulmonary infiltrates were noted. Initial attempt at stabilization by the medical residents with the initial administration of parenteral beta blockers with no improvement. Blood pressure fell so Cardizem was not a good choice to break her arrhythmia. The resident discussed the situation with cardiology who suggested an amiodarone infusion. Patient was transferred to the critical care service for closer attention and further management of her arrhythmia and pericardial effusion. Bedside echocardiogram this morning with results as recorded by cardiology Dr. Gala RomneyBensimhon. No evidence for significant hemodynamic compromise/tamponade at this time. Fortunately, she has now reverted to sinus rhythm on amiodarone. We will continue to follow her closely until she is stable enough to return to the medical  service.  Wendy DarbyJames Darlyne Schmiesing, MD, FACP  Hematology-Oncology/Internal Medicine

## 2014-04-25 NOTE — Progress Notes (Addendum)
Received patient from [facility] via bed. Patient awake, moaning. HR 183 SVT. BP stable at this time with 500 cc NS bolus infusing.Plan to receive more boluses. Patient incont of urine, and unable to verbalize need to urinate. Strict I&O monitoring needed. Foley huddle completed. Patient critical with suspected sepsis. Peri care done. 14 Fr temp foley placed under sterile technique. Clear yellow urine noted on return.

## 2014-04-25 NOTE — Evaluation (Signed)
Clinical/Bedside Swallow Evaluation Patient Details  Name: Wendy Vasquez MRN: [id number] Date of Birth: 1924/03/18  Today's Date: 04/25/2014 Time: [id number] SLP Time Calculation (min): 25 min  Past Medical History:  Past Medical History  Diagnosis Date  . Hypertension   . HLD (hyperlipidemia)   . Bleeding ulcer     requiring transfusions  . H/O hiatal hernia   . History of blood transfusion     "related to bleeding ulcers"  . Asthma     "a touch"  . Pneumonia X 1  . Diabetes mellitus without complication     "diet controlled"  . GERD (gastroesophageal reflux disease)   . Migraine     hx  . Daily headache     "last couple days" (04/24/2014)  . Arthritis     "all over"   Past Surgical History:  Past Surgical History  Procedure Laterality Date  . Total knee arthroplasty Bilateral   . Joint replacement    . Cataract extraction  "years ago"    "don't know which side; don't know if they put lens in"  . Dilation and curettage of uterus      "after she lost a child, I'd assume"   HPI:  80 y.o. female with past medical history significant for non-insulin-dependent diabetes, hypertension, hyperlipidemia, lives at home with her daughter who provides the history. States that patient has been in and out of the hospital for bronchitis and pneumonia over the last month.  Now with sepsis secondary to presumed HCAP.  10/6 Rapid Response called for tachycardia, fever, hypotension; found to have new A-fib with RVR.  CCT no acute event.  Supplemental O2 needed to maintain SpO2 > 92%. Per OT, pt with multiple joint contractures and was total assist for ADLs PTA. No prior swallow evals per record review.   Dtr reports no hx of swallowing deficits; pt eats regular foods.  She has required hand-feeding for the last year.     Assessment / Plan / Recommendation Clinical Impression  Pt presents with adequate mastication, swift swallow response, and no overt s/s of aspiration.  She exhibits  appropriate exhalation post-swallow, and RR is well within the range that allows sufficient swallow/ventilatory coordination.  Eructation notable after assessment - daughter describes reflux.  Doubt dysphagia-related aspiration pna; consider esophageal assessment.   Recommend resumption of regular diet, thin liquids; pt is a total feed - recommend careful hand-feeding by staff.     Aspiration Risk  Mild    Diet Recommendation Regular;Thin liquid   Liquid Administration via: Cup;Straw Medication Administration: Whole meds with liquid Supervision: Trained caregiver to feed patient;Staff to assist with self feeding Compensations: Slow rate;Small sips/bites Postural Changes and/or Swallow Maneuvers: Seated upright 90 degrees;Upright 30-60 min after meal    Other  Recommendations Recommended Consults: Consider esophageal assessment Oral Care Recommendations: Oral care BID   Follow Up Recommendations  None        Swallow Study Prior Functional Status       General Date of Onset: 04/23/14 HPI: 80 y.o. female with past medical history significant for non-insulin-dependent diabetes, hypertension, hyperlipidemia, lives at home with her daughter who provides the history. States that patient has been in and out of the hospital for bronchitis and pneumonia over the last month.  Now with sepsis secondary to presumed HCAP.  10/6 Rapid Response called for tachycardia, fever, hypotension; found to have new A-fib with RVR.  CCT no acute event.  Supplemental O2 needed to maintain SpO2 > 92%. Per OT,  pt with multiple joint contractures and was total assist for ADLs PTA. No prior swallow evals per record review.   Dtr reports no hx of swallowing deficits; pt eats regular foods.  She has required hand-feeding for the last year.   Type of Study: Bedside swallow evaluation Previous Swallow Assessment: none per records Diet Prior to this Study: NPO Temperature Spikes Noted: Yes Respiratory Status: Nasal  cannula Behavior/Cognition: Alert Oral Cavity - Dentition: Adequate natural dentition Self-Feeding Abilities: Total assist Patient Positioning: Upright in bed Baseline Vocal Quality: Clear;Low vocal intensity Volitional Cough: Weak Volitional Swallow: Unable to elicit    Oral/Motor/Sensory Function Overall Oral Motor/Sensory Function: Appears within functional limits for tasks assessed (symmetric - difficulty f/c )   Ice Chips Ice chips: Within functional limits   Thin Liquid Thin Liquid: Within functional limits Presentation: Cup    Nectar Thick Nectar Thick Liquid: Not tested   Honey Thick Honey Thick Liquid: Not tested   Puree Puree: Within functional limits Presentation: Spoon   Solid   Wendy Vasquez, [place] CCC/SLP Pager [phone]     Solid: Within functional limits       Wendy Vasquez 04/25/2014,4:42 PM

## 2014-04-25 NOTE — Progress Notes (Signed)
RTx2 unable to insert arterial line. MD aware.

## 2014-04-25 NOTE — Progress Notes (Signed)
eLink Physician-Brief Progress Note Patient Name: Wendy [name] DOB: 01-12-1924 MRN: [id number]   Date of Service  04/25/2014  HPI/Events of Note  Remains tachycardic and BP improved.  eICU Interventions  Will give 5 mg lopressor IV x one and monitor.     Intervention Category Major Interventions: Other:  Riniyah Speich 04/25/2014, 6:50 AM

## 2014-04-25 NOTE — Clinical Social Work Psychosocial (Signed)
Clinical Social Work Department BRIEF PSYCHOSOCIAL ASSESSMENT 04/25/2014  Patient:  Wendy Vasquez,Wendy Vasquez     Account Number:  [id number]     Admit date:  04/23/2014  Clinical Social Worker:  Lavell LusterAMPBELL,Mahima Hottle BRYANT, LCSWA  Date/Time:  04/24/2014 03:45 PM  Referred by:  Physician  Date Referred:  04/24/2014 Referred for  SNF Placement   Other Referral:   Family requesting SNF placement.   Interview type:  Family Other interview type:   Patient unable to contribute to assessment at this time. Patient's niece interviewed as no other family available to complete assessment at this time.    PSYCHOSOCIAL DATA Living Status:  FAMILY Admitted from facility:   Level of care:   Primary support name:  Wendy Vasquez Primary support relationship to patient:  CHILD, ADULT Degree of support available:   Support is strong.    CURRENT CONCERNS Current Concerns  Post-Acute Placement   Other Concerns:   NA    SOCIAL WORK ASSESSMENT / PLAN CSW spoke with patient's niece regarding SNF placement. Niece Wendy Vasquez states that the family plans [facility] the [facility]. Patient lives with daughter who is overwhelmed by the responsibility of caring for patient.  Per chart, treatment team attempted SNF placement for patient during previous hospitalization, but insurance company denied patient placement. CSW explained that this could potentially happen again. Family is prepared for denial but would like to attempt authorization again. CSW explained SNF search/placement process and answered questions. CSW will assist with placement.   Assessment/plan status:  Psychosocial Support/Ongoing Assessment of Needs Other assessment/ plan:   Complete FL2, Fax, PASRR   Information/referral to community resources:   CSW contact information and SNF list given.    PATIENT'S/FAMILY'S RESPONSE TO PLAN OF CARE: Patient's family plans for patient to DC to SNF when stable. CSW will assist.        Roddie McBryant Kerensa Nicklas MSW, [place], [place], [phone]

## 2014-04-25 NOTE — Significant Event (Signed)
Rapid Response Event Note Repeat Call tonight per SD RN. Pt with sustained HR 160s. RN called for assist with transport for CT scan.  Overview: Time Called: 2330 Arrival Time: 2332 Event Type: Cardiac  Initial Focused Assessment: Pt moaning and coughing. Appears uncomfortable in bed. Po2 96-97 on 2 LNC. HR 140-160 Afib.   BP WNL at this time.IV bolus infusing.  Interventions: Rectal temp obtained yielding 101.8. Tylenol and xanax given. Pt transported for CT of chest at 0015. Upon arrival back to room, pt incontinent of urine, cool bath given. Hycodan cough syrup given for cough.  HR 170 and bp 87/65. Resident paged and updated on pt status. Preliminary results of CT Chest reveal small pericardial effusion. 2 D Echo ordered and completed Stat at bedside. Additional fluids ordered. PCCM to be consulted per Resident and possibly transfer pt to ICU tonight.   0145-DrZachery Conch. Friedman at bedside completed 2 D echo. HR remains 170-175 per cardiology to continue IV fluids Dr.Moding paged to update. Requested ICU transfer.  0215-Dr.Sood at [facility](PCCM) called. Per MD agrees with ICU bed change. Dr.Moding paged, MD to place order for transfer to ICU for closer monitoring  0250-[facility]  Event Summary: Name of Physician Notified: Dr. Glenard HaringModing at 0100  Dr.Sood at 0215   at           [facility], James IvanoffBrooke Leigh Drina Jobst

## 2014-04-25 NOTE — Progress Notes (Signed)
Patient continues to be in SVT rate 186. Dr. Craige CottaSood notified. Metoprolol 5 mg IV given. BP stable at this time.

## 2014-04-25 NOTE — Progress Notes (Signed)
Metoprolol effective. HR now 116, Afib. BP remains stable. Will monitor.

## 2014-04-25 NOTE — Progress Notes (Signed)
  Echocardiogram 2D Echocardiogram Limited has been performed.  Arvil ChacoFoster, Jacquelina Hewins 04/25/2014, 8:11 AM

## 2014-04-26 ENCOUNTER — Inpatient Hospital Stay (HOSPITAL_COMMUNITY): Payer: Medicare Other

## 2014-04-26 DIAGNOSIS — I4891 Unspecified atrial fibrillation: Secondary | ICD-10-CM

## 2014-04-26 LAB — COMPREHENSIVE METABOLIC PANEL
ALT: 6 U/L (ref 0–35)
ANION GAP: 12 (ref 5–15)
AST: 18 U/L (ref 0–37)
Albumin: 2 g/dL — ABNORMAL LOW (ref 3.5–5.2)
Alkaline Phosphatase: 65 U/L (ref 39–117)
BILIRUBIN TOTAL: 0.2 mg/dL — AB (ref 0.3–1.2)
BUN: 10 mg/dL (ref 6–23)
CO2: 19 mEq/L (ref 19–32)
Calcium: 9.3 mg/dL (ref 8.4–10.5)
Chloride: 107 mEq/L (ref 96–112)
Creatinine, Ser: 0.83 mg/dL (ref 0.50–1.10)
GFR calc non Af Amer: 61 mL/min — ABNORMAL LOW (ref >90)
GFR, EST AFRICAN AMERICAN: 71 mL/min — AB (ref >90)
GLUCOSE: 109 mg/dL — AB (ref 70–99)
Potassium: 3.9 mEq/L (ref 3.7–5.3)
Sodium: 138 mEq/L (ref 137–147)
TOTAL PROTEIN: 5.6 g/dL — AB (ref 6.0–8.3)

## 2014-04-26 LAB — GLUCOSE, CAPILLARY
Glucose-Capillary: 115 mg/dL — ABNORMAL HIGH (ref 70–99)
Glucose-Capillary: 141 mg/dL — ABNORMAL HIGH (ref 70–99)
Glucose-Capillary: 137 mg/dL — ABNORMAL HIGH (ref 70–99)
Glucose-Capillary: 126 mg/dL — ABNORMAL HIGH (ref 70–99)

## 2014-04-26 LAB — INFLUENZA PANEL BY PCR (TYPE A & B)
H1N1 flu by pcr: NOT DETECTED
INFLAPCR: NEGATIVE
Influenza B By PCR: NEGATIVE

## 2014-04-26 MED ORDER — AMIODARONE HCL 200 MG PO TABS
200.0000 mg | ORAL_TABLET | Freq: Two times a day (BID) | ORAL | Status: DC
  Administered 2014-04-26 – 2014-05-01 (×11): 200 mg via ORAL
  Filled 2014-04-26 (×13): qty 1

## 2014-04-26 MED ORDER — FUROSEMIDE 10 MG/ML IJ SOLN
40.0000 mg | Freq: Once | INTRAMUSCULAR | Status: AC
  Administered 2014-04-26: 40 mg via INTRAVENOUS
  Filled 2014-04-26: qty 4

## 2014-04-26 MED ORDER — LEVOFLOXACIN 750 MG PO TABS
750.0000 mg | ORAL_TABLET | ORAL | Status: DC
  Administered 2014-04-26: 750 mg via ORAL
  Filled 2014-04-26 (×2): qty 1

## 2014-04-26 NOTE — Progress Notes (Signed)
Patient ID: Wendy Vasquez, female   DOB: 15-Oct-1923, 80 y.o.   MRN: [id number] .   SUBJECTIVE:  23F with HTN, HLD, DM, prior bleeding ulcers, ? Parkinson's disease who is admitted to the hospital after failed treatment for pneumonia. Cardiology was consulted for new onset atrial fibrillation in the setting of spiking fevers.   CT 10/6 no PE. + ?infiltrate. Moderate pericardial effusion.   Started on amiodarone. Now back in NSR.   Echo: EF 55-60%. There is small-moderate circumferential effusion posterior > anterior. There is significant (>25%) respiratory variation in mitral inflow but this is in setting of AF with RVR so uncertain significance. No RV collapse. IVC is dilated.  I do not think this is tamponade.   This morning, awake, not communicative.  Remains in NSR.  Getting IV fluid. BP stable.   Scheduled Meds: . acidophilus  1 capsule Oral Daily  . amiodarone  200 mg Oral BID  . antiseptic oral rinse  7 mL Mouth Rinse BID  . arformoterol  15 mcg Nebulization BID  . aspirin EC  81 mg Oral Daily  . azithromycin  250 mg Intravenous Daily  . budesonide  0.5 mg Nebulization BID  . calcium-vitamin D  1 tablet Oral BID WC  . docusate sodium  200 mg Oral QHS  . gemfibrozil  600 mg Oral BID  . heparin  5,000 Units Subcutaneous 3 times per day  . insulin aspart  0-9 Units Subcutaneous TID WC  . metoCLOPramide  10 mg Oral Q breakfast  . multivitamin with minerals  1 tablet Oral Daily  . pantoprazole  40 mg Oral Daily  . piperacillin-tazobactam (ZOSYN)  IV  3.375 g Intravenous 3 times per day  . vancomycin  750 mg Intravenous Q24H  . vitamin B-12  1,000 mcg Oral Q breakfast   Continuous Infusions: . phenylephrine (NEO-SYNEPHRINE) Adult infusion Stopped (04/25/14 0625)   PRN Meds:.acetaminophen, albuterol, guaiFENesin, HYDROcodone-homatropine, naphazoline, polyethylene glycol    Filed Vitals:   04/26/14 0300 04/26/14 0400 04/26/14 0500 04/26/14 0600  BP: 94/53 89/54 95/35  116/47    Pulse: 110 111 67 83  Temp: 99.9 F (37.7 C) 99.7 F (37.6 C) 99.9 F (37.7 C) 99.5 F (37.5 C)  TempSrc:  Core (Comment)    Resp: 24 22 22 28   Height:      Weight:   143 lb 11.8 oz (65.2 kg)   SpO2: 100% 100% 100% 100%    Intake/Output Summary (Last 24 hours) at 04/26/14 0833 Last data filed at 04/26/14 0800  Gross per 24 hour  Intake 3109.1 ml  Output   2095 ml  Net 1014.1 ml    LABS: Basic Metabolic Panel:  Recent Labs  40/98/11 2206 04/25/14 0700 04/26/14 0340  NA 137 137 138  K 4.1 4.4 3.9  CL 103 106 107  CO2 22 19 19   GLUCOSE 147* 154* 109*  BUN 12 11 10   CREATININE 0.83 0.72 0.83  CALCIUM 9.2 9.2 9.3  MG 1.8 2.0  --    Liver Function Tests:  Recent Labs  04/24/14 2206 04/26/14 0340  AST 15 18  ALT 6 6  ALKPHOS 81 65  BILITOT 0.3 0.2*  PROT 6.4 5.6*  ALBUMIN 2.5* 2.0*   No results found for this basename: LIPASE, AMYLASE,  in the last 72 hours CBC:  Recent Labs  04/23/14 2047  04/25/14 0700 04/25/14 1530  WBC 13.5*  < > 13.5* 10.4  NEUTROABS 11.6*  --   --  7.8*  HGB 12.0  13.9  < > 9.9* 9.0*  HCT 36.5  41.0  < > 30.8* 27.5*  MCV 94.1  < > 94.5 92.9  PLT 383  < > 316 283  < > = values in this interval not displayed. Cardiac Enzymes:  Recent Labs  04/24/14 2206 04/25/14 1530  CKTOTAL  --  30  TROPONINI <0.30  --    BNP: No components found with this basename: POCBNP,  D-Dimer:  Recent Labs  04/24/14 2206  DDIMER 2.19*   Hemoglobin A1C:  Recent Labs  04/24/14 0545  HGBA1C 5.4   Fasting Lipid Panel: No results found for this basename: CHOL, HDL, LDLCALC, TRIG, CHOLHDL, LDLDIRECT,  in the last 72 hours Thyroid Function Tests:  Recent Labs  04/24/14 2206  TSH 1.890   Anemia Panel: No results found for this basename: VITAMINB12, FOLATE, FERRITIN, TIBC, IRON, RETICCTPCT,  in the last 72 hours  RADIOLOGY:  Ct Angio Chest Pe W/cm &/or Wo Cm  04/25/2014   CLINICAL DATA:  Shortness of breath.  Tachycardia.   Pneumonia.  EXAM: CT ANGIOGRAPHY CHEST WITH CONTRAST  TECHNIQUE: Multidetector CT imaging of the chest was performed using the standard protocol during bolus administration of intravenous contrast. Multiplanar CT image reconstructions and MIPs were obtained to evaluate the vascular anatomy.  CONTRAST:  100mL OMNIPAQUE IOHEXOL 350 MG/ML SOLN  COMPARISON:  Two-view chest x-ray 04/24/2014.  FINDINGS: Pulmonary arterial opacification is excellent. There are no focal filling defects to suggest pulmonary emboli. The study is mildly degraded by patient breathing motion. This limits evaluation of distal small vessels.  A moderate-sized pericardial effusion measures up to 19 mm. This appears to be low-density. The heart size is normal. Atherosclerotic calcifications are noted in the aortic arch.  Bilateral pleural effusions are present. Associated mild airspace disease is present. No significant upper lobe airspace disease is present.  Bone window is are unremarkable. Mild exaggerated kyphosis is present in the thoracic spine.  Review of the MIP images confirms the above findings.  IMPRESSION: 1. Moderate-sized pericardial effusion. Developing tamponade is considered. 2. No evidence for pulmonary embolus. 3. Small moderate bilateral pleural effusions with associated airspace disease, likely atelectasis. 4. Significant patient breathing motion limits evaluation of distal small vessels. Critical Value/emergent results were called by telephone at the time of interpretation on 04/25/2014 at 1:14 am to Dr. Luisa DagoEverett Moding, who verbally acknowledged these results.   Electronically Signed   By: Gennette Pachris  Mattern M.D.   On: 04/25/2014 01:14    PHYSICAL EXAM General: NAD Neck: JVP 8-9 cm, no thyromegaly or thyroid nodule.  Lungs: Decreased breath sounds at bases bilaterally.  CV: Nondisplaced PMI.  Heart regular S1/S2, no S3/S4, no murmur.  No peripheral edema.   Abdomen: Soft, nontender, no hepatosplenomegaly, no distention.    Neurologic: Alert but not communicative. Psych: Flat affect. Extremities: No clubbing or cyanosis.   TELEMETRY: Reviewed telemetry pt in NSR  ASSESSMENT AND PLAN: She has developed AF in setting of recent PNA/viral illness. Now back in NSR on amiodarone. Would continue for now, can change to po.   Echo showed small to moderate pericardial effusion posterior > anterior. There did not appear to be pericardial tamponade.  Would continue supportive care at this point. Agree with not anti-coagulating fully in setting of pericardial effusion.  Would consider repeating limited echo in a 2-3 days.   Would stop IV fluid.  Do not think tamponade is present and JVP elevated.   If not improving soon, would consider  re-addressing code status and aggressiveness of care   Marca Ancona 04/26/2014 8:38 AM

## 2014-04-26 NOTE — Progress Notes (Signed)
Physical Therapy Treatment Patient Details Name: Wendy Vasquez MRN: [id number] DOB: 17-Feb-1924 Today's Date: 04/26/2014    History of Present Illness Ms. Wendy Vasquez is an 80 y.o. female with with past medical history of hypertension, asthma, hyperlipidemia, and generalized deconditioning, gastric ulcer, who presents with cough, chest pain and fever    PT Comments    Pt progressing slowly towards physical therapy goals. Pt was able to tolerate squat-pivot transfer to recliner chair with +2 total assist. Increased cueing for hand placement and general safety awareness. Pt attempting to push call bell per therapist's request, and unable due to tremors. Spoke with RN who ordered a soft-touch call bell for pt. Will continue to follow and progress as able per POC.   Follow Up Recommendations  SNF;Supervision/Assistance - 24 hour     Equipment Recommendations  Hospital bed;Other (comment) Michiel Sites(Hoyer lift)    Recommendations for Other Services       Precautions / Restrictions Precautions Precautions: Fall Precaution Comments: total assist Restrictions Weight Bearing Restrictions: No    Mobility  Bed Mobility Overal bed mobility: Needs Assistance Bed Mobility: Supine to Sit     Supine to sit: Total assist;+2 for physical assistance     General bed mobility comments: Assist for movement of LE's to EOB as well as for trunk elevation to full sitting position. Pt was able to move RLE more than LLE however still needed extensive assist with the R side.   Transfers Overall transfer level: Needs assistance Equipment used: 2 person hand held assist Transfers: Sit to/from [employer] CorporationStand;Squat Pivot Transfers Sit to Stand: Total assist;+2 physical assistance;+2 safety/equipment   Squat pivot transfers: Total assist;+2 physical assistance;+2 safety/equipment     General transfer comment: Pt able to hold onto therapist's arm with RUE, and kept LUE in a flexed posture against her side. Bed pad used to  provide increased support for hips during transfer.   Ambulation/Gait             General Gait Details: non ambulatory at baseline   Stairs            Wheelchair Mobility    Modified Rankin (Stroke Patients Only)       Balance Overall balance assessment: Needs assistance Sitting-balance support: Feet supported;Single extremity supported Sitting balance-Leahy Scale: Zero Sitting balance - Comments: Requires mod to max assist at all times to maintain upright seated posture.  Postural control: Posterior lean                          Cognition Arousal/Alertness: Awake/alert Behavior During Therapy: Flat affect Overall Cognitive Status: Difficult to assess                      Exercises      General Comments        Pertinent Vitals/Pain Pain Assessment: No/denies pain    Home Living                      Prior Function            PT Goals (current goals can now be found in the care plan section) Acute Rehab PT Goals Patient Stated Goal: to get strength back PT Goal Formulation: With patient Time For Goal Achievement: 05/08/14 Potential to Achieve Goals: Fair Progress towards PT goals: Progressing toward goals    Frequency  Min 2X/week    PT Plan Current plan remains appropriate    Co-evaluation  End of Session Equipment Utilized During Treatment: Gait belt Activity Tolerance: Patient limited by fatigue Patient left: in chair;with call bell/phone within reach     Time: 1610-9604 PT Time Calculation (min): 23 min  Charges:  $Therapeutic Activity: 23-37 mins                    G Codes:      Conni Slipper 2014/05/22, 10:57 AM  Conni Slipper, PT, DPT Acute Rehabilitation Services Pager: [phone]

## 2014-04-26 NOTE — Progress Notes (Signed)
PULMONARY / CRITICAL CARE MEDICINE   Name: Wendy Vasquez MRN: [id number] DOB: [birth year]    ADMISSION DATE:  04/23/2014 CONSULTATION DATE:  04/26/2014  REFERRING MD :  Cyndie Chime  CHIEF COMPLAINT:  New A-fib with RVR, Fever, Hypotension  INITIAL PRESENTATION: 79 y.o. F admitted 10/5 with sepsis secondary to presumed HCAP.  On evening of 10/6, RRT was called for tachycardia, fever, and hypotension.  Pt was found to have new A-fib with RVR.  BP responded somewhat to fluids; however, request was made for transfer to ICU.  PCCM was consulted.   STUDIES:  10/5 CT Head  >>> no acute intracranial pathology, 1.1 cm hyperdense colloid cyst at roof of 3rd ventricle. 10/6 EKG >>> AFib with RVR. 10/7 CTA chest  >>> moderate sized pericardial effusion, developing tamponade is considered.  No evidence of PE, small b/l pleural effusions. 10/6 Bedside echo (Dr. Zachery Conch - cards) >>> small pericardial effusion, no tamponade physiology. 10/7 echo>> EF 55-60%, no evidence tamponade  SIGNIFICANT EVENTS: 10/5 - admitted with HCAP / sepsis 10/6 - RRT called for new onset A.fib, fever, hypotension.  PCCM consulted for transfer to ICU. 10/8- off amio IV, in chair, BP wnl   SUBJECTIVE: comfortable in chair, no discomfort  Filed Vitals:   04/26/14 1000  BP: 125/61  Pulse: 87  Temp: 99 F (37.2 C)  Resp: 24    HEMODYNAMICS:   I/O last 3 completed shifts: In: 7913.6 [I.V.:6138.6; IV Piggyback:1775] Out: 2510 [Urine:2510] Total I/O In: 26.4 [I.V.:26.4] Out: 60 [Urine:60]    Stool Occurrence 1 x      PHYSICAL EXAMINATION: General: Elderly chronically ill appearing female sitting in chair, in NAD. Neuro:  Able to express wishes,Follows some commands.  No focal deficits. Tremor noted R hand HEENT: No JVD. Cardiovascular: NSR, no M/R/G.  Lungs: Respirations even and unlabored.  CTA bilaterally Abdomen: BS x 4, soft, NT/ND.  Musculoskeletal: No gross deformities, no edema.  Skin: Intact, dry,  no rashes.    LABS:  Al llabs reviewed  ASSESSMENT / PLAN:  PULMONARY A: Acute hypoxemic respiratory failure HCAP -Aspiration more likely with parkinsons R/o URI / viral process / CAP Asthma Small b/l pleural effusions Worsening pcxr? P:   Supplemental O2 as needed to maintain SpO2 > 92%. Cultures / Abx as per ID section. D/c dulera, change to brovana / budesonide for now. Continue albuterol. Follow up pcxr in am  IS when able Consider lasix Repeat films now and in am   CARDIOVASCULAR A. Line 10/7 >>> A:  A.Fib with RVR - pt appears clinically dry, this plus  Pericardial effusion - Per Dr. Zachery Conch (cards), bedside echo did NOT reveal tamponade physiology. Hx HTN - now hypotensive Hx HLD P:  Off amio drip amio oral per cards Lasix addition required  RENAL A:   At risk ATN P:   NS @ 100, kvio BMP in AM. lasix  GASTROINTESTINAL A:   GI prophylaxis Nutrition P:   SLP recommends regular diet with thin liquids, hand feeding by staff Advance diet as tolerated  HEMATOLOGIC A:   VTE Prophylaxis Hx of prior bleeding ulcers Dilutional anemia P:  SCD's / Heparin. lasix CBC in AM. Transfuse per usual ICU guidelines.  INFECTIOUS A:   Sepsis - in setting of presumed HCAP Highly likely aspiration P:   BCx2 10/5 >>> UCx 10/5 >>> no growth Sputum Cx 10/5 >>> RVP 10/6 >>> Mycoplasma Ab. 10/6 >>> U. Legionella 10/6 >>> Abx: Azithro, start date 10/6>>10/8 Abx: Vanc, start date  10/5>>10/8 Abx: Zosyn, start date 10/5>>10/8 Levofloxacin 10/8>>>stop 10th  PCT was low, low clinical suspicion PNA  ENDOCRINE A:   DM P:   CBG's q4hr. SSI.  NEUROLOGIC A:   Acute metabolic / toxic encephalopathy  Anxiety parkinsons dz, at risk NMS (see NO home meds for parkinsonism and duaghter denies) Baseline per daughter P:   Monitor clinically. cpk neg Active PT No antipsychotics, no haldol etc  will need outpt neuro evaluation  TODAY'S SUMMARY: off amio,  in chair, to sdu, to transfer off pccm service   04/26/2014, 10:23 AM   I have personally obtained a history, examined the patient, evaluated laboratory and imaging results, formulated the assessment and plan and placed orders.  Mcarthur Rossettianiel J. Tyson AliasFeinstein, MD, FACP Pgr: [phone] [facility]

## 2014-04-26 NOTE — Care Management Note (Addendum)
    Page 1 of 1   05/01/2014     4:16:07 PM CARE MANAGEMENT NOTE 05/01/2014  Patient:  Leander RamsHICKS,Ashleyanne K   Account Number:  [id number]  Date Initiated:  04/26/2014  Documentation initiated by:  Junius CreamerWELL,DEBBIE  Subjective/Objective Assessment:   adm w pneumonia     Action/Plan:   lives w fam, pcp dr Andrey Campanilewilson   Anticipated DC Date:  05/01/2014   Anticipated DC Plan:  SKILLED NURSING FACILITY  In-house referral  Clinical Social Worker         Choice offered to / List presented to:             Status of service:  Completed, signed off Medicare Important Message given?  YES (If response is "NO", the following Medicare IM given date fields will be blank) Date Medicare IM given:  04/26/2014 Medicare IM given by:  Junius CreamerWELL,DEBBIE Date Additional Medicare IM given:  04/30/2014 Additional Medicare IM given by:  Gilverto Dileonardo  Discharge Disposition:  SKILLED NURSING FACILITY  Per UR Regulation:  Reviewed for med. necessity/level of care/duration of stay  If discussed at Long Length of Stay Meetings, dates discussed:   05/01/2014    Comments:  05/01/14 Sidney AceJulie Julen Rubert, RN, BSN [phone] Pt discharged to SNF today, per CSW arrangements.

## 2014-04-26 NOTE — Progress Notes (Signed)
Patient ID: Leander [name], female   DOB: 02-Apr-1924, 80 y.o.   MRN: [id number] Medicine attending: We appreciate assistance from the critical care team. No acute clinical changes. She has reverted to normal sinus rhythm. She is still poorly interactive. Significant rigidity and cogwheeling and I agree with Dr. Tyson AliasFeinstein Parkinson's disease is suspect. No further temperature spikes. Blood pressure stable. Oxygen saturation 100%. Bacterial and viral cultures negative so far. Urine Legionella antigen negative. Mycoplasma serology pending. Urine culture no growth. I would favor stopping vancomycin and Zosyn at this time. Continuing single agent azithromycin. I would be interested in a neurology consult and recommendations.  Cephas DarbyJames Taevyn Hausen M.D., FACP

## 2014-04-27 ENCOUNTER — Inpatient Hospital Stay (HOSPITAL_COMMUNITY): Payer: Medicare Other

## 2014-04-27 DIAGNOSIS — I313 Pericardial effusion (noninflammatory): Secondary | ICD-10-CM | POA: Diagnosis not present

## 2014-04-27 DIAGNOSIS — I4891 Unspecified atrial fibrillation: Secondary | ICD-10-CM | POA: Diagnosis not present

## 2014-04-27 DIAGNOSIS — I3139 Other pericardial effusion (noninflammatory): Secondary | ICD-10-CM | POA: Diagnosis not present

## 2014-04-27 DIAGNOSIS — J80 Acute respiratory distress syndrome: Secondary | ICD-10-CM

## 2014-04-27 DIAGNOSIS — J129 Viral pneumonia, unspecified: Secondary | ICD-10-CM

## 2014-04-27 DIAGNOSIS — R269 Unspecified abnormalities of gait and mobility: Secondary | ICD-10-CM

## 2014-04-27 LAB — GLUCOSE, CAPILLARY
GLUCOSE-CAPILLARY: 95 mg/dL (ref 70–99)
GLUCOSE-CAPILLARY: 120 mg/dL — AB (ref 70–99)
GLUCOSE-CAPILLARY: 113 mg/dL — AB (ref 70–99)
Glucose-Capillary: 124 mg/dL — ABNORMAL HIGH (ref 70–99)

## 2014-04-27 LAB — BASIC METABOLIC PANEL
Anion gap: 18 — ABNORMAL HIGH (ref 5–15)
BUN: 10 mg/dL (ref 6–23)
CHLORIDE: 101 meq/L (ref 96–112)
CO2: 21 mEq/L (ref 19–32)
CREATININE: 0.94 mg/dL (ref 0.50–1.10)
Calcium: 9.5 mg/dL (ref 8.4–10.5)
GFR calc Af Amer: 61 mL/min — ABNORMAL LOW (ref >90)
GFR calc non Af Amer: 53 mL/min — ABNORMAL LOW (ref >90)
GLUCOSE: 92 mg/dL (ref 70–99)
POTASSIUM: 3.3 meq/L — AB (ref 3.7–5.3)
Sodium: 140 mEq/L (ref 137–147)

## 2014-04-27 LAB — RESPIRATORY VIRUS PANEL
Adenovirus: NOT DETECTED
INFLUENZA A H1: NOT DETECTED
INFLUENZA A: NOT DETECTED
INFLUENZA B 1: NOT DETECTED
Influenza A H3: NOT DETECTED
METAPNEUMOVIRUS: NOT DETECTED
PARAINFLUENZA 2 A: NOT DETECTED
Parainfluenza 1: NOT DETECTED
Parainfluenza 3: NOT DETECTED
Respiratory Syncytial Virus A: NOT DETECTED
Respiratory Syncytial Virus B: NOT DETECTED
Rhinovirus: NOT DETECTED

## 2014-04-27 LAB — MYCOPLASMA PNEUMONIAE ANTIBODY, IGM: MYCOPLASMA PNEUMO IGM: 167 U/mL (ref <770)

## 2014-04-27 MED ORDER — FUROSEMIDE 10 MG/ML IJ SOLN
40.0000 mg | Freq: Once | INTRAMUSCULAR | Status: AC
  Administered 2014-04-27: 40 mg via INTRAVENOUS
  Filled 2014-04-27: qty 4

## 2014-04-27 MED ORDER — POTASSIUM CHLORIDE CRYS ER 20 MEQ PO TBCR: 40.0000 meq | EXTENDED_RELEASE_TABLET | Freq: Two times a day (BID) | ORAL | Status: DC

## 2014-04-27 MED ORDER — POTASSIUM CHLORIDE CRYS ER 20 MEQ PO TBCR
40.0000 meq | EXTENDED_RELEASE_TABLET | Freq: Two times a day (BID) | ORAL | Status: DC
  Administered 2014-04-27 – 2014-05-01 (×9): 40 meq via ORAL
  Filled 2014-04-27 (×10): qty 2

## 2014-04-27 MED ORDER — POTASSIUM CHLORIDE CRYS ER 20 MEQ PO TBCR
40.0000 meq | EXTENDED_RELEASE_TABLET | Freq: Once | ORAL | Status: DC
  Filled 2014-04-27: qty 2

## 2014-04-27 NOTE — Progress Notes (Signed)
Patient ID: Wendy RamsRuth K Santosuosso, female   DOB: 1924/05/31, 80 y.o.   MRN: [id number] Medicine attending progress note: I personally examined this patient today and reviewed her status with the medical resident team. She is showing some signs of stabilization. She is back in normal sinus rhythm now off of parenteral amiodarone and started on oral amiodarone. Maximum temperature 99.1. Day #4 parenteral antibiotics. All antibacterial cultures are negative. She is more alert but still difficult to interact with. I note left pupil 2 mm greater than the right both reactive. She does admit that she has had cataract surgery in the past. She has a resting tremor right hand. Left hand is rigid. Left upper extremity rigid. Positive cogwheeling. She still cannot cooperate with a motor exam of her upper or lower extremities. Followup chest radiograph with borderline cardiomegaly secondary to known pericardial effusion. Small bilateral pleural effusions which are improving. No pulmonary infiltrates. White blood count continues to trend down towards baseline. Impression  #1. Viral pneumonitis I don't think that she aspirated. I'm going to stop all peripheral antibacterials at this time. #2. Acute nature fibrillation related to acute infection now back in sinus rhythm. #3. Pericardial effusion. Likely related to her viral pneumonitis Plan followup echocardiogram early next week.. #4. Spectrum of neurologic findings highly suggestive of Parkinson's disease. We will ask neurology for an opinion and advice on medication.  She is stable enough to transfer to a telemetry bed.  Cephas DarbyJames Granfortuna, M.D., FACP

## 2014-04-27 NOTE — Progress Notes (Signed)
NUTRITION FOLLOW-UP  INTERVENTION: Supplement diet as needed.   NUTRITION DIAGNOSIS: Inadequate oral intake related to inability to eat as evidenced by NPO status; resolved.   Goal: Pt to meet >/= 90% of their estimated nutrition needs, met.    Monitor:  PO intake, weight trends, labs  ASSESSMENT: Pt admitted to hospital after failed outpatient treatment for PNA. Pt is unable to provide any history or answer questions.  PO intake is > 80% of meals. Pt is fed by staff.  Potassium low on supplementation.  MD considering new dx of Parkinson's.   Height: Ht Readings from Last 1 Encounters:  04/25/14 $RemoveB'5\' 7"'nTCXRQbg$  (1.702 m)    Weight: Wt Readings from Last 1 Encounters:  04/27/14 138 lb 7.2 oz (62.8 kg)    BMI:  Body mass index is 21.68 kg/(m^2).  Estimated Nutritional Needs: Kcal: 1400-1600 Protein: 70-85 grams Fluid: > 1.5 L/day  Skin: ecchymosis  Diet Order: Carb Control Meal Completion: > 80%   Intake/Output Summary (Last 24 hours) at 04/27/14 1149 Last data filed at 04/27/14 0500  Gross per 24 hour  Intake    600 ml  Output   3140 ml  Net  -2540 ml    Last BM: 10/7   Labs:   Recent Labs Lab 04/24/14 0545 04/24/14 2206 04/25/14 0700 04/26/14 0340 04/27/14 0300  NA 139 137 137 138 140  K 4.1 4.1 4.4 3.9 3.3*  CL 106 103 106 107 101  CO2 $Re'20 22 19 19 21  'Vxg$ BUN $R'11 12 11 10 10  'ey$ CREATININE 0.68 0.83 0.72 0.83 0.94  CALCIUM 9.6 9.2 9.2 9.3 9.5  MG  --  1.8 2.0  --   --   GLUCOSE 116* 147* 154* 109* 92    CBG (last 3)   Recent Labs  04/26/14 1636 04/26/14 2123 04/27/14 0748  GLUCAP 137* 126* 95    Scheduled Meds: . acidophilus  1 capsule Oral Daily  . amiodarone  200 mg Oral BID  . antiseptic oral rinse  7 mL Mouth Rinse BID  . arformoterol  15 mcg Nebulization BID  . aspirin EC  81 mg Oral Daily  . budesonide  0.5 mg Nebulization BID  . calcium-vitamin D  1 tablet Oral BID WC  . docusate sodium  200 mg Oral QHS  . furosemide  40 mg  Intravenous Once  . gemfibrozil  600 mg Oral BID  . heparin  5,000 Units Subcutaneous 3 times per day  . insulin aspart  0-9 Units Subcutaneous TID WC  . levofloxacin  750 mg Oral Q48H  . metoCLOPramide  10 mg Oral Q breakfast  . multivitamin with minerals  1 tablet Oral Daily  . pantoprazole  40 mg Oral Daily  . potassium chloride  40 mEq Oral BID  . potassium chloride  40 mEq Oral Once  . vitamin B-12  1,000 mcg Oral Q breakfast    Continuous Infusions:     Maylon Peppers RD, LDN, CNSC [phone] Pager [phone] After Hours Pager

## 2014-04-27 NOTE — Consult Note (Addendum)
NEURO HOSPITALIST CONSULT NOTE    Reason for Consult: parkinson's disease and AMS  HPI:                                                                                                                                          Wendy Vasquez is an 80 y.o. female who was recently treated in hospital for PNA. She was discharged home but symptoms never fully resolved.  Patient was readmitted due to increased cough, CP and fever. On admission found to have a  WBC 13.5 and temp of 100.0.  While hospitalized patein had increase in temperature 101.9 and showed Afib on monitor. Further evaluation noted increased D Dimer and low BP.  Cardiology was involved and did Echo showing small pericardial effusion. It is believed effusion likely secondary to possible viral infection and also driving the Afib. Bacterial and viral cultures negative so far. Urine Legionella antigen negative. Mycoplasma serology pending. Urine culture no growth. Currently patient is afebrile and back to NSR.  Primary team has noted increased rigidity and cogwheeling and asked neurology for input on advice for medications.    In talking with daughter she states Wendy Vasquez has not been mobile for 2+ years.  She is wheelchair bound and daughter, baths her, feds her and with two person assist with stand and pivot patient from chair to chair.  She has had a resting tremor for >3 years. Over the past few years she has become increasingly stiff due to immobility and over the past month this has become worse to to er illness. As far as mentation, she has a good long term memory but short term memory has declined.   Past Medical History  Diagnosis Date  . Hypertension   . HLD (hyperlipidemia)   . Bleeding ulcer     requiring transfusions  . H/O hiatal hernia   . History of blood transfusion     "related to bleeding ulcers"  . Asthma     "a touch"  . Pneumonia X 1  . Diabetes mellitus without complication     "diet  controlled"  . GERD (gastroesophageal reflux disease)   . Migraine     hx  . Daily headache     "last couple days" (04/24/2014)  . Arthritis     "all over"    Past Surgical History  Procedure Laterality Date  . Total knee arthroplasty Bilateral   . Joint replacement    . Cataract extraction  "years ago"    "don't know which side; don't know if they put lens in"  . Dilation and curettage of uterus      "after she lost a child, I'd assume"    Family History  Problem Relation Age of Onset  . Colon cancer Mother  Social History:  reports that she has quit smoking. Her smoking use included Cigarettes. She smoked 0.00 packs per day. She has never used smokeless tobacco. She reports that she does not drink alcohol or use illicit drugs.  Allergies  Allergen Reactions  . Ciprofloxacin Hives  . Sulphur [Sulfur] Anaphylaxis    MEDICATIONS:                                                                                                                     Prior to Admission:  Prescriptions prior to admission  Medication Sig Dispense Refill  . acetaminophen (TYLENOL) 500 MG tablet Take 1,000 mg by mouth 4 (four) times daily. Take everyday per daughter      . ADVAIR DISKUS 250-50 MCG/DOSE AEPB Inhale 1 puff into the lungs 2 (two) times daily.       Marland Kitchen ALPRAZolam (XANAX) 0.25 MG tablet Take 0.5 tablets by mouth at bedtime.       Marland Kitchen amoxicillin-clavulanate (AUGMENTIN) 500-125 MG per tablet Take 1 tablet by mouth 2 (two) times daily. Started medication on 04-14-14      . aspirin 81 MG tablet Take 81 mg by mouth daily.      . calcium-vitamin D (OSCAL WITH D) 500-200 MG-UNIT per tablet Take 1 tablet by mouth 2 (two) times daily.      . Cranberry 1000 MG CAPS Take 2,000 mg by mouth daily with breakfast.       . docusate sodium (COLACE) 100 MG capsule Take 200 mg by mouth at bedtime.      Marland Kitchen esomeprazole (NEXIUM) 40 MG capsule Take 40 mg by mouth at bedtime.       Marland Kitchen gemfibrozil (LOPID) 600 MG  tablet Take 600 mg by mouth 2 (two) times daily.       Marland Kitchen guaiFENesin (MUCINEX) 600 MG 12 hr tablet Take 600 mg by mouth 2 (two) times daily as needed for cough or to loosen phlegm.      Marland Kitchen HYDROcodone-homatropine (HYCODAN) 5-1.5 MG/5ML syrup Take 5 mLs by mouth every 6 (six) hours as needed for cough.      . metoCLOPramide (REGLAN) 10 MG tablet Take 10 mg by mouth daily with breakfast.       . Multiple Vitamin (MULTIVITAMIN WITH MINERALS) TABS tablet Take 1 tablet by mouth daily.      . polyethylene glycol (MIRALAX / GLYCOLAX) packet Take 17 g by mouth daily as needed for mild constipation.      . Probiotic Product (PROBIOTIC PO) Take 1 capsule by mouth daily.      Marland Kitchen tetrahydrozoline (VISINE) 0.05 % ophthalmic solution Place 1 drop into both eyes daily as needed (for dry eyes).      . vitamin B-12 (CYANOCOBALAMIN) 1000 MCG tablet Take 1,000 mcg by mouth daily with breakfast.        Scheduled: . acidophilus  1 capsule Oral Daily  . amiodarone  200 mg Oral BID  . antiseptic oral rinse  7 mL Mouth Rinse BID  .  arformoterol  15 mcg Nebulization BID  . aspirin EC  81 mg Oral Daily  . budesonide  0.5 mg Nebulization BID  . calcium-vitamin D  1 tablet Oral BID WC  . docusate sodium  200 mg Oral QHS  . furosemide  40 mg Intravenous Once  . gemfibrozil  600 mg Oral BID  . heparin  5,000 Units Subcutaneous 3 times per day  . insulin aspart  0-9 Units Subcutaneous TID WC  . levofloxacin  750 mg Oral Q48H  . metoCLOPramide  10 mg Oral Q breakfast  . multivitamin with minerals  1 tablet Oral Daily  . pantoprazole  40 mg Oral Daily  . potassium chloride  40 mEq Oral BID  . potassium chloride  40 mEq Oral Once  . vitamin B-12  1,000 mcg Oral Q breakfast     ROS:                                                                                                                                       History obtained from unobtainable from patient due to mental status and minimal vocalization  General  ROS: negative for - chills, fatigue, fever, night sweats, weight gain or weight loss Psychological ROS: negative for - behavioral disorder, hallucinations, memory difficulties, mood swings or suicidal ideation Ophthalmic ROS: negative for - blurry vision, double vision, eye pain or loss of vision ENT ROS: negative for - epistaxis, nasal discharge, oral lesions, sore throat, tinnitus or vertigo Allergy and Immunology ROS: negative for - hives or itchy/watery eyes Hematological and Lymphatic ROS: negative for - bleeding problems, bruising or swollen lymph nodes Endocrine ROS: negative for - galactorrhea, hair pattern changes, polydipsia/polyuria or temperature intolerance Respiratory ROS: negative for - cough, hemoptysis, shortness of breath or wheezing Cardiovascular ROS: negative for - chest pain, dyspnea on exertion, edema or irregular heartbeat Gastrointestinal ROS: negative for - abdominal pain, diarrhea, hematemesis, nausea/vomiting or stool incontinence Genito-Urinary ROS: negative for - dysuria, hematuria, incontinence or urinary frequency/urgency Musculoskeletal ROS: negative for - joint swelling or muscular weakness Neurological ROS: as noted in HPI Dermatological ROS: negative for rash and skin lesion changes   Blood pressure 117/51, pulse 110, temperature 99 F (37.2 C), temperature source Oral, resp. rate 24, height $RemoveBe'5\' 7"'WOlibSbPR$  (1.702 m), weight 62.8 kg (138 lb 7.2 oz), SpO2 96.00%.   Neurologic Examination:  General: NAD Mental Status: Alert, able to follow very simple commands such as smiling, following my finger, blinking and counting fingers. Speech clear but when talking speaks very low.  She states she is in the hospital but gets year and month wrong.  Cranial Nerves: II: Discs flat bilaterally; Visual fields grossly normal and able to count fingers in all quadrants, pupils round,  reactive to light and accommodation--left is 35mm larger than right III,IV, VI: ptosis not present, extra-ocular motions intact bilaterally V,VII: smile symmetric, facial light touch sensation normal bilaterally VIII: hearing normal bilaterally IX,X: gag reflex present XI: bilateral shoulder shrug XII: midline tongue extension without atrophy or fasciculations  Motor: Bilateral arms have increased tone and held in flexion across abdomen.  At rest no tremor noted.  With agitation or on moving the right arm, tremor noted in the RUE.  Bilateral leg have increased tone and held in extension.  Sensory: Pinprick and light touch intact throughout, bilaterally--withdraws briskly from noxious stimuli bilaterally Deep Tendon Reflexes:  1+ bilateral UE no KJ or AJ noted.   Plantars: Right: downgoing   Left: downgoing Cerebellar: Unable to assess Gait: unable to assess CV: pulses palpable throughout    Lab Results: Basic Metabolic Panel:  Recent Labs Lab 04/24/14 0545 04/24/14 2206 04/25/14 0700 04/26/14 0340 04/27/14 0300  NA 139 137 137 138 140  K 4.1 4.1 4.4 3.9 3.3*  CL 106 103 106 107 101  CO2 $Re'20 22 19 19 21  'nQO$ GLUCOSE 116* 147* 154* 109* 92  BUN $Re'11 12 11 10 10  'jkb$ CREATININE 0.68 0.83 0.72 0.83 0.94  CALCIUM 9.6 9.2 9.2 9.3 9.5  MG  --  1.8 2.0  --   --     Liver Function Tests:  Recent Labs Lab 04/23/14 2047 04/24/14 2206 04/26/14 0340  AST $Re'20 15 18  'zQy$ ALT $R'6 6 6  'oc$ ALKPHOS 113 81 65  BILITOT 0.4 0.3 0.2*  PROT 7.5 6.4 5.6*  ALBUMIN 3.1* 2.5* 2.0*   No results found for this basename: LIPASE, AMYLASE,  in the last 168 hours No results found for this basename: AMMONIA,  in the last 168 hours  CBC:  Recent Labs Lab 04/23/14 2047 04/24/14 0545 04/25/14 0700 04/25/14 1530  WBC 13.5* 9.1 13.5* 10.4  NEUTROABS 11.6*  --   --  7.8*  HGB 12.0  13.9 9.9* 9.9* 9.0*  HCT 36.5  41.0 30.7* 30.8* 27.5*  MCV 94.1 96.8 94.5 92.9  PLT 383 332 316 283    Cardiac  Enzymes:  Recent Labs Lab 04/24/14 2206 04/25/14 1530  CKTOTAL  --  30  TROPONINI <0.30  --     Lipid Panel: No results found for this basename: CHOL, TRIG, HDL, CHOLHDL, VLDL, LDLCALC,  in the last 168 hours  CBG:  Recent Labs Lab 04/26/14 1224 04/26/14 1636 04/26/14 2123 04/27/14 0748 04/27/14 1151  GLUCAP 141* 137* 126* 95 124*    Microbiology: Results for orders placed during the hospital encounter of 04/23/14  CULTURE, BLOOD (ROUTINE X 2)     Status: None   Collection Time    04/23/14  8:42 PM      Result Value Ref Range Status   Specimen Description BLOOD RIGHT FOREARM   Final   Special Requests BOTTLES DRAWN AEROBIC AND ANAEROBIC 5CC   Final   Culture  Setup Time     Final   Value: 04/24/2014 01:58     Performed at [facility]  Final   Value:        BLOOD CULTURE RECEIVED NO GROWTH TO DATE CULTURE WILL BE HELD FOR 5 DAYS BEFORE ISSUING A FINAL NEGATIVE REPORT     Performed at [facility]   Report Status PENDING   Incomplete  CULTURE, BLOOD (ROUTINE X 2)     Status: None   Collection Time    04/23/14  8:49 PM      Result Value Ref Range Status   Specimen Description BLOOD HAND RIGHT   Final   Special Requests BOTTLES DRAWN AEROBIC AND ANAEROBIC 5CCBLUE 3CCRED   Final   Culture  Setup Time     Final   Value: 04/24/2014 02:04     Performed at [facility]   Culture     Final   Value:        BLOOD CULTURE RECEIVED NO GROWTH TO DATE CULTURE WILL BE HELD FOR 5 DAYS BEFORE ISSUING A FINAL NEGATIVE REPORT     Performed at [facility]   Report Status PENDING   Incomplete  URINE CULTURE     Status: None   Collection Time    04/23/14  8:53 PM      Result Value Ref Range Status   Specimen Description URINE, CATHETERIZED   Final   Special Requests NONE   Final   Culture  Setup Time     Final   Value: 04/24/2014 08:59     Performed at [facility]     Final   Value: NO GROWTH     Performed  at [facility]   Culture     Final   Value: NO GROWTH     Performed at [facility]   Report Status 04/25/2014 FINAL   Final  RESPIRATORY VIRUS PANEL     Status: None   Collection Time    04/24/14  2:35 PM      Result Value Ref Range Status   Source - RVPAN NASAL SWAB   Corrected   Comment: CORRECTED ON 10/09 AT 1144: PREVIOUSLY REPORTED AS NASAL SWAB   Respiratory Syncytial Virus A NOT DETECTED   Final   Respiratory Syncytial Virus B NOT DETECTED   Final   Influenza A NOT DETECTED   Final   Influenza B NOT DETECTED   Final   Parainfluenza 1 NOT DETECTED   Final   Parainfluenza 2 NOT DETECTED   Final   Parainfluenza 3 NOT DETECTED   Final   Metapneumovirus NOT DETECTED   Final   Rhinovirus NOT DETECTED   Final   Adenovirus NOT DETECTED   Final   Influenza A H1 NOT DETECTED   Final   Influenza A H3 NOT DETECTED   Final   Comment: (NOTE)           Normal Reference Range for each Analyte: NOT DETECTED     Testing performed using the Luminex xTAG Respiratory Viral Panel test     kit.     The analytical performance characteristics of this assay have been     determined by [facility].  The modifications have not been     cleared or approved by the FDA. This assay has been validated pursuant     to the CLIA regulations and is used for clinical purposes.     Performed at [facility], BLOOD (ROUTINE X 2)     Status: None   Collection  Time    04/24/14 10:06 PM      Result Value Ref Range Status   Specimen Description BLOOD LEFT ARM   Final   Special Requests BOTTLES DRAWN AEROBIC ONLY 10CC   Final   Culture  Setup Time     Final   Value: 04/25/2014 01:53     Performed at [facility]   Culture     Final   Value:        BLOOD CULTURE RECEIVED NO GROWTH TO DATE CULTURE WILL BE HELD FOR 5 DAYS BEFORE ISSUING A FINAL NEGATIVE REPORT     Performed at [facility]   Report Status PENDING   Incomplete  CULTURE, BLOOD (ROUTINE X  2)     Status: None   Collection Time    04/24/14 10:15 PM      Result Value Ref Range Status   Specimen Description BLOOD LEFT FOREARM   Final   Special Requests     Final   Value: BOTTLES DRAWN AEROBIC AND ANAEROBIC 10CC AER,5CC ANA   Culture  Setup Time     Final   Value: 04/25/2014 01:52     Performed at [facility]   Culture     Final   Value:        BLOOD CULTURE RECEIVED NO GROWTH TO DATE CULTURE WILL BE HELD FOR 5 DAYS BEFORE ISSUING A FINAL NEGATIVE REPORT     Performed at [facility]   Report Status PENDING   Incomplete  MRSA PCR SCREENING     Status: None   Collection Time    04/25/14  3:00 AM      Result Value Ref Range Status   MRSA by PCR NEGATIVE  NEGATIVE Final   Comment:            The GeneXpert MRSA Assay (FDA     approved for NASAL specimens     only), is one component of a     comprehensive MRSA colonization     surveillance program. It is not     intended to diagnose MRSA     infection nor to guide or     monitor treatment for     MRSA infections.    Coagulation Studies: No results found for this basename: LABPROT, INR,  in the last 72 hours  Imaging: Dg Chest Port 1 View  04/27/2014   CLINICAL DATA:  Pneumonia.  EXAM: PORTABLE CHEST - 1 VIEW  COMPARISON:  04/26/2014 and 04/25/2014  FINDINGS: Bilateral pleural effusions have diminished. Pulmonary vascularity is normal. Cardiac silhouette is slightly prominent but the patient has a pericardial effusion as demonstrated on CT scan of 04/25/2014. No discrete infiltrates or pulmonary edema.  IMPRESSION: Decreasing bilateral pleural effusions.   Electronically Signed   By: Rozetta Nunnery M.D.   On: 04/27/2014 07:54   Dg Chest Port 1 View  04/26/2014   CLINICAL DATA:  Pneumonia  EXAM: PORTABLE CHEST - 1 VIEW  COMPARISON:  04/25/2014  FINDINGS: Cardiomegaly. Again noted small bilateral pleural effusion with bilateral basilar atelectasis or infiltrate. No convincing pulmonary edema.  IMPRESSION:  Cardiomegaly. Again noted small bilateral pleural effusion with bilateral basilar atelectasis or infiltrate. No convincing pulmonary edema.   Electronically Signed   By: Lahoma Crocker M.D.   On: 04/26/2014 13:12   Dg Chest Port 1 View  04/25/2014   CLINICAL DATA:  Evaluate pneumonia  EXAM: PORTABLE CHEST - 1 VIEW  COMPARISON:  04/25/2014  FINDINGS:  Cardiomegaly is noted. No convincing pulmonary edema. Bilateral small pleural effusion with bilateral basilar atelectasis or infiltrate. 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  IMPRESSION: Cardiomegaly. No convincing pulmonary edema. Small bilateral pleural effusion with bilateral basilar atelectasis or infiltrate.   Electronically Signed   By: Lahoma Crocker M.D.   On: 04/25/2014 15:23   TSH normal CK normal Mag normal   Etta Quill PA-C [facility] [phone]  04/27/2014, 1:09 PM  Patient seen and examined.  Clinical course and management discussed.  Necessary edits performed.  I agree with the above.  Assessment and plan of care developed and discussed below.     Assessment/Plan: 80 year old female at least a two year history of being nonambulatory and requiring full assist for all activities of daily living.  Patient with dementia as well.  On neurological examination with increased tone throughout and RUE tremor.  Head CT reviewed and shows atrophy and small vessel ischemic changes.  Parkinson's and small vessel disease are on the differential.  Since patient has been nonambulatory for such a long period of time, even if she has PD she may not be responsive to Sinemet due to deconditioning and her cognitive status. She can be given a trial of Sinemet CR starting at 25/100 BID and slowly increasing to two 50/200 BID with the goal of being ambulatory (she will need to be working with therapy).  Anything short of being ambulatory will likely not significantly impact this patient's quality of life and will  expose her to the possibility of side effects-OH and hallucinations to name a few.  It must also be considered that this will not help her cognition and she may actually get herself into more trouble and be harder to care for if she becomes ambulatory and is not cognitively intact.     Alexis Goodell, MD [facility] [phone]  04/27/2014  3:17 PM

## 2014-04-27 NOTE — Progress Notes (Signed)
Report called to nurse for 2W16. Patient ready to transfer. Family at the bedside.

## 2014-04-27 NOTE — Progress Notes (Signed)
Subjective: Off amio drip. Has no complaint. Answers questions very minimally.  Denies any complaint.  Objective: Vital signs in last 24 hours: Filed Vitals:   04/27/14 0750 04/27/14 0825 04/27/14 1150 04/27/14 1200  BP:    159/62  Pulse:      Temp: 99.1 F (37.3 C)  99 F (37.2 C)   TempSrc: Oral  Oral   Resp: $Remo'24  24 27  'xeNsX$ Height:      Weight:      SpO2: 98% 98% 96%    Weight change: -2.4 kg (-5 lb 4.7 oz)  Intake/Output Summary (Last 24 hours) at 04/27/14 1409 Last data filed at 04/27/14 1100  Gross per 24 hour  Intake    720 ml  Output   1400 ml  Net   -680 ml   Vitals reviewed. General: resting in bed, NAD HEENT: PERRL, EOMI, no scleral icterus Cardiac: RRR, no rubs, murmurs or gallops Pulm: clear to auscultation bilaterally, no wheezes, rales, or rhonchi Abd: soft, nontender, nondistended, BS present Ext: warm and well perfused, no pedal edema Neuro: alert and oriented to person and place, cannot do full neuro exam as patient doesn't follow commands properly. Has resting tremor, rigidity, cog wheeling, and bradykinesia.   Lab Results: Basic Metabolic Panel:  Recent Labs Lab 04/24/14 2206 04/25/14 0700 04/26/14 0340 04/27/14 0300  NA 137 137 138 140  K 4.1 4.4 3.9 3.3*  CL 103 106 107 101  CO2 $Re'22 19 19 21  'rpw$ GLUCOSE 147* 154* 109* 92  BUN $Re'12 11 10 10  'WiE$ CREATININE 0.83 0.72 0.83 0.94  CALCIUM 9.2 9.2 9.3 9.5  MG 1.8 2.0  --   --    Liver Function Tests:  Recent Labs Lab 04/24/14 2206 04/26/14 0340  AST 15 18  ALT 6 6  ALKPHOS 81 65  BILITOT 0.3 0.2*  PROT 6.4 5.6*  ALBUMIN 2.5* 2.0*   No results found for this basename: LIPASE, AMYLASE,  in the last 168 hours No results found for this basename: AMMONIA,  in the last 168 hours CBC:  Recent Labs Lab 04/23/14 2047  04/25/14 0700 04/25/14 1530  WBC 13.5*  < > 13.5* 10.4  NEUTROABS 11.6*  --   --  7.8*  HGB 12.0  13.9  < > 9.9* 9.0*  HCT 36.5  41.0  < > 30.8* 27.5*  MCV 94.1  < > 94.5  92.9  PLT 383  < > 316 283  < > = values in this interval not displayed. Cardiac Enzymes:  Recent Labs Lab 04/24/14 2206 04/25/14 1530  CKTOTAL  --  30  TROPONINI <0.30  --    BNP:  Recent Labs Lab 04/23/14 2048  PROBNP 403.4   D-Dimer:  Recent Labs Lab 04/24/14 2206  DDIMER 2.19*   CBG:  Recent Labs Lab 04/26/14 0907 04/26/14 1224 04/26/14 1636 04/26/14 2123 04/27/14 0748 04/27/14 1151  GLUCAP 115* 141* 137* 126* 95 124*   Hemoglobin A1C:  Recent Labs Lab 04/24/14 0545  HGBA1C 5.4   Fasting Lipid Panel: No results found for this basename: CHOL, HDL, LDLCALC, TRIG, CHOLHDL, LDLDIRECT,  in the last 168 hours Thyroid Function Tests:  Recent Labs Lab 04/24/14 2206  TSH 1.890  FREET4 1.00   Coagulation:  Recent Labs Lab 04/23/14 2047  LABPROT 15.4*  INR 1.22   Anemia Panel: No results found for this basename: VITAMINB12, FOLATE, FERRITIN, TIBC, IRON, RETICCTPCT,  in the last 168 hours Urine Drug Screen: Drugs of Abuse  No results found for this basename: labopia,  cocainscrnur,  labbenz,  amphetmu,  thcu,  labbarb    Alcohol Level: No results found for this basename: ETH,  in the last 168 hours Urinalysis:  Recent Labs Lab 04/23/14 2053  COLORURINE YELLOW  LABSPEC 1.022  PHURINE 5.5  [place]  UROBILINOGEN 0.2  NITRITE NEGATIVE  [place]. Labs:  Micro Results: Recent Results (from the past 240 hour(s))  CULTURE, BLOOD (ROUTINE X 2)     Status: None   Collection Time    04/23/14  8:42 PM      Result Value Ref Range Status   Specimen Description BLOOD RIGHT FOREARM   Final   Special Requests BOTTLES DRAWN AEROBIC AND ANAEROBIC 5CC   Final   Culture  Setup Time     Final   Value: 04/24/2014 01:58     Performed at [facility]   Culture     Final   Value:        BLOOD CULTURE RECEIVED NO GROWTH TO DATE CULTURE  WILL BE HELD FOR 5 DAYS BEFORE ISSUING A FINAL NEGATIVE REPORT     Performed at [facility]   Report Status PENDING   Incomplete  CULTURE, BLOOD (ROUTINE X 2)     Status: None   Collection Time    04/23/14  8:49 PM      Result Value Ref Range Status   Specimen Description BLOOD HAND RIGHT   Final   Special Requests BOTTLES DRAWN AEROBIC AND ANAEROBIC 5CCBLUE 3CCRED   Final   Culture  Setup Time     Final   Value: 04/24/2014 02:04     Performed at [facility]   Culture     Final   Value:        BLOOD CULTURE RECEIVED NO GROWTH TO DATE CULTURE WILL BE HELD FOR 5 DAYS BEFORE ISSUING A FINAL NEGATIVE REPORT     Performed at [facility]   Report Status PENDING   Incomplete  URINE CULTURE     Status: None   Collection Time    04/23/14  8:53 PM      Result Value Ref Range Status   Specimen Description URINE, CATHETERIZED   Final   Special Requests NONE   Final   Culture  Setup Time     Final   Value: 04/24/2014 08:59     Performed at [facility]     Final   Value: NO GROWTH     Performed at [facility]   Culture     Final   Value: NO GROWTH     Performed at [facility]   Report Status 04/25/2014 FINAL   Final  RESPIRATORY VIRUS PANEL     Status: None   Collection Time    04/24/14  2:35 PM      Result Value Ref Range Status   Source - RVPAN NASAL SWAB   Corrected   Comment: CORRECTED ON 10/09 AT 1144: PREVIOUSLY REPORTED AS NASAL SWAB   Respiratory Syncytial Virus A NOT DETECTED   Final   Respiratory Syncytial Virus B NOT DETECTED   Final   Influenza A NOT DETECTED   Final   Influenza B NOT DETECTED   Final   Parainfluenza 1 NOT DETECTED   Final   Parainfluenza 2 NOT DETECTED  Final   Parainfluenza 3 NOT DETECTED   Final   Metapneumovirus NOT DETECTED   Final   Rhinovirus NOT DETECTED   Final   Adenovirus NOT DETECTED   Final   Influenza A H1 NOT DETECTED   Final   Influenza A H3 NOT DETECTED   Final    Comment: (NOTE)           Normal Reference Range for each Analyte: NOT DETECTED     Testing performed using the Luminex xTAG Respiratory Viral Panel test     kit.     The analytical performance characteristics of this assay have been     determined by [facility].  The modifications have not been     cleared or approved by the FDA. This assay has been validated pursuant     to the CLIA regulations and is used for clinical purposes.     Performed at [facility], BLOOD (ROUTINE X 2)     Status: None   Collection Time    04/24/14 10:06 PM      Result Value Ref Range Status   Specimen Description BLOOD LEFT ARM   Final   Special Requests BOTTLES DRAWN AEROBIC ONLY 10CC   Final   Culture  Setup Time     Final   Value: 04/25/2014 01:53     Performed at [facility]   Culture     Final   Value:        BLOOD CULTURE RECEIVED NO GROWTH TO DATE CULTURE WILL BE HELD FOR 5 DAYS BEFORE ISSUING A FINAL NEGATIVE REPORT     Performed at [facility]   Report Status PENDING   Incomplete  CULTURE, BLOOD (ROUTINE X 2)     Status: None   Collection Time    04/24/14 10:15 PM      Result Value Ref Range Status   Specimen Description BLOOD LEFT FOREARM   Final   Special Requests     Final   Value: BOTTLES DRAWN AEROBIC AND ANAEROBIC 10CC AER,5CC ANA   Culture  Setup Time     Final   Value: 04/25/2014 01:52     Performed at [facility]   Culture     Final   Value:        BLOOD CULTURE RECEIVED NO GROWTH TO DATE CULTURE WILL BE HELD FOR 5 DAYS BEFORE ISSUING A FINAL NEGATIVE REPORT     Performed at [facility]   Report Status PENDING   Incomplete  MRSA PCR SCREENING     Status: None   Collection Time    04/25/14  3:00 AM      Result Value Ref Range Status   MRSA by PCR NEGATIVE  NEGATIVE Final   Comment:            The GeneXpert MRSA Assay (FDA     approved for NASAL specimens     only), is one component of a     comprehensive MRSA  colonization     surveillance program. It is not     intended to diagnose MRSA     infection nor to guide or     monitor treatment for     MRSA infections.   Studies/Results: Dg Chest Port 1 View  04/27/2014   CLINICAL DATA:  Pneumonia.  EXAM: PORTABLE CHEST - 1 VIEW  COMPARISON:  04/26/2014 and 04/25/2014  FINDINGS: Bilateral pleural effusions have diminished. Pulmonary  vascularity is normal. Cardiac silhouette is slightly prominent but the patient has a pericardial effusion as demonstrated on CT scan of 04/25/2014. No discrete infiltrates or pulmonary edema.  IMPRESSION: Decreasing bilateral pleural effusions.   Electronically Signed   By: Rozetta Nunnery M.D.   On: 04/27/2014 07:54   Dg Chest Port 1 View  04/26/2014   CLINICAL DATA:  Pneumonia  EXAM: PORTABLE CHEST - 1 VIEW  COMPARISON:  04/25/2014  FINDINGS: Cardiomegaly. Again noted small bilateral pleural effusion with bilateral basilar atelectasis or infiltrate. No convincing pulmonary edema.  IMPRESSION: Cardiomegaly. Again noted small bilateral pleural effusion with bilateral basilar atelectasis or infiltrate. No convincing pulmonary edema.   Electronically Signed   By: Lahoma Crocker M.D.   On: 04/26/2014 13:12   Dg Chest Port 1 View  04/25/2014   CLINICAL DATA:  Evaluate pneumonia  EXAM: PORTABLE CHEST - 1 VIEW  COMPARISON:  04/25/2014  FINDINGS: Cardiomegaly is noted. No convincing pulmonary edema. Bilateral small pleural effusion with bilateral basilar atelectasis or infiltrate. 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  IMPRESSION: Cardiomegaly. No convincing pulmonary edema. Small bilateral pleural effusion with bilateral basilar atelectasis or infiltrate.   Electronically Signed   By: Lahoma Crocker M.D.   On: 04/25/2014 15:23   Medications: I have reviewed the patient's current medications. Scheduled Meds: . acidophilus  1 capsule Oral Daily  . amiodarone  200 mg Oral BID  . antiseptic oral rinse  7 mL  Mouth Rinse BID  . arformoterol  15 mcg Nebulization BID  . aspirin EC  81 mg Oral Daily  . budesonide  0.5 mg Nebulization BID  . calcium-vitamin D  1 tablet Oral BID WC  . docusate sodium  200 mg Oral QHS  . heparin  5,000 Units Subcutaneous 3 times per day  . insulin aspart  0-9 Units Subcutaneous TID WC  . multivitamin with minerals  1 tablet Oral Daily  . pantoprazole  40 mg Oral Daily  . potassium chloride  40 mEq Oral BID  . potassium chloride  40 mEq Oral Once  . vitamin B-12  1,000 mcg Oral Q breakfast   Continuous Infusions:  PRN Meds:.acetaminophen, albuterol, guaiFENesin, HYDROcodone-homatropine, naphazoline, polyethylene glycol Assessment/Plan: Principal Problem:   HCAP (healthcare-associated pneumonia) Active Problems:   HTN (hypertension)   HLD (hyperlipidemia)   Asthma   Sepsis   Viral pneumonitis   A-fib   Pericardial effusion without cardiac tamponade  Acute Respiratory distress -unclear etiology but likely viral pneumonitis. CXR shows bilteral pleural effusion which is improving. CT angio showed no PE but showed small pericardial effusion, re-evaluated by 2d ECHO, without tamponade. - procalcitonin normal.- Mycoplasma pending, RSV panel normal  - initially treated for HCAP. CXR not impressive for CAP. But was on vanc+zosyn+azithromycin --> levaquin.   - this is likely not bacterial. Will d/c all abx. - continue supportive therapy, supplement o2 at needed. - will repeat 2D ECHO Monday to evaluate the pericardial effusion  Afib with RVR - now on NSR. Likely 2/2 to resp distress - CT angio no PE, but showed pericardial effusion as noted above.  - treating underlying resp problem would be the key. - was on amio drip, now on amio po. Continue for now, d/c on discharge. - appreciate card recs  Classical appearance of Parkinson on exam -resting tremor, bradykinesia, rigidity. likely has baseline dementia as well vs could have worsening acutely in the current  infectious stag  - per daughter, no hx of it or  diagnosis in the past. - Consulted neurology   Dispo: Disposition is deferred at this time, awaiting improvement of current medical problems.  Anticipated discharge in approximately 2-3 day(s).   The patient does have a current PCP Christain Sacramento, MD) and does need an [facility] hospital follow-up appointment after discharge.  The patient does have transportation limitations that hinder transportation to clinic appointments.  .Services Needed at time of discharge: Y = Yes, Blank = No PT: Y  OT:   RN:   Equipment:   Other:     LOS: 4 days   Dellia Nims, MD 04/27/2014, 2:09 PM

## 2014-04-27 NOTE — Progress Notes (Addendum)
Patient ID: Wendy Vasquez, female   DOB: May 11, 1924, 80 y.o.   MRN: [id number] .   SUBJECTIVE:  57F with HTN, HLD, DM, prior bleeding ulcers, ? Parkinson's disease who is admitted to the hospital after failed treatment for pneumonia. Cardiology was consulted for new onset atrial fibrillation in the setting of spiking fevers.   CT 10/6 no PE. + ?infiltrate. Moderate pericardial effusion.   Started on amiodarone. Now back in NSR.   Echo: EF 55-60%. There is small-moderate circumferential effusion posterior > anterior. There is significant (>25%) respiratory variation in mitral inflow but this is in setting of AF with RVR so uncertain significance. No RV collapse. IVC is dilated.  I do not think this is tamponade.   On po amiodarone.  Remains in NSR. (has tremor artifact on monitor) More communicative but still with clear cognitive deficits. No dyspnea.   Scheduled Meds: . acidophilus  1 capsule Oral Daily  . amiodarone  200 mg Oral BID  . antiseptic oral rinse  7 mL Mouth Rinse BID  . arformoterol  15 mcg Nebulization BID  . aspirin EC  81 mg Oral Daily  . budesonide  0.5 mg Nebulization BID  . calcium-vitamin D  1 tablet Oral BID WC  . docusate sodium  200 mg Oral QHS  . gemfibrozil  600 mg Oral BID  . heparin  5,000 Units Subcutaneous 3 times per day  . insulin aspart  0-9 Units Subcutaneous TID WC  . levofloxacin  750 mg Oral Q48H  . metoCLOPramide  10 mg Oral Q breakfast  . multivitamin with minerals  1 tablet Oral Daily  . pantoprazole  40 mg Oral Daily  . potassium chloride  40 mEq Oral BID  . vitamin B-12  1,000 mcg Oral Q breakfast   Continuous Infusions:   PRN Meds:.acetaminophen, albuterol, guaiFENesin, HYDROcodone-homatropine, naphazoline, polyethylene glycol    Filed Vitals:   04/27/14 0400 04/27/14 0500 04/27/14 0750 04/27/14 0825  BP: 117/51     Pulse: 110     Temp: 98 F (36.7 C)  99.1 F (37.3 C)   TempSrc: Oral  Oral   Resp: 23  24   Height:      Weight:   62.8 kg (138 lb 7.2 oz)    SpO2: 96%  98% 98%    Intake/Output Summary (Last 24 hours) at 04/27/14 0847 Last data filed at 04/27/14 0500  Gross per 24 hour  Intake    600 ml  Output   3240 ml  Net  -2640 ml    LABS: Basic Metabolic Panel:  Recent Labs  13/24/4009/01/01 2206 04/25/14 0700 04/26/14 0340 04/27/14 0300  NA 137 137 138 140  K 4.1 4.4 3.9 3.3*  CL 103 106 107 101  CO2 22 19 19 21   GLUCOSE 147* 154* 109* 92  BUN 12 11 10 10   CREATININE 0.83 0.72 0.83 0.94  CALCIUM 9.2 9.2 9.3 9.5  MG 1.8 2.0  --   --    Liver Function Tests:  Recent Labs  04/24/14 2206 04/26/14 0340  AST 15 18  ALT 6 6  ALKPHOS 81 65  BILITOT 0.3 0.2*  PROT 6.4 5.6*  ALBUMIN 2.5* 2.0*   No results found for this basename: LIPASE, AMYLASE,  in the last 72 hours CBC:  Recent Labs  04/25/14 0700 04/25/14 1530  WBC 13.5* 10.4  NEUTROABS  --  7.8*  HGB 9.9* 9.0*  HCT 30.8* 27.5*  MCV 94.5 92.9  PLT 316 283  Cardiac Enzymes:  Recent Labs  04/24/14 2206 04/25/14 1530  CKTOTAL  --  30  TROPONINI <0.30  --    BNP: No components found with this basename: POCBNP,  D-Dimer:  Recent Labs  04/24/14 2206  DDIMER 2.19*   Hemoglobin A1C: No results found for this basename: HGBA1C,  in the last 72 hours Fasting Lipid Panel: No results found for this basename: CHOL, HDL, LDLCALC, TRIG, CHOLHDL, LDLDIRECT,  in the last 72 hours Thyroid Function Tests:  Recent Labs  04/24/14 2206  TSH 1.890   Anemia Panel: No results found for this basename: VITAMINB12, FOLATE, FERRITIN, TIBC, IRON, RETICCTPCT,  in the last 72 hours  RADIOLOGY:  Ct Angio Chest Pe W/cm &/or Wo Cm  04/25/2014   CLINICAL DATA:  Shortness of breath.  Tachycardia.  Pneumonia.  EXAM: CT ANGIOGRAPHY CHEST WITH CONTRAST  TECHNIQUE: Multidetector CT imaging of the chest was performed using the standard protocol during bolus administration of intravenous contrast. Multiplanar CT image reconstructions and MIPs were  obtained to evaluate the vascular anatomy.  CONTRAST:  100mL OMNIPAQUE IOHEXOL 350 MG/ML SOLN  COMPARISON:  Two-view chest x-ray 04/24/2014.  FINDINGS: Pulmonary arterial opacification is excellent. There are no focal filling defects to suggest pulmonary emboli. The study is mildly degraded by patient breathing motion. This limits evaluation of distal small vessels.  A moderate-sized pericardial effusion measures up to 19 mm. This appears to be low-density. The heart size is normal. Atherosclerotic calcifications are noted in the aortic arch.  Bilateral pleural effusions are present. Associated mild airspace disease is present. No significant upper lobe airspace disease is present.  Bone window is are unremarkable. Mild exaggerated kyphosis is present in the thoracic spine.  Review of the MIP images confirms the above findings.  IMPRESSION: 1. Moderate-sized pericardial effusion. Developing tamponade is considered. 2. No evidence for pulmonary embolus. 3. Small moderate bilateral pleural effusions with associated airspace disease, likely atelectasis. 4. Significant patient breathing motion limits evaluation of distal small vessels. Critical Value/emergent results were called by telephone at the time of interpretation on 04/25/2014 at 1:14 am to Dr. Luisa DagoEverett Moding, who verbally acknowledged these results.   Electronically Signed   By: Gennette Pachris  Mattern M.D.   On: 04/25/2014 01:14    PHYSICAL EXAM General: chronically ill  Flat affect with masked facies Neck: JVP jaw cm, no thyromegaly or thyroid nodule.  Lungs: Decreased breath sounds at bases bilaterally.  CV: Nondisplaced PMI.  Heart regular S1/S2, no S3/S4, no murmur.  No peripheral edema.   Abdomen: Soft, nontender, no hepatosplenomegaly, no distention.  Neurologic: Alert but not communicative. Psych: Flat affect. Extremities: No clubbing or cyanosis.   TELEMETRY: Reviewed telemetry pt in NSR  ASSESSMENT AND PLAN: She has developed AF in setting of  recent PNA/viral illness. Now back in NSR on amiodarone. Would continue while in house and can stop on d/c.   Echo showed small to moderate pericardial effusion posterior > anterior. There did not appear to be pericardial tamponade.  Would continue supportive care at this point. Agree with not anti-coagulating fully in setting of pericardial effusion.  Will repeat echo on Monday. (If being discharge prior to that can get it over the weekend)  Will give one dose IV lasix.   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   We will follow at a distance.   Arvilla Meresaniel Leshea Jaggers MD 04/27/2014 8:47 AM

## 2014-04-28 LAB — GLUCOSE, CAPILLARY
GLUCOSE-CAPILLARY: 116 mg/dL — AB (ref 70–99)
Glucose-Capillary: 120 mg/dL — ABNORMAL HIGH (ref 70–99)
Glucose-Capillary: 118 mg/dL — ABNORMAL HIGH (ref 70–99)
Glucose-Capillary: 109 mg/dL — ABNORMAL HIGH (ref 70–99)
Glucose-Capillary: 122 mg/dL — ABNORMAL HIGH (ref 70–99)

## 2014-04-28 LAB — BASIC METABOLIC PANEL
ANION GAP: 15 (ref 5–15)
BUN: 16 mg/dL (ref 6–23)
CO2: 26 mEq/L (ref 19–32)
Calcium: 10.7 mg/dL — ABNORMAL HIGH (ref 8.4–10.5)
Chloride: 98 mEq/L (ref 96–112)
Creatinine, Ser: 0.94 mg/dL (ref 0.50–1.10)
GFR calc Af Amer: 61 mL/min — ABNORMAL LOW (ref >90)
GFR, EST NON AFRICAN AMERICAN: 53 mL/min — AB (ref >90)
Glucose, Bld: 109 mg/dL — ABNORMAL HIGH (ref 70–99)
Potassium: 4.3 mEq/L (ref 3.7–5.3)
SODIUM: 139 meq/L (ref 137–147)

## 2014-04-28 LAB — CBC
HCT: 29.3 % — ABNORMAL LOW (ref 36.0–46.0)
HCT: 28.6 % — ABNORMAL LOW (ref 36.0–46.0)
Hemoglobin: 9.8 g/dL — ABNORMAL LOW (ref 12.0–15.0)
Hemoglobin: 9.4 g/dL — ABNORMAL LOW (ref 12.0–15.0)
MCH: 30.4 pg (ref 26.0–34.0)
MCH: 30.2 pg (ref 26.0–34.0)
MCHC: 33.4 g/dL (ref 30.0–36.0)
MCHC: 32.9 g/dL (ref 30.0–36.0)
MCV: 91 fL (ref 78.0–100.0)
MCV: 92 fL (ref 78.0–100.0)
PLATELETS: 415 10*3/uL — AB (ref 150–400)
PLATELETS: 386 10*3/uL (ref 150–400)
RBC: 3.22 MIL/uL — ABNORMAL LOW (ref 3.87–5.11)
RBC: 3.11 MIL/uL — AB (ref 3.87–5.11)
RDW: 13.4 % (ref 11.5–15.5)
RDW: 13.6 % (ref 11.5–15.5)
WBC: 6.8 10*3/uL (ref 4.0–10.5)
WBC: 5.4 10*3/uL (ref 4.0–10.5)

## 2014-04-28 NOTE — Progress Notes (Signed)
Patient diaphoric. Skin warm v.s.s. And accu ck done 120 cont . To monitor patient.

## 2014-04-28 NOTE — Progress Notes (Signed)
Patient ID: Wendy Vasquez, female   DOB: [birth year], 80 y.o.   MRN: [id number] Medicine attending: I personally examined this patient this morning and I concur with the evaluation and management plan as outline by resident physician Dr. Hyacinth Meekerasrif Ahmed. We greatly appreciate neurology consult. Additional family history reveals that this lady was essentially immobile and in a wheelchair for the last 2 years and not ambulatory. She has had a chronic resting tremor. Although she likely does have Parkinson's disease, she also has small vessel cerebrovascular disease . I discussed management with neurology, Dr. Thad Rangereynolds. Although we could give this woman a trial of Sinemet, it has a potential to cause more harm than good. She has both cognitive and motor deficits. She has been non-ambulatory for so long that she has flexion contractures and disuse atrophy of upper and lower extremities. Given this scenario I personally vote  not to start her on the Sinemet. She has stabilized from a cardiorespiratory point of view. We will get a echocardiogram again on Monday to assess the pericardial effusion. She remains in sinus rhythm now on amiodarone. Her respiratory infection has slowly resolved. She is no longer coughing and is no longer in any distress.  Cephas DarbyJames Steele Ledonne, M.D., FACP

## 2014-04-28 NOTE — Progress Notes (Addendum)
Spoke with daughter regarding needs at the time of mothers discharge.  Daughter would like for mother to be placed in SNF for rehab. Notes that she's aware that [facility] to [facility] not taking [facility]. [facility]. Please contact Ferdinand CavaShiela Kennedy (niece) at [phone].  Bradley FerrisBrandie Francy Mcilvaine RN BSN 04/28/2014 4:32 PM

## 2014-04-28 NOTE — Progress Notes (Signed)
Subjective: Discussed with daughter yesterday. She stated patient had tremor in the past for few years. She stated that the patient's memory is very good. Daughter helps with ADL's. She has been wheelchair bound.  Patient is more interactive today but remains slow to respond. Denies any complaints other than some headache.   Objective: Vital signs in last 24 hours: Filed Vitals:   04/27/14 1617 04/27/14 2005 04/27/14 2108 04/28/14 0443  BP: 132/56 146/65 117/61 116/59  Pulse:  84 81 90  Temp: 98.7 F (37.1 C) 98.4 F (36.9 C) 99.5 F (37.5 C) 97.5 F (36.4 C)  TempSrc: Oral Oral Oral Oral  Resp: $Remo'22 22 24 20  'LWMVM$ Height:      Weight:    58.5 kg (128 lb 15.5 oz)  SpO2: 94% 99% 95% 93%   Weight change: -4.3 kg (-9 lb 7.7 oz)  Intake/Output Summary (Last 24 hours) at 04/28/14 0757 Last data filed at 04/27/14 2300  Gross per 24 hour  Intake    680 ml  Output    550 ml  Net    130 ml   Vitals reviewed. General: resting in bed, NAD, more interactive today.  HEENT: PERRL, EOMI, no scleral icterus Cardiac: RRR, no rubs, murmurs or gallops Pulm: clear to auscultation bilaterally, no wheezes, rales, or rhonchi Abd: soft, nontender, nondistended, BS present Ext: warm and well perfused, no pedal edema Neuro: alert and oriented to person and place and time today, cannot do full neuro exam as patient doesn't follow commands properly. Has resting tremor, rigidity, cog wheeling, and bradykinesia.   Lab Results: Basic Metabolic Panel:  Recent Labs Lab 04/24/14 2206 04/25/14 0700  04/27/14 0300 04/28/14 0230  NA 137 137  < > 140 139  K 4.1 4.4  < > 3.3* 4.3  CL 103 106  < > 101 98  CO2 22 19  < > 21 26  GLUCOSE 147* 154*  < > 92 109*  BUN 12 11  < > 10 16  CREATININE 0.83 0.72  < > 0.94 0.94  CALCIUM 9.2 9.2  < > 9.5 10.7*  MG 1.8 2.0  --   --   --   < > = values in this interval not displayed. Liver Function Tests:  Recent Labs Lab 04/24/14 2206 04/26/14 0340  AST 15 18   ALT 6 6  ALKPHOS 81 65  BILITOT 0.3 0.2*  PROT 6.4 5.6*  ALBUMIN 2.5* 2.0*   No results found for this basename: LIPASE, AMYLASE,  in the last 168 hours No results found for this basename: AMMONIA,  in the last 168 hours CBC:  Recent Labs Lab 04/23/14 2047  04/25/14 1530 04/28/14 0230  WBC 13.5*  < > 10.4 6.8  NEUTROABS 11.6*  --  7.8*  --   HGB 12.0  13.9  < > 9.0* 9.8*  HCT 36.5  41.0  < > 27.5* 29.3*  MCV 94.1  < > 92.9 91.0  PLT 383  < > 283 415*  < > = values in this interval not displayed. Cardiac Enzymes:  Recent Labs Lab 04/24/14 2206 04/25/14 1530  CKTOTAL  --  30  TROPONINI <0.30  --    BNP:  Recent Labs Lab 04/23/14 2048  PROBNP 403.4   D-Dimer:  Recent Labs Lab 04/24/14 2206  DDIMER 2.19*   CBG:  Recent Labs Lab 04/27/14 0748 04/27/14 1151 04/27/14 1649 04/27/14 2133 04/28/14 0449 04/28/14 0640  GLUCAP 95 124* 120* 113* 120* 118*  Hemoglobin A1C:  Recent Labs Lab 04/24/14 0545  HGBA1C 5.4   Fasting Lipid Panel: No results found for this basename: CHOL, HDL, LDLCALC, TRIG, CHOLHDL, LDLDIRECT,  in the last 168 hours Thyroid Function Tests:  Recent Labs Lab 04/24/14 2206  TSH 1.890  FREET4 1.00   Coagulation:  Recent Labs Lab 04/23/14 2047  LABPROT 15.4*  INR 1.22   Anemia Panel: No results found for this basename: VITAMINB12, FOLATE, FERRITIN, TIBC, IRON, RETICCTPCT,  in the last 168 hours Urine Drug Screen: Drugs of Abuse  No results found for this basename: labopia,  cocainscrnur,  labbenz,  amphetmu,  thcu,  labbarb    Alcohol Level: No results found for this basename: ETH,  in the last 168 hours Urinalysis:  Recent Labs Lab 04/23/14 2053  COLORURINE YELLOW  LABSPEC 1.022  PHURINE 5.5  [place]  UROBILINOGEN 0.2  NITRITE NEGATIVE  [place]. Labs:  Micro Results: Recent Results (from the  past 240 hour(s))  CULTURE, BLOOD (ROUTINE X 2)     Status: None   Collection Time    04/23/14  8:42 PM      Result Value Ref Range Status   Specimen Description BLOOD RIGHT FOREARM   Final   Special Requests BOTTLES DRAWN AEROBIC AND ANAEROBIC 5CC   Final   Culture  Setup Time     Final   Value: 04/24/2014 01:58     Performed at [facility]   Culture     Final   Value:        BLOOD CULTURE RECEIVED NO GROWTH TO DATE CULTURE WILL BE HELD FOR 5 DAYS BEFORE ISSUING A FINAL NEGATIVE REPORT     Performed at [facility]   Report Status PENDING   Incomplete  CULTURE, BLOOD (ROUTINE X 2)     Status: None   Collection Time    04/23/14  8:49 PM      Result Value Ref Range Status   Specimen Description BLOOD HAND RIGHT   Final   Special Requests BOTTLES DRAWN AEROBIC AND ANAEROBIC 5CCBLUE 3CCRED   Final   Culture  Setup Time     Final   Value: 04/24/2014 02:04     Performed at [facility]   Culture     Final   Value:        BLOOD CULTURE RECEIVED NO GROWTH TO DATE CULTURE WILL BE HELD FOR 5 DAYS BEFORE ISSUING A FINAL NEGATIVE REPORT     Performed at [facility]   Report Status PENDING   Incomplete  URINE CULTURE     Status: None   Collection Time    04/23/14  8:53 PM      Result Value Ref Range Status   Specimen Description URINE, CATHETERIZED   Final   Special Requests NONE   Final   Culture  Setup Time     Final   Value: 04/24/2014 08:59     Performed at [facility]     Final   Value: NO GROWTH     Performed at [facility]   Culture     Final   Value: NO GROWTH     Performed at [facility]   Report Status 04/25/2014 FINAL   Final  RESPIRATORY VIRUS PANEL     Status: None   Collection Time    04/24/14  2:35 PM      Result Value Ref Range Status   Source - RVPAN NASAL SWAB   Corrected   Comment: CORRECTED ON 10/09 AT 1144: PREVIOUSLY REPORTED AS NASAL SWAB   Respiratory Syncytial Virus A NOT DETECTED    Final   Respiratory Syncytial Virus B NOT DETECTED   Final   Influenza A NOT DETECTED   Final   Influenza B NOT DETECTED   Final   Parainfluenza 1 NOT DETECTED   Final   Parainfluenza 2 NOT DETECTED   Final   Parainfluenza 3 NOT DETECTED   Final   Metapneumovirus NOT DETECTED   Final   Rhinovirus NOT DETECTED   Final   Adenovirus NOT DETECTED   Final   Influenza A H1 NOT DETECTED   Final   Influenza A H3 NOT DETECTED   Final   Comment: (NOTE)           Normal Reference Range for each Analyte: NOT DETECTED     Testing performed using the Luminex xTAG Respiratory Viral Panel test     kit.     The analytical performance characteristics of this assay have been     determined by [facility].  The modifications have not been     cleared or approved by the FDA. This assay has been validated pursuant     to the CLIA regulations and is used for clinical purposes.     Performed at [facility], BLOOD (ROUTINE X 2)     Status: None   Collection Time    04/24/14 10:06 PM      Result Value Ref Range Status   Specimen Description BLOOD LEFT ARM   Final   Special Requests BOTTLES DRAWN AEROBIC ONLY 10CC   Final   Culture  Setup Time     Final   Value: 04/25/2014 01:53     Performed at [facility]   Culture     Final   Value:        BLOOD CULTURE RECEIVED NO GROWTH TO DATE CULTURE WILL BE HELD FOR 5 DAYS BEFORE ISSUING A FINAL NEGATIVE REPORT     Performed at [facility]   Report Status PENDING   Incomplete  CULTURE, BLOOD (ROUTINE X 2)     Status: None   Collection Time    04/24/14 10:15 PM      Result Value Ref Range Status   Specimen Description BLOOD LEFT FOREARM   Final   Special Requests     Final   Value: BOTTLES DRAWN AEROBIC AND ANAEROBIC 10CC AER,5CC ANA   Culture  Setup Time     Final   Value: 04/25/2014 01:52     Performed at [facility]   Culture     Final   Value:        BLOOD CULTURE RECEIVED NO GROWTH TO DATE CULTURE  WILL BE HELD FOR 5 DAYS BEFORE ISSUING A FINAL NEGATIVE REPORT     Performed at [facility]   Report Status PENDING   Incomplete  MRSA PCR SCREENING     Status: None   Collection Time    04/25/14  3:00 AM      Result Value Ref Range Status   MRSA by PCR NEGATIVE  NEGATIVE Final   Comment:            The GeneXpert MRSA Assay (FDA     approved for NASAL specimens  only), is one component of a     comprehensive MRSA colonization     surveillance program. It is not     intended to diagnose MRSA     infection nor to guide or     monitor treatment for     MRSA infections.   Studies/Results: Dg Chest Port 1 View  04/27/2014   CLINICAL DATA:  Pneumonia.  EXAM: PORTABLE CHEST - 1 VIEW  COMPARISON:  04/26/2014 and 04/25/2014  FINDINGS: Bilateral pleural effusions have diminished. Pulmonary vascularity is normal. Cardiac silhouette is slightly prominent but the patient has a pericardial effusion as demonstrated on CT scan of 04/25/2014. No discrete infiltrates or pulmonary edema.  IMPRESSION: Decreasing bilateral pleural effusions.   Electronically Signed   By: Rozetta Nunnery M.D.   On: 04/27/2014 07:54   Dg Chest Port 1 View  04/26/2014   CLINICAL DATA:  Pneumonia  EXAM: PORTABLE CHEST - 1 VIEW  COMPARISON:  04/25/2014  FINDINGS: Cardiomegaly. Again noted small bilateral pleural effusion with bilateral basilar atelectasis or infiltrate. No convincing pulmonary edema.  IMPRESSION: Cardiomegaly. Again noted small bilateral pleural effusion with bilateral basilar atelectasis or infiltrate. No convincing pulmonary edema.   Electronically Signed   By: Lahoma Crocker M.D.   On: 04/26/2014 13:12   Medications: I have reviewed the patient's current medications. Scheduled Meds: . acidophilus  1 capsule Oral Daily  . amiodarone  200 mg Oral BID  . antiseptic oral rinse  7 mL Mouth Rinse BID  . arformoterol  15 mcg Nebulization BID  . aspirin EC  81 mg Oral Daily  . budesonide  0.5 mg Nebulization  BID  . calcium-vitamin D  1 tablet Oral BID WC  . docusate sodium  200 mg Oral QHS  . heparin  5,000 Units Subcutaneous 3 times per day  . insulin aspart  0-9 Units Subcutaneous TID WC  . multivitamin with minerals  1 tablet Oral Daily  . pantoprazole  40 mg Oral Daily  . potassium chloride  40 mEq Oral BID  . vitamin B-12  1,000 mcg Oral Q breakfast   Continuous Infusions:  PRN Meds:.acetaminophen, albuterol, guaiFENesin, HYDROcodone-homatropine, naphazoline, polyethylene glycol Assessment/Plan: Principal Problem:   HCAP (healthcare-associated pneumonia) Active Problems:   HTN (hypertension)   HLD (hyperlipidemia)   Asthma   Sepsis   Viral pneumonitis   A-fib   Pericardial effusion without cardiac tamponade   Abnormality of gait  Acute Respiratory distress -improved. unclear etiology but likely viral pneumonitis. CXR shows bilteral pleural effusion which is improving. CT angio showed no PE but showed small pericardial effusion, re-evaluated by 2d ECHO, without tamponade. - procalcitonin normal.- Mycoplasma pending, RSV panel normal  - initially treated for HCAP. CXR not impressive for CAP. But was on vanc+zosyn+azithromycin --> levaquin. This is not likely bacterial - d/ced abx. This is likely a viral infection even though resp viral panel negative, viral course likely getting better - continue supportive therapy, supplement o2 at needed. 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   - per daughter, no hx of it or diagnosis in the past but patient had been wheelchair bound and depended on daughter for ADL's for last ~2 years. She is likely severely deconditioned. neuroloy consulted and they don't recommend treating her for parkinson. Reason for not treating is because her mobility limitation may be due to her deconditioning which  will not improve with parkinson meds. Also, even if she is able to mobile, with her congnitive impairment that may be harmful for her and put her in risk for hurting herself. We have decided not to treat her PD which she may very well have.   Afib with RVR - now on NSR. Likely 2/2 to resp distress - CT angio no PE, but showed pericardial effusion as noted above.  - treating underlying resp problem would be the key which is improving. - was on amio drip, now on amio po. Continue for now, d/c on discharge. - appreciate card recs   Dispo: Disposition is deferred at this time, awaiting improvement of current medical problems.  Anticipated discharge in approximately 2-3 day(s).   The patient does have a current PCP Christain Sacramento, MD) and does need an [facility] hospital follow-up appointment after discharge.  The patient does have transportation limitations that hinder transportation to clinic appointments.  .Services Needed at time of discharge: Y = Yes, Blank = No PT: Y  OT:   RN:   Equipment:   Other:     LOS: 5 days   Dellia Nims, MD 04/28/2014, 7:57 AM

## 2014-04-29 LAB — BASIC METABOLIC PANEL
Anion gap: 13 (ref 5–15)
BUN: 19 mg/dL (ref 6–23)
CO2: 24 mEq/L (ref 19–32)
Calcium: 10.1 mg/dL (ref 8.4–10.5)
Chloride: 99 mEq/L (ref 96–112)
Creatinine, Ser: 0.91 mg/dL (ref 0.50–1.10)
GFR calc Af Amer: 63 mL/min — ABNORMAL LOW (ref >90)
GFR calc non Af Amer: 55 mL/min — ABNORMAL LOW (ref >90)
Glucose, Bld: 109 mg/dL — ABNORMAL HIGH (ref 70–99)
Potassium: 5 mEq/L (ref 3.7–5.3)
SODIUM: 136 meq/L — AB (ref 137–147)

## 2014-04-29 LAB — GLUCOSE, CAPILLARY
GLUCOSE-CAPILLARY: 118 mg/dL — AB (ref 70–99)
Glucose-Capillary: 105 mg/dL — ABNORMAL HIGH (ref 70–99)
Glucose-Capillary: 126 mg/dL — ABNORMAL HIGH (ref 70–99)

## 2014-04-29 MED ORDER — CARBIDOPA-LEVODOPA ER 25-100 MG PO TBCR
1.0000 | EXTENDED_RELEASE_TABLET | Freq: Two times a day (BID) | ORAL | Status: DC
  Administered 2014-04-30 – 2014-05-01 (×2): 1 via ORAL
  Filled 2014-04-29 (×5): qty 1

## 2014-04-29 NOTE — Progress Notes (Signed)
  Clinically stable. Will repeat echo in am to reassess effusion.   Biran Mayberry,MD 8:30 AM

## 2014-04-29 NOTE — Progress Notes (Addendum)
Subjective:  Doing well. No longer coughing. Has some feet pain but no other complaints.   Daughter wants to take patient to SNF. Patient will get ECHO tomorrow.  Objective: Vital signs in last 24 hours: Filed Vitals:   04/28/14 0931 04/28/14 1454 04/28/14 2029 04/29/14 0513  BP:  104/48 115/56 106/60  Pulse:  75 82 78  Temp:  99.3 F (37.4 C) 98.6 F (37 C) 99.2 F (37.3 C)  TempSrc:  Oral Oral Oral  Resp:  $Remo'20 20 20  'NTcoi$ Height:      Weight:    57.1 kg (125 lb 14.1 oz)  SpO2: 94% 96% 98% 96%   Weight change: -1.4 kg (-3 lb 1.4 oz)  Intake/Output Summary (Last 24 hours) at 04/29/14 1114 Last data filed at 04/28/14 1600  Gross per 24 hour  Intake    240 ml  Output    500 ml  Net   -260 ml   Vitals reviewed. General: resting in bed, NAD, more interactive today.  HEENT: PERRL, EOMI, no scleral icterus Cardiac: RRR, no rubs, murmurs or gallops Pulm: clear to auscultation bilaterally, no wheezes, rales, or rhonchi Abd: soft, nontender, nondistended, BS present Ext: warm and well perfused, no pedal edema Neuro: alert and oriented to person and place and time today, cannot do full neuro exam as patient doesn't follow commands properly. Has resting tremor, rigidity, cog wheeling, and bradykinesia.   Lab Results: Basic Metabolic Panel:  Recent Labs Lab 04/24/14 2206 04/25/14 0700  04/28/14 0230 04/29/14 0337  NA 137 137  < > 139 136*  K 4.1 4.4  < > 4.3 5.0  CL 103 106  < > 98 99  CO2 22 19  < > 26 24  GLUCOSE 147* 154*  < > 109* 109*  BUN 12 11  < > 16 19  CREATININE 0.83 0.72  < > 0.94 0.91  CALCIUM 9.2 9.2  < > 10.7* 10.1  MG 1.8 2.0  --   --   --   < > = values in this interval not displayed. Liver Function Tests:  Recent Labs Lab 04/24/14 2206 04/26/14 0340  AST 15 18  ALT 6 6  ALKPHOS 81 65  BILITOT 0.3 0.2*  PROT 6.4 5.6*  ALBUMIN 2.5* 2.0*   No results found for this basename: LIPASE, AMYLASE,  in the last 168 hours No results found for this  basename: AMMONIA,  in the last 168 hours CBC:  Recent Labs Lab 04/23/14 2047  04/25/14 1530 04/28/14 0230 04/28/14 1932  WBC 13.5*  < > 10.4 6.8 5.4  NEUTROABS 11.6*  --  7.8*  --   --   HGB 12.0  13.9  < > 9.0* 9.8* 9.4*  HCT 36.5  41.0  < > 27.5* 29.3* 28.6*  MCV 94.1  < > 92.9 91.0 92.0  PLT 383  < > 283 415* 386  < > = values in this interval not displayed. Cardiac Enzymes:  Recent Labs Lab 04/24/14 2206 04/25/14 1530  CKTOTAL  --  30  TROPONINI <0.30  --    BNP:  Recent Labs Lab 04/23/14 2048  PROBNP 403.4   D-Dimer:  Recent Labs Lab 04/24/14 2206  DDIMER 2.19*   CBG:  Recent Labs Lab 04/28/14 0449 04/28/14 0640 04/28/14 1119 04/28/14 1620 04/28/14 2034 04/29/14 0634  GLUCAP 120* 118* 116* 109* 122* 118*   Hemoglobin A1C:  Recent Labs Lab 04/24/14 0545  HGBA1C 5.4   Fasting Lipid Panel: No  results found for this basename: CHOL, HDL, LDLCALC, TRIG, CHOLHDL, LDLDIRECT,  in the last 168 hours Thyroid Function Tests:  Recent Labs Lab 04/24/14 2206  TSH 1.890  FREET4 1.00   Coagulation:  Recent Labs Lab 04/23/14 2047  LABPROT 15.4*  INR 1.22   Anemia Panel: No results found for this basename: VITAMINB12, FOLATE, FERRITIN, TIBC, IRON, RETICCTPCT,  in the last 168 hours Urine Drug Screen: Drugs of Abuse  No results found for this basename: labopia,  cocainscrnur,  labbenz,  amphetmu,  thcu,  labbarb    Alcohol Level: No results found for this basename: ETH,  in the last 168 hours Urinalysis:  Recent Labs Lab 04/23/14 2053  COLORURINE YELLOW  LABSPEC 1.022  PHURINE 5.5  GLUCOSEU NEGATIVE  HGBUR TRACE*  Jane  UROBILINOGEN 0.2  NITRITE NEGATIVE  [place]. Labs:  Micro Results: Recent Results (from the past 240 hour(s))  CULTURE, BLOOD (ROUTINE X 2)     Status: None   Collection Time    04/23/14  8:42 PM      Result Value Ref Range Status    Specimen Description BLOOD RIGHT FOREARM   Final   Special Requests BOTTLES DRAWN AEROBIC AND ANAEROBIC 5CC   Final   Culture  Setup Time     Final   Value: 04/24/2014 01:58     Performed at [facility]   Culture     Final   Value:        BLOOD CULTURE RECEIVED NO GROWTH TO DATE CULTURE WILL BE HELD FOR 5 DAYS BEFORE ISSUING A FINAL NEGATIVE REPORT     Performed at [facility]   Report Status PENDING   Incomplete  CULTURE, BLOOD (ROUTINE X 2)     Status: None   Collection Time    04/23/14  8:49 PM      Result Value Ref Range Status   Specimen Description BLOOD HAND RIGHT   Final   Special Requests BOTTLES DRAWN AEROBIC AND ANAEROBIC 5CCBLUE 3CCRED   Final   Culture  Setup Time     Final   Value: 04/24/2014 02:04     Performed at [facility]   Culture     Final   Value:        BLOOD CULTURE RECEIVED NO GROWTH TO DATE CULTURE WILL BE HELD FOR 5 DAYS BEFORE ISSUING A FINAL NEGATIVE REPORT     Performed at [facility]   Report Status PENDING   Incomplete  URINE CULTURE     Status: None   Collection Time    04/23/14  8:53 PM      Result Value Ref Range Status   Specimen Description URINE, CATHETERIZED   Final   Special Requests NONE   Final   Culture  Setup Time     Final   Value: 04/24/2014 08:59     Performed at [facility]     Final   Value: NO GROWTH     Performed at [facility]   Culture     Final   Value: NO GROWTH     Performed at [facility]   Report Status 04/25/2014 FINAL   Final  RESPIRATORY VIRUS PANEL     Status: None   Collection Time    04/24/14  2:35 PM      Result Value Ref Range Status   Source -  RVPAN NASAL SWAB   Corrected   Comment: CORRECTED ON 10/09 AT 1144: PREVIOUSLY REPORTED AS NASAL SWAB   Respiratory Syncytial Virus A NOT DETECTED   Final   Respiratory Syncytial Virus B NOT DETECTED   Final   Influenza A NOT DETECTED   Final   Influenza B NOT DETECTED   Final    Parainfluenza 1 NOT DETECTED   Final   Parainfluenza 2 NOT DETECTED   Final   Parainfluenza 3 NOT DETECTED   Final   Metapneumovirus NOT DETECTED   Final   Rhinovirus NOT DETECTED   Final   Adenovirus NOT DETECTED   Final   Influenza A H1 NOT DETECTED   Final   Influenza A H3 NOT DETECTED   Final   Comment: (NOTE)           Normal Reference Range for each Analyte: NOT DETECTED     Testing performed using the Luminex xTAG Respiratory Viral Panel test     kit.     The analytical performance characteristics of this assay have been     determined by [facility].  The modifications have not been     cleared or approved by the FDA. This assay has been validated pursuant     to the CLIA regulations and is used for clinical purposes.     Performed at [facility], BLOOD (ROUTINE X 2)     Status: None   Collection Time    04/24/14 10:06 PM      Result Value Ref Range Status   Specimen Description BLOOD LEFT ARM   Final   Special Requests BOTTLES DRAWN AEROBIC ONLY 10CC   Final   Culture  Setup Time     Final   Value: 04/25/2014 01:53     Performed at [facility]   Culture     Final   Value:        BLOOD CULTURE RECEIVED NO GROWTH TO DATE CULTURE WILL BE HELD FOR 5 DAYS BEFORE ISSUING A FINAL NEGATIVE REPORT     Performed at [facility]   Report Status PENDING   Incomplete  CULTURE, BLOOD (ROUTINE X 2)     Status: None   Collection Time    04/24/14 10:15 PM      Result Value Ref Range Status   Specimen Description BLOOD LEFT FOREARM   Final   Special Requests     Final   Value: BOTTLES DRAWN AEROBIC AND ANAEROBIC 10CC AER,5CC ANA   Culture  Setup Time     Final   Value: 04/25/2014 01:52     Performed at [facility]   Culture     Final   Value:        BLOOD CULTURE RECEIVED NO GROWTH TO DATE CULTURE WILL BE HELD FOR 5 DAYS BEFORE ISSUING A FINAL NEGATIVE REPORT     Performed at [facility]   Report Status PENDING    Incomplete  MRSA PCR SCREENING     Status: None   Collection Time    04/25/14  3:00 AM      Result Value Ref Range Status   MRSA by PCR NEGATIVE  NEGATIVE Final   Comment:            The GeneXpert MRSA Assay (FDA     approved for NASAL specimens     only), is one component of a     comprehensive MRSA colonization  surveillance program. It is not     intended to diagnose MRSA     infection nor to guide or     monitor treatment for     MRSA infections.   Studies/Results: No results found. Medications: I have reviewed the patient's current medications. Scheduled Meds: . acidophilus  1 capsule Oral Daily  . amiodarone  200 mg Oral BID  . antiseptic oral rinse  7 mL Mouth Rinse BID  . arformoterol  15 mcg Nebulization BID  . aspirin EC  81 mg Oral Daily  . budesonide  0.5 mg Nebulization BID  . calcium-vitamin D  1 tablet Oral BID WC  . docusate sodium  200 mg Oral QHS  . heparin  5,000 Units Subcutaneous 3 times per day  . insulin aspart  0-9 Units Subcutaneous TID WC  . multivitamin with minerals  1 tablet Oral Daily  . pantoprazole  40 mg Oral Daily  . potassium chloride  40 mEq Oral BID  . vitamin B-12  1,000 mcg Oral Q breakfast   Continuous Infusions:  PRN Meds:.acetaminophen, albuterol, guaiFENesin, HYDROcodone-homatropine, naphazoline, polyethylene glycol Assessment/Plan: Principal Problem:   HCAP (healthcare-associated pneumonia) Active Problems:   HTN (hypertension)   HLD (hyperlipidemia)   Asthma   Sepsis   Viral pneumonitis   A-fib   Pericardial effusion without cardiac tamponade   Abnormality of gait  Acute Respiratory distress -improved. No longer coughing. Breathing fine. unclear etiology but likely viral pneumonitis. CXR shows bilteral pleural effusion which is improving. CT angio showed no PE but showed small pericardial effusion, re-evaluated by 2d ECHO, without tamponade. - procalcitonin normal.negative Mycoplasma pending, RSV panel normal  -  initially treated for HCAP. CXR not impressive for CAP. But was on vanc+zosyn+azithromycin --> levaquin. This is not likely bacterial - d/ced abx. This is likely a viral infection even though resp viral panel negative, viral course likely getting better  - continue supportive therapy, supplement o2 at needed. - will repeat 2D ECHO Monday to evaluate the pericardial effusion which was seen previously. Will send home if pericardial effusion (likely viral) improving on echo.  Movement disorder - likely PD, with some cognitive impairment -resting tremor, bradykinesia, rigidity.   - per daughter, no hx of it or diagnosis in the past but patient had been wheelchair bound and depended on daughter for ADL's for last ~2 years. She is likely severely deconditioned. neuroloy consulted and they don't recommend treating her for parkinson. Reason for not treating is because her mobility limitation may be due to her deconditioning which will not improve with parkinson meds. Also, even if she is able to mobile, with her congnitive impairment that may be harmful for her and put her in risk for hurting herself. We have decided not to treat her PD which she may very well have.   -addendum: daughter wanted something for her tremor. Will do trial of sinemet 20/100 BID per neuro recs. This has quick effect so we will see if it helps her tremor.  Afib with RVR - now on NSR. Likely 2/2 to resp distress - CT angio no PE, but showed pericardial effusion as noted above.  - treating underlying resp problem would be the key which is improving. - was on amio drip, now on amio po. Continue for now, d/c on discharge. - appreciate card recs   Dispo: Disposition is deferred at this time, awaiting improvement of current medical problems.  Anticipated discharge in approximately 2-3 day(s).   The patient does have a  current PCP Christain Sacramento, MD) and does need an [facility] hospital follow-up appointment after discharge.  The patient  does have transportation limitations that hinder transportation to clinic appointments.  .Services Needed at time of discharge: Y = Yes, Blank = No PT: Y  OT:   RN:   Equipment:   Other:     LOS: 6 days   Dellia Nims, MD 04/29/2014, 11:14 AM

## 2014-04-29 NOTE — Progress Notes (Signed)
Patient ID: Wendy Vasquez [name], female   DOB: 1923/10/09, 80 y.o.   MRN: [id number] Medicine attending: I personally examined this patient today together with resident physician Dr. Hyacinth Meekerasrif Ahmed and I concur with his evaluation and management plan. She is making steady progress. She remains afebrile, in normal sinus rhythm, cough has resolved. She is mentally more alert. She has fixed neurologic deficits which are unchanged. Per cardiology recommendations we will discontinue amiodarone at time of discharge. Acute atrial fibrillation likely related to concomitant onset of high fever in this elderly woman. She will have a followup echocardiogram tomorrow to monitor status of her pericardial effusion. I am not sure what medication we can put her on to control her tremors given her low blood pressure. I would ordinarily consider a trial of a low-dose beta blocker. We will have to ask neurology whether or not low-dose Sinemet is a reasonable option to control at least this sign off her Parkinson's disease. Anticipate that the daughter will take her home again. She has been her main caregiver. I am amazed that she continues to act in this capacity given the significant handicaps that her mother has.  Cephas DarbyJames Danyia Borunda, MD, FACP  Hematology-Oncology/Internal Medicine

## 2014-04-30 DIAGNOSIS — I480 Paroxysmal atrial fibrillation: Secondary | ICD-10-CM

## 2014-04-30 DIAGNOSIS — I319 Disease of pericardium, unspecified: Secondary | ICD-10-CM

## 2014-04-30 LAB — GLUCOSE, CAPILLARY
Glucose-Capillary: 116 mg/dL — ABNORMAL HIGH (ref 70–99)
Glucose-Capillary: 150 mg/dL — ABNORMAL HIGH (ref 70–99)
Glucose-Capillary: 115 mg/dL — ABNORMAL HIGH (ref 70–99)
Glucose-Capillary: 105 mg/dL — ABNORMAL HIGH (ref 70–99)

## 2014-04-30 LAB — CULTURE, BLOOD (ROUTINE X 2)
CULTURE: NO GROWTH
Culture: NO GROWTH

## 2014-04-30 MED ORDER — POTASSIUM CHLORIDE CRYS ER 20 MEQ PO TBCR: 20.0000 meq | EXTENDED_RELEASE_TABLET | Freq: Once | ORAL | Status: DC

## 2014-04-30 MED ORDER — CARBIDOPA-LEVODOPA ER 25-100 MG PO TBCR: 1.0000 | EXTENDED_RELEASE_TABLET | Freq: Two times a day (BID) | ORAL | Status: DC

## 2014-04-30 NOTE — Clinical Social Work Note (Signed)
CSW is working with family for SNF (if insurance [employer] is rec'd) -vs- home. Will ask MD to consider peer to peer with [employer] [employer] [facility] for potential auth. At this time family is inquiring with DSS about Medicaid status as well as she has [place] Medicaid. CSW will f/u tomorrow for further planning-  Reece LevyJanet Joniyah Vasquez, MSW, [employer]WA [phone]

## 2014-04-30 NOTE — Progress Notes (Signed)
Echo Lab  Limited 2D Echocardiogram completed.  Wendy Vasquez L Wendy Vasquez, RDCS 04/30/2014 8:32 AM

## 2014-04-30 NOTE — Progress Notes (Signed)
1500 NT calls in RN to room to assess pt sacrum which was reddened but it blanches upon assessment no skin break noted; pt was observed to be sitting up in chair prior to this; positioned on her left side, pillow to support behind and new foam dsg applied to pt sacrum prophylactic; pt daughter at bedside and pt and family education provided to pt and family. All voices understanding and denies any questions. Wendy MerlesP. Amo Dimond Crotty RN..Marland Kitchen

## 2014-04-30 NOTE — Progress Notes (Signed)
Subjective: Alert but not very talkative.  She says her foot hurts  Objective: Vital signs in last 24 hours: Temp:  [97.5 F (36.4 C)-97.8 F (36.6 C)] 97.5 F (36.4 C) (10/12 0611) Pulse Rate:  [73-92] 92 (10/12 0611) Resp:  [18-20] 18 (10/11 2030) BP: (112-138)/(59-66) 138/66 mmHg (10/12 0611) SpO2:  [96 %-97 %] 97 % (10/12 0611) Weight:  [125 lb 7.1 oz (56.9 kg)] 125 lb 7.1 oz (56.9 kg) (10/12 0500) Last BM Date: 04/29/14  Intake/Output from previous day: 10/11 0701 - 10/12 0700 In: 360 [P.O.:360] Out: -  Intake/Output this shift:    Medications Current Facility-Administered Medications  Medication Dose Route Frequency Provider Last Rate Last Dose  . acetaminophen (TYLENOL) tablet 650 mg  650 mg Oral Q6H PRN Levert FeinsteinJames M Granfortuna, MD   650 mg at 04/29/14 2237  . acidophilus (RISAQUAD) capsule 1 capsule  1 capsule Oral Daily Lorretta HarpXilin Niu, MD   1 capsule at 04/29/14 1008  . albuterol (PROVENTIL) (2.5 MG/3ML) 0.083% nebulizer solution 2.5 mg  2.5 mg Nebulization Q6H PRN Levert FeinsteinJames M Granfortuna, MD      . amiodarone (PACERONE) tablet 200 mg  200 mg Oral BID Laurey Moralealton S McLean, MD   200 mg at 04/29/14 2227  . antiseptic oral rinse (CPC / CETYLPYRIDINIUM CHLORIDE 0.05%) solution 7 mL  7 mL Mouth Rinse BID Rahul P Desai, PA-C   7 mL at 04/29/14 2200  . arformoterol (BROVANA) nebulizer solution 15 mcg  15 mcg Nebulization BID Rahul P Desai, PA-C   15 mcg at 04/29/14 1937  . aspirin EC tablet 81 mg  81 mg Oral Daily Lorretta HarpXilin Niu, MD   81 mg at 04/29/14 1007  . budesonide (PULMICORT) nebulizer solution 0.5 mg  0.5 mg Nebulization BID Rahul P Desai, PA-C   0.5 mg at 04/29/14 1937  . calcium-vitamin D (OSCAL WITH D) 500-200 MG-UNIT per tablet 1 tablet  1 tablet Oral BID WC Lorretta HarpXilin Niu, MD   1 tablet at 04/29/14 1007  . Carbidopa-Levodopa ER (SINEMET CR) 25-100 MG tablet controlled release 1 tablet  1 tablet Oral BID Tasrif Ahmed, MD      . docusate sodium (COLACE) capsule 200 mg  200 mg Oral QHS  Lorretta HarpXilin Niu, MD   200 mg at 04/29/14 2227  . guaiFENesin (MUCINEX) 12 hr tablet 600 mg  600 mg Oral BID PRN Lorretta HarpXilin Niu, MD   600 mg at 04/26/14 0207  . heparin injection 5,000 Units  5,000 Units Subcutaneous 3 times per day Lorretta HarpXilin Niu, MD   5,000 Units at 04/30/14 0615  . HYDROcodone-homatropine (HYCODAN) 5-1.5 MG/5ML syrup 5 mL  5 mL Oral Q6H PRN Lorretta HarpXilin Niu, MD   5 mL at 04/29/14 1322  . insulin aspart (novoLOG) injection 0-9 Units  0-9 Units Subcutaneous TID WC Lorretta HarpXilin Niu, MD   1 Units at 04/27/14 1344  . multivitamin with minerals tablet 1 tablet  1 tablet Oral Daily Lorretta HarpXilin Niu, MD   1 tablet at 04/29/14 1008  . naphazoline (NAPHCON) 0.1 % ophthalmic solution 1 drop  1 drop Both Eyes QID PRN Lorretta HarpXilin Niu, MD   1 drop at 04/27/14 0537  . pantoprazole (PROTONIX) EC tablet 40 mg  40 mg Oral Daily Lorretta HarpXilin Niu, MD   40 mg at 04/29/14 1008  . polyethylene glycol (MIRALAX / GLYCOLAX) packet 17 g  17 g Oral Daily PRN Lorretta HarpXilin Niu, MD      . potassium chloride SA (K-DUR,KLOR-CON) CR tablet 40 mEq  40 mEq  Oral BID Hyacinth Meekerasrif Ahmed, MD   40 mEq at 04/29/14 2226  . vitamin B-12 (CYANOCOBALAMIN) tablet 1,000 mcg  1,000 mcg Oral Q breakfast Lorretta HarpXilin Niu, MD   1,000 mcg at 04/29/14 1007    PE: General appearance: alert, cooperative and no distress Lungs: clear to auscultation bilaterally Heart: regular rate and rhythm, S1, S2 normal, no murmur, click, rub or gallop Extremities: No LEE Pulses: 2+ radials.  1+ DPs.  Feet warm  Skin: WArm and dry Neurologic: Grossly normal  Lab Results:   Recent Labs  04/28/14 0230 04/28/14 1932  WBC 6.8 5.4  HGB 9.8* 9.4*  HCT 29.3* 28.6*  PLT 415* 386   BMET  Recent Labs  04/28/14 0230 04/29/14 0337  NA 139 136*  K 4.3 5.0  CL 98 99  CO2 26 24  GLUCOSE 109* 109*  BUN 16 19  CREATININE 0.94 0.91  CALCIUM 10.7* 10.1      Assessment/Plan  28F with HTN, HLD, DM, prior bleeding ulcers, ? Parkinson's disease who is admitted to the hospital after failed treatment for  pneumonia. Cardiology was consulted for new onset atrial fibrillation in the setting of spiking fevers.   Principal Problem:   HCAP (healthcare-associated pneumonia) Active Problems:   HTN (hypertension)   HLD (hyperlipidemia)   Asthma   Sepsis   Viral pneumonitis   A-fib   Pericardial effusion without cardiac tamponade   Abnormality of gait   Plan:   Maintaining NSR.  On Amiodarone 200mg  PO BID.  The plan is to stop the amio at DC.  Not anticoagulating due to effusion.  CT 10/6 no PE. + ?infiltrate. Moderate pericardial effusion.   Echo 04/25/14: EF 55-60%. There is small-moderate circumferential effusion posterior > anterior.    Repeat echo planned.      LOS: 7 days    Charlton HawsPeter Arrin Ishler

## 2014-04-30 NOTE — Progress Notes (Addendum)
Subjective:  Started trial of sinemet for her tremor yesterday.she has less tremor currently. Is talking more than yesterday. Denies any complaints. Daughter wants to take patient to SNF. Got echo today showing resolution of the pericardial effusion.  Objective: Vital signs in last 24 hours: Filed Vitals:   04/29/14 1937 04/29/14 2030 04/30/14 0500 04/30/14 0611  BP:  119/59  138/66  Pulse:  76  92  Temp:  97.5 F (36.4 C)  97.5 F (36.4 C)  TempSrc:  Oral  Oral  Resp:  18    Height:      Weight:   56.9 kg (125 lb 7.1 oz)   SpO2: 96% 96%  97%   Weight change: -0.2 kg (-7.1 oz)  Intake/Output Summary (Last 24 hours) at 04/30/14 0733 Last data filed at 04/29/14 1645  Gross per 24 hour  Intake    360 ml  Output      0 ml  Net    360 ml   Vitals reviewed. General: resting in bed, NAD, more interactive today.  HEENT: PERRL, EOMI, no scleral icterus Cardiac: RRR, no rubs, murmurs or gallops Pulm: clear to auscultation bilaterally, no wheezes, rales, or rhonchi Abd: soft, nontender, nondistended, BS present Ext: warm and well perfused, no pedal edema Neuro: alert and oriented to person and place and time today, cannot do full neuro exam as patient doesn't follow commands properly. Has resting tremor on right hand but less than yesterday. Has contracted flexion on the left arm. , rigidity, cog wheeling, and bradykinesia.   Lab Results: Basic Metabolic Panel:  Recent Labs Lab 04/24/14 2206 04/25/14 0700  04/28/14 0230 04/29/14 0337  NA 137 137  < > 139 136*  K 4.1 4.4  < > 4.3 5.0  CL 103 106  < > 98 99  CO2 22 19  < > 26 24  GLUCOSE 147* 154*  < > 109* 109*  BUN 12 11  < > 16 19  CREATININE 0.83 0.72  < > 0.94 0.91  CALCIUM 9.2 9.2  < > 10.7* 10.1  MG 1.8 2.0  --   --   --   < > = values in this interval not displayed. Liver Function Tests:  Recent Labs Lab 04/24/14 2206 04/26/14 0340  AST 15 18  ALT 6 6  ALKPHOS 81 65  BILITOT 0.3 0.2*  PROT 6.4 5.6*    ALBUMIN 2.5* 2.0*   No results found for this basename: LIPASE, AMYLASE,  in the last 168 hours No results found for this basename: AMMONIA,  in the last 168 hours CBC:  Recent Labs Lab 04/23/14 2047  04/25/14 1530 04/28/14 0230 04/28/14 1932  WBC 13.5*  < > 10.4 6.8 5.4  NEUTROABS 11.6*  --  7.8*  --   --   HGB 12.0  13.9  < > 9.0* 9.8* 9.4*  HCT 36.5  41.0  < > 27.5* 29.3* 28.6*  MCV 94.1  < > 92.9 91.0 92.0  PLT 383  < > 283 415* 386  < > = values in this interval not displayed. Cardiac Enzymes:  Recent Labs Lab 04/24/14 2206 04/25/14 1530  CKTOTAL  --  30  TROPONINI <0.30  --    BNP:  Recent Labs Lab 04/23/14 2048  PROBNP 403.4   D-Dimer:  Recent Labs Lab 04/24/14 2206  DDIMER 2.19*   CBG:  Recent Labs Lab 04/28/14 1620 04/28/14 2034 04/29/14 0634 04/29/14 1125 04/29/14 2035 04/30/14 0610  GLUCAP 109* 122*  118* 105* 126* 116*   Hemoglobin A1C:  Recent Labs Lab 04/24/14 0545  HGBA1C 5.4   Fasting Lipid Panel: No results found for this basename: CHOL, HDL, LDLCALC, TRIG, CHOLHDL, LDLDIRECT,  in the last 168 hours Thyroid Function Tests:  Recent Labs Lab 04/24/14 2206  TSH 1.890  FREET4 1.00   Coagulation:  Recent Labs Lab 04/23/14 2047  LABPROT 15.4*  INR 1.22   Anemia Panel: No results found for this basename: VITAMINB12, FOLATE, FERRITIN, TIBC, IRON, RETICCTPCT,  in the last 168 hours Urine Drug Screen: Drugs of Abuse  No results found for this basename: labopia,  cocainscrnur,  labbenz,  amphetmu,  thcu,  labbarb    Alcohol Level: No results found for this basename: ETH,  in the last 168 hours Urinalysis:  Recent Labs Lab 04/23/14 2053  COLORURINE YELLOW  LABSPEC 1.022  PHURINE 5.5  [place]  UROBILINOGEN 0.2  NITRITE NEGATIVE  [place]. Labs:  Micro Results: Recent Results (from the past 240  hour(s))  CULTURE, BLOOD (ROUTINE X 2)     Status: None   Collection Time    04/23/14  8:42 PM      Result Value Ref Range Status   Specimen Description BLOOD RIGHT FOREARM   Final   Special Requests BOTTLES DRAWN AEROBIC AND ANAEROBIC 5CC   Final   Culture  Setup Time     Final   Value: 04/24/2014 01:58     Performed at [facility]   Culture     Final   Value:        BLOOD CULTURE RECEIVED NO GROWTH TO DATE CULTURE WILL BE HELD FOR 5 DAYS BEFORE ISSUING A FINAL NEGATIVE REPORT     Performed at [facility]   Report Status PENDING   Incomplete  CULTURE, BLOOD (ROUTINE X 2)     Status: None   Collection Time    04/23/14  8:49 PM      Result Value Ref Range Status   Specimen Description BLOOD HAND RIGHT   Final   Special Requests BOTTLES DRAWN AEROBIC AND ANAEROBIC 5CCBLUE 3CCRED   Final   Culture  Setup Time     Final   Value: 04/24/2014 02:04     Performed at [facility]   Culture     Final   Value:        BLOOD CULTURE RECEIVED NO GROWTH TO DATE CULTURE WILL BE HELD FOR 5 DAYS BEFORE ISSUING A FINAL NEGATIVE REPORT     Performed at [facility]   Report Status PENDING   Incomplete  URINE CULTURE     Status: None   Collection Time    04/23/14  8:53 PM      Result Value Ref Range Status   Specimen Description URINE, CATHETERIZED   Final   Special Requests NONE   Final   Culture  Setup Time     Final   Value: 04/24/2014 08:59     Performed at [place]     Final   Value: NO GROWTH     Performed at [facility]   Culture     Final   Value: NO GROWTH     Performed at [facility]   Report Status 04/25/2014 FINAL   Final  RESPIRATORY VIRUS PANEL     Status: None  Collection Time    04/24/14  2:35 PM      Result Value Ref Range Status   Source - RVPAN NASAL SWAB   Corrected   Comment: CORRECTED ON 10/09 AT 1144: PREVIOUSLY REPORTED AS NASAL SWAB   Respiratory Syncytial Virus A NOT DETECTED   Final     Respiratory Syncytial Virus B NOT DETECTED   Final   Influenza A NOT DETECTED   Final   Influenza B NOT DETECTED   Final   Parainfluenza 1 NOT DETECTED   Final   Parainfluenza 2 NOT DETECTED   Final   Parainfluenza 3 NOT DETECTED   Final   Metapneumovirus NOT DETECTED   Final   Rhinovirus NOT DETECTED   Final   Adenovirus NOT DETECTED   Final   Influenza A H1 NOT DETECTED   Final   Influenza A H3 NOT DETECTED   Final   Comment: (NOTE)           Normal Reference Range for each Analyte: NOT DETECTED     Testing performed using the Luminex xTAG Respiratory Viral Panel test     kit.     The analytical performance characteristics of this assay have been     determined by [facility].  The modifications have not been     cleared or approved by the FDA. This assay has been validated pursuant     to the CLIA regulations and is used for clinical purposes.     Performed at [place], BLOOD (ROUTINE X 2)     Status: None   Collection Time    04/24/14 10:06 PM      Result Value Ref Range Status   Specimen Description BLOOD LEFT ARM   Final   Special Requests BOTTLES DRAWN AEROBIC ONLY 10CC   Final   Culture  Setup Time     Final   Value: 04/25/2014 01:53     Performed at [facility]   Culture     Final   Value:        BLOOD CULTURE RECEIVED NO GROWTH TO DATE CULTURE WILL BE HELD FOR 5 DAYS BEFORE ISSUING A FINAL NEGATIVE REPORT     Performed at [facility]   Report Status PENDING   Incomplete  CULTURE, BLOOD (ROUTINE X 2)     Status: None   Collection Time    04/24/14 10:15 PM      Result Value Ref Range Status   Specimen Description BLOOD LEFT FOREARM   Final   Special Requests     Final   Value: BOTTLES DRAWN AEROBIC AND ANAEROBIC 10CC AER,5CC ANA   Culture  Setup Time     Final   Value: 04/25/2014 01:52     Performed at [facility]   Culture     Final   Value:        BLOOD CULTURE RECEIVED NO GROWTH TO DATE CULTURE WILL BE  HELD FOR 5 DAYS BEFORE ISSUING A FINAL NEGATIVE REPORT     Performed at [facility]   Report Status PENDING   Incomplete  MRSA PCR SCREENING     Status: None   Collection Time    04/25/14  3:00 AM      Result Value Ref Range Status   MRSA by PCR NEGATIVE  NEGATIVE Final   Comment:            The GeneXpert MRSA Assay (FDA  approved for NASAL specimens     only), is one component of a     comprehensive MRSA colonization     surveillance program. It is not     intended to diagnose MRSA     infection nor to guide or     monitor treatment for     MRSA infections.   Studies/Results: No results found. Medications: I have reviewed the patient's current medications. Scheduled Meds: . acidophilus  1 capsule Oral Daily  . amiodarone  200 mg Oral BID  . antiseptic oral rinse  7 mL Mouth Rinse BID  . arformoterol  15 mcg Nebulization BID  . aspirin EC  81 mg Oral Daily  . budesonide  0.5 mg Nebulization BID  . calcium-vitamin D  1 tablet Oral BID WC  . Carbidopa-Levodopa ER  1 tablet Oral BID  . docusate sodium  200 mg Oral QHS  . heparin  5,000 Units Subcutaneous 3 times per day  . insulin aspart  0-9 Units Subcutaneous TID WC  . multivitamin with minerals  1 tablet Oral Daily  . pantoprazole  40 mg Oral Daily  . potassium chloride  40 mEq Oral BID  . vitamin B-12  1,000 mcg Oral Q breakfast   Continuous Infusions:  PRN Meds:.acetaminophen, albuterol, guaiFENesin, HYDROcodone-homatropine, naphazoline, polyethylene glycol Assessment/Plan: Principal Problem:   HCAP (healthcare-associated pneumonia) Active Problems:   HTN (hypertension)   HLD (hyperlipidemia)   Asthma   Sepsis   Viral pneumonitis   A-fib   Pericardial effusion without cardiac tamponade   Abnormality of gait  Acute Respiratory distress -improved. No longer coughing. More interactive, Breathing fine. unclear etiology but likely viral pneumonitis.  - initially treated for HCAP. CXR not impressive for  CAP. But was on vanc+zosyn+azithromycin --> levaquin. This is not likely bacterial - d/ced abx. This is likely a viral infection even though resp viral panel negative, viral course likely getting better  CXR shows bilteral pleural effusion which is improving. CT angio showed no PE but showed small pericardial effusion, re-evaluated by 2d ECHO, without tamponade. - procalcitonin normal.negative Mycoplasma pending, RSV panel normal  Repeat echo today shows resolution of previous small pericardial effusion likely from viral etiology. Cards ok for discharge today.  Movement disorder - likely PD, with some cognitive impairment -resting tremor, bradykinesia, rigidity.   - per daughter, no hx of it or diagnosis in the past but patient had been wheelchair bound and depended on daughter for ADL's for last ~2 years. She is likely severely deconditioned. neuroloy consulted and they don't recommend treating her for parkinson. Reason for not treating is because her mobility limitation may be due to her deconditioning which will not improve with parkinson meds. Also, even if she is able to mobile, with her congnitive impairment that may be harmful for her and put her in risk for hurting herself. We have decided not to treat her initially. We did however started sinemet 20/100 BID yesterday per daughter's request for her tremor. It seems to be helping her somewhat today. Will continue this for now. It can be increased slowly in the future.   Afib with RVR - chads2vasc 4- now on NSR. Likely 2/2 to resp distress.   - CT angio no PE, but showed pericardial effusion as noted above.  - treating underlying resp problem would be the key which is improving. - was on amio drip, now on amio po. d/c on discharge. - appreciate card recs: no anti-coag currently with the pericardial effusion recently.  Will do asa $Remo'81mg'jhaFu$  daily.  Dispo: Disposition is deferred at this time, awaiting improvement of current medical problems.   Anticipated discharge in approximately 2-3 day(s).   The patient does have a current PCP Christain Sacramento, MD) and does need an [facility] hospital follow-up appointment after discharge.  The patient does have transportation limitations that hinder transportation to clinic appointments.  .Services Needed at time of discharge: Y = Yes, Blank = No PT: Y  OT:   RN:   Equipment:   Other:     LOS: 7 days   Dellia Nims, MD 04/30/2014, 7:33 AM

## 2014-04-30 NOTE — Progress Notes (Signed)
Physical Therapy Treatment Patient Details Name: Wendy Vasquez MRN: [id number] DOB: September 28, 1923 Today's Date: 04/30/2014    History of Present Illness Wendy Vasquez is an 80 y.o. female with with past medical history of hypertension, asthma, hyperlipidemia, and generalized deconditioning, gastric ulcer, who presents with cough, chest pain and fever    PT Comments    Progressing slowly.  Emphasis on dissociation of upper and lower trunk, Full ROM and truncal activation with rocking techniquew  Follow Up Recommendations  SNF;Supervision/Assistance - 24 hour     Equipment Recommendations  Hospital bed;Other (comment)    Recommendations for Other Services       Precautions / Restrictions Precautions Precautions: Fall Restrictions Weight Bearing Restrictions: No    Mobility  Bed Mobility Overal bed mobility: Needs Assistance Bed Mobility: Supine to Sit     Supine to sit: Total assist Sit to supine: Total assist   General bed mobility comments: Pt unable to initiate movement even given extra time.  Transfers Overall transfer level: Needs assistance Equipment used: 1 person hand held assist Transfers: Stand Pivot Transfers Sit to Stand: Total assist Stand pivot transfers: Total assist       General transfer comment: slow assist to attempt to get initiation of movement.Weak clasp of R UE for assist.  Unable to tell if pt assisted with stand or not.  Ambulation/Gait             General Gait Details: non ambulatory at baseline   Stairs            Wheelchair Mobility    Modified Rankin (Stroke Patients Only)       Balance Overall balance assessment: Needs assistance Sitting-balance support: No upper extremity supported Sitting balance-Leahy Scale: Poor Sitting balance - Comments: worked at EOB x 12 minutes working on dissociation of upper and lower trunk, truncal activation, rocking to see if she could continue     Standing balance-Leahy Scale: Zero                       Cognition Arousal/Alertness: Awake/alert Behavior During Therapy: Flat affect Overall Cognitive Status: Difficult to assess (answers question given extra time)                      Exercises Other Exercises Other Exercises: spent time on upper and lower extremities with ROM stretche into full flexion and extension and repetitive motions.  L U and LE's stiffer and less kinetic than R side.    General Comments        Pertinent Vitals/Pain Pain Assessment: No/denies pain (no facial grimaces or verbal expressions of pain with ROM)    Home Living                      Prior Function            PT Goals (current goals can now be found in the care plan section) Acute Rehab PT Goals Patient Stated Goal: to get strength back PT Goal Formulation: With patient Time For Goal Achievement: 05/08/14 Potential to Achieve Goals: Fair Progress towards PT goals: Progressing toward goals    Frequency  Min 2X/week    PT Plan Current plan remains appropriate    Co-evaluation PT/OT/SLP Co-Evaluation/Treatment: Yes           End of Session   Activity Tolerance: Patient tolerated treatment well Patient left: in chair;with call bell/phone within reach     Time:  [id number] PT Time Calculation (min): 27 min  Charges:  $Therapeutic Activity: 23-37 mins                    G Codes:      Altonio Schwertner, Eliseo GumKenneth V 04/30/2014, 11:15 AM 04/30/2014  [place] BingKen Adriella Essex, PT [phone]  (pager)

## 2014-04-30 NOTE — Discharge Summary (Signed)
Medicine attending discharge note: I personally examined this patient today together with resident physician Dr. Hyacinth Meekerasrif Ahmed and I attest to the accuracy of his evaluation and discharge plan.  80 year old woman initially admitted from September 18 of September 25 with a "community acquired pneumonia". There were no gross infiltrates or effusions on chest radiograph. She continued to have a persistent hacking nonproductive cough and low-grade fever after discharge and her daughter brought her back for further evaluation. She was afebrile on admission. She had a spasmodic cough which was causing her significant distress. There were questionable vague early infiltrates in the right lung on chest radiograph. I felt she likely had a viral infection. She was put  back on parenteral antibiotics pending culture results. Azithromycin was added to cover atypical organisms such as mycoplasma. Mycoplasma titer returned undetectable. Legionella antigen in the urine undetectable.  Her neurologic status was difficult to assess. Initial reports by her daughter of extreme mental clarity when not supported by the patient's behavior. She had a flat affect. She was minimally interactive. She had a flexion contracture of her left upper extremity and left hand. She had a persistent significant tremor of her right hand. She was unable to move her legs. Further history revealed that she was wheelchair-bound and had not ambulated for the last 2 years. She had cogwheel rigidity. A masklike facies. In short, she had all the signs and symptoms of Parkinson's disease.  On the night of admission, she spiked a fever of 103 and went into rapid atrial fibrillation. She was transferred to the step down unit. She was started on a amiodarone infusion. She did go back into sinus rhythm over the next 24 hours. CT scan of the chest done at time of acute decompensation showed a moderate-sized pericardial effusion. Small bilateral pleural  effusions. No pulmonary infiltrates. She was seen in consultation by cardiology. Echocardiogram confirmed the pericardial effusion but there were no signs of imminent cardiac tamponade. Followup echocardiogram on day of discharge October 12 showed interval resolution of the pericardial effusion.  She slowly improved. She remained in sinus rhythm. Her cough resolved. Most recent chest radiograph from October 9 showed decreased bilateral pleural effusions and normal pulmonary vascularity. Heart upper limit normal size. No discrete infiltrates.  She was seen in consultation by neurology for further assessment of her Parkinson's-like symptoms and signs. Advice was to proceed with caution with a trial of Sinemet since there seemed to be little chance that she would regain her mobility given the duration of her nonambulatory status and advanced contractures as well as the concomitant small vessel disease in the brain seen on the CT scan of her head done on October 5. 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  She was much more interactive by time of discharge and was able to communicate with fluent speech. She still had multiple physical handicaps as outlined above.  Daughter agreed that transition to an extended care facility would be the most reasonable  at time of discharge.  Impression: #1. Viral pneumonitis #2. Pericardial effusion secondary to #1 #3. Transient nature fibrillation secondary to high fever and pneumonitis #4. Likely Parkinson's disease #5. Small vessel cerebrovascular disease  Cephas DarbyJames Fraya Ueda, MD, FACP  Hematology-Oncology/Internal Medicine

## 2014-04-30 NOTE — Progress Notes (Signed)
 PHYSICAL MEDICINE AND REHABILITATION  CONSULT SERVICE NOTE  Chart reviewed. This is an 80 yo with dementia requiring substantial assistance prior to hospital admission. Therapy is recommending SNF at this time, and I would agree as inpatient rehab would not be an appropriate environment for her given her numerous chronic issues.   Ranelle OysterZachary T. Yaffa Seckman, MD, [facility] CenterFAAPMR [facility] 04/30/2014

## 2014-04-30 NOTE — Discharge Summary (Addendum)
Name: Wendy Vasquez MRN: [id number] DOB: 1924-03-28 80 y.o. PCP: Barbie BannerFred H Wilson, MD  Date of Admission: 04/23/2014  8:14 PM Date of Discharge: 05/01/2014 Attending Physician: Levert FeinsteinJames M Granfortuna, MD  Discharge Diagnosis: Principal Problem:   HCAP (healthcare-associated pneumonia) Active Problems:   HTN (hypertension)   HLD (hyperlipidemia)   Asthma   Sepsis   Viral pneumonitis   A-fib   Pericardial effusion without cardiac tamponade   Abnormality of gait  Discharge Medications:   Medication List    STOP taking these medications       amoxicillin-clavulanate 500-125 MG per tablet  Commonly known as:  AUGMENTIN     gemfibrozil 600 MG tablet  Commonly known as:  LOPID     metoCLOPramide 10 MG tablet  Commonly known as:  REGLAN      TAKE these medications       acetaminophen 500 MG tablet  Commonly known as:  TYLENOL  Take 1,000 mg by mouth 4 (four) times daily. Take everyday per daughter     ADVAIR DISKUS 250-50 MCG/DOSE Aepb  Generic drug:  Fluticasone-Salmeterol  Inhale 1 puff into the lungs 2 (two) times daily.     ALPRAZolam 0.25 MG tablet  Commonly known as:  XANAX  Take 0.5 tablets by mouth at bedtime.     aspirin 81 MG tablet  Take 81 mg by mouth daily.     calcium-vitamin D 500-200 MG-UNIT per tablet  Commonly known as:  OSCAL WITH D  Take 1 tablet by mouth 2 (two) times daily.     Carbidopa-Levodopa ER 25-100 MG tablet controlled release  Commonly known as:  SINEMET CR  Take 1 tablet by mouth 2 (two) times daily.     Cranberry 1000 MG Caps  Take 2,000 mg by mouth daily with breakfast.     docusate sodium 100 MG capsule  Commonly known as:  COLACE  Take 200 mg by mouth at bedtime.     esomeprazole 40 MG capsule  Commonly known as:  NEXIUM  Take 40 mg by mouth at bedtime.     guaiFENesin 600 MG 12 hr tablet  Commonly known as:  MUCINEX  Take 600 mg by mouth 2 (two) times daily as needed for cough or to loosen phlegm.     HYDROcodone-homatropine 5-1.5 MG/5ML syrup  Commonly known as:  HYCODAN  Take 5 mLs by mouth every 6 (six) hours as needed for cough.     multivitamin with minerals Tabs tablet  Take 1 tablet by mouth daily.     polyethylene glycol packet  Commonly known as:  MIRALAX / GLYCOLAX  Take 17 g by mouth daily as needed for mild constipation.     potassium chloride SA 20 MEQ tablet  Commonly known as:  K-DUR,KLOR-CON  Take 1 tablet (20 mEq total) by mouth once.     PROBIOTIC PO  Take 1 capsule by mouth daily.     VISINE 0.05 % ophthalmic solution  Generic drug:  tetrahydrozoline  Place 1 drop into both eyes daily as needed (for dry eyes).     vitamin B-12 1000 MCG tablet  Commonly known as:  CYANOCOBALAMIN  Take 1,000 mcg by mouth daily with breakfast.        Disposition and follow-up:   Ms.Shakerria K [name] [facility] St[facility].  At the hospital follow up visit please address:  1.  She has improved significantly with her respiratory status which was likely a  viral infection even though she was initially treated with abx. Please assess how she is doing.   We also started trial of sinemet to help her tremor per daughter's request. She likely has parkinson's disease. See below for details.  Please monitor her for recurrence of Afib. Please assess her need for anticoagulation.  2.  Labs / imaging needed at time of follow-up: EKG  3.  Pending labs/ test needing follow-up:   Follow-up Appointments: Follow-up Information   Follow up with Ronie Spies, PA-C On 05/17/2014. (10:30AM)    Specialty:  Cardiology   Contact information:   [address] [place] [address] [phone]       Discharge Instructions:   Consultations:    Procedures Performed:  Dg Chest 1 View  04/11/2014   CLINICAL DATA:  Cough, weakness, confusion  EXAM: CHEST - 1 VIEW  COMPARISON:  04/06/2014  FINDINGS: Cardiomediastinal silhouette  is stable. No pulmonary edema. Left basilar atelectasis or infiltrate. Stable sclerotic changes proximal right humerus.  IMPRESSION: Left basilar atelectasis or infiltrate.  No pulmonary edema.   Electronically Signed   By: Natasha Mead M.D.   On: 04/11/2014 09:42   Dg Chest 2 View  04/24/2014   CLINICAL DATA:  Patient with hospital acquired pneumonia  EXAM: CHEST  2 VIEW  COMPARISON:  Chest radiograph 04/23/2014  FINDINGS: Low lung volumes. Stable cardiac and mediastinal contours. Heterogeneous opacities right mid and lower lung. Minimal heterogeneous opacities left lung base. Small bilateral pleural effusions. High-riding right humeral head. Sclerotic lesion proximal right humerus may represent enchondroma are bone infarct. Thoracic spine degenerative change.  IMPRESSION: Low lung volumes.  Right mid and lower lung as well as left lower lung heterogeneous opacities which may represent scattered atelectasis or infection.   Electronically Signed   By: Annia Belt M.D.   On: 04/24/2014 09:11   Dg Chest 2 View  04/12/2014   ADDENDUM REPORT: 04/12/2014 14:05  ADDENDUM: The impression should read stable mild left base subsegmental atelectasis and/or infiltrate. Please ignore the word 'could' .   Electronically Signed   By: Maisie Fus  Register   On: 04/12/2014 14:05   04/12/2014   CLINICAL DATA:  Pneumonia.  EXAM: CHEST  2 VIEW  COMPARISON:  04/11/2014.  FINDINGS: Mediastinum and hilar structures are normal. Heart size normal. Persistent mild left basilar atelectasis and or infiltrate. Lungs otherwise clear. No pneumothorax. 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 Degenerative changes both shoulders and thoracic spine.  IMPRESSION: Stable mild left base subsegmental atelectasis and or infiltrate could  Electronically Signed: ByMaisie Fus  Register On: 04/12/2014 07:47   Dg Chest 2 View  04/06/2014   CLINICAL DATA:  Weakness  EXAM: CHEST  2 VIEW  COMPARISON:  November 09, 2013   FINDINGS: There is no edema or consolidation. Heart size and pulmonary vascularity are normal. No adenopathy. There is degenerative change in the thoracic spine. There is a presumed bone infarct in the proximal right humerus, stable. There is evidence of superior migration of the right humeral head, suggesting chronic rotator cuff tear.  IMPRESSION: No edema or consolidation. Apparent bone infarct right proximal humerus. Evidence of chronic rotator cuff tear in the right shoulder. Bones are osteoporotic.   Electronically Signed   By: Bretta Bang M.D.   On: 04/06/2014 09:26   Ct Head Wo Contrast  04/23/2014   CLINICAL DATA:  Generalized weakness, acute onset. Initial encounter.  EXAM: CT HEAD WITHOUT CONTRAST  TECHNIQUE: Contiguous  axial images were obtained from the base of the skull through the vertex without intravenous contrast.  COMPARISON:  None.  FINDINGS: There is no evidence of acute infarction, or intra- or extra-axial hemorrhage on CT.  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 Prominence of the ventricles and sulci reflects mild to moderate cortical volume loss. Mild cerebellar atrophy is noted. Scattered periventricular white matter change likely reflects small vessel ischemic microangiopathy.  The brainstem and fourth ventricle are within normal limits. The basal ganglia are unremarkable in appearance. The cerebral hemispheres demonstrate grossly normal gray-white differentiation. No mass effect or midline shift is seen.  There is no evidence of fracture; visualized osseous structures are unremarkable in appearance. The visualized portions of the orbits are within normal limits. The paranasal sinuses and mastoid air cells are well-aerated. No significant soft tissue abnormalities are seen.  IMPRESSION: 1. No acute intracranial pathology seen on CT. 2. 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 Sudden death due to a colloid cyst  at this age is rare, but would usually present with headaches and/or vomiting. 3. Mild to moderate cortical volume loss and scattered small vessel ischemic microangiopathy.   Electronically Signed   By: Roanna RaiderJeffery  Chang M.D.   On: 04/23/2014 22:17   Ct Angio Chest Pe W/cm &/or Wo Cm  04/25/2014   CLINICAL DATA:  Shortness of breath.  Tachycardia.  Pneumonia.  EXAM: CT ANGIOGRAPHY CHEST WITH CONTRAST  TECHNIQUE: Multidetector CT imaging of the chest was performed using the standard protocol during bolus administration of intravenous contrast. Multiplanar CT image reconstructions and MIPs were obtained to evaluate the vascular anatomy.  CONTRAST:  100mL OMNIPAQUE IOHEXOL 350 MG/ML SOLN  COMPARISON:  Two-view chest x-ray 04/24/2014.  FINDINGS: Pulmonary arterial opacification is excellent. There are no focal filling defects to suggest pulmonary emboli. The study is mildly degraded by patient breathing motion. This limits evaluation of distal small vessels.  A moderate-sized pericardial effusion measures up to 19 mm. This appears to be low-density. The heart size is normal. Atherosclerotic calcifications are noted in the aortic arch.  Bilateral pleural effusions are present. Associated mild airspace disease is present. No significant upper lobe airspace disease is present.  Bone window is are unremarkable. Mild exaggerated kyphosis is present in the thoracic spine.  Review of the MIP images confirms the above findings.  IMPRESSION: 1. Moderate-sized pericardial effusion. Developing tamponade is considered. 2. No evidence for pulmonary embolus. 3. Small moderate bilateral pleural effusions with associated airspace disease, likely atelectasis. 4. Significant patient breathing motion limits evaluation of distal small vessels. Critical Value/emergent results were called by telephone at the time of interpretation on 04/25/2014 at 1:14 am to Dr. Luisa DagoEverett Moding, who verbally acknowledged these results.   Electronically Signed    By: Gennette Pachris  Mattern M.D.   On: 04/25/2014 01:14   Dg Chest Port 1 View  04/27/2014   CLINICAL DATA:  Pneumonia.  EXAM: PORTABLE CHEST - 1 VIEW  COMPARISON:  04/26/2014 and 04/25/2014  FINDINGS: Bilateral pleural effusions have diminished. Pulmonary vascularity is normal. Cardiac silhouette is slightly prominent but the patient has a pericardial effusion as demonstrated on CT scan of 04/25/2014. No discrete infiltrates or pulmonary edema.  IMPRESSION: Decreasing bilateral pleural effusions.   Electronically Signed   By: Geanie CooleyJim  Maxwell M.D.   On: 04/27/2014 07:54   Dg Chest Port 1 View  04/26/2014   CLINICAL DATA:  Pneumonia  EXAM: PORTABLE  CHEST - 1 VIEW  COMPARISON:  04/25/2014  FINDINGS: Cardiomegaly. Again noted small bilateral pleural effusion with bilateral basilar atelectasis or infiltrate. No convincing pulmonary edema.  IMPRESSION: Cardiomegaly. Again noted small bilateral pleural effusion with bilateral basilar atelectasis or infiltrate. No convincing pulmonary edema.   Electronically Signed   By: Natasha Mead M.D.   On: 04/26/2014 13:12   Dg Chest Port 1 View  04/25/2014   CLINICAL DATA:  Evaluate pneumonia  EXAM: PORTABLE CHEST - 1 VIEW  COMPARISON:  04/25/2014  FINDINGS: Cardiomegaly is noted. No convincing pulmonary edema. Bilateral small pleural effusion with bilateral basilar atelectasis or infiltrate. 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  IMPRESSION: Cardiomegaly. No convincing pulmonary edema. Small bilateral pleural effusion with bilateral basilar atelectasis or infiltrate.   Electronically Signed   By: Natasha Mead M.D.   On: 04/25/2014 15:23   Dg Chest Port 1 View  04/23/2014   CLINICAL DATA:  Code sepsis. Acute onset of diffuse chest pain, shortness of breath and weakness. Initial encounter.  EXAM: PORTABLE CHEST - 1 VIEW  COMPARISON:  Chest radiograph performed 04/12/2014  FINDINGS: The lungs are well-aerated and clear. There is no evidence of focal  opacification, pleural effusion or pneumothorax.  The cardiomediastinal silhouette is borderline enlarged. No acute osseous abnormalities are seen. Sclerotic change within the right humeral head and neck likely reflects a remote bone infarct.  IMPRESSION: No acute cardiopulmonary process seen. Borderline cardiomegaly noted.   Electronically Signed   By: Roanna Raider M.D.   On: 04/23/2014 21:30    2D Echo:  04/25/14: Left ventricle: The cavity size was normal. Systolic function was normal. The estimated ejection fraction was in the range of 55% to 60%. Wall motion was normal; there were no regional wall motion abnormalities. - Pericardium, extracardiac: The MV inflow velocity variation with respiration is difficult to assess due to patients irregular and tachycardic heart rhythm. There RA is not adequately visualized to comment on RA inversion. There is thickening of the pericardium overlying the RV free wall with no obvious RV collapse. The IVC is dilated. There is no obvious evidence of tamponade and again, the MV inflow velocity is not accurate in setting of significant tacnycardia with irreulgar rhythm. A small, free-flowing pericardial effusion was identified circumferential to the heart. The fluid had no internal echoes.  04/30/14: Compared to the prior study on 04/25/14, there has been interval resolution in the pericardial effusion. Pericardial thickening is again noted.    Cardiac Cath:   Admission HPI:   TYRONE BALASH is a 80 y.o. female  with past medical history of hypertension, asthma, hyperlipidemia, and generalized deconditioning, gastric ulcer, who presents with cough, chest pain and fever.  The patient does not talk much and is very difficult to get accurate history. I obtained medical history by having called her daughter. Per her daughter, patient was recently hospitalized and treated for pneumonia (per dr. Vivien Rossetti note, patient was initially treated for HCAP with  cefepime and vancomycin, but then switched to CAP coverage). She was discharged home with continuation of oral Augmentin. Patient has been compliant to her medications, but her symptoms persisted. She still has productive cough with greenish sputum production. She also has chest pain and fever with temperature 100.0 at home. The symptoms has been progressively getting worse. She was brought back to the hospital for further evaluation and treatment. She does not have nausea, vomiting, diarrhea, no rashes. Patient was found to have leukocytosis with WBC 13.5  and a fever with a temperature 100.0. Her portal chest x-ray is negative for pneumonia. She is admitted to inpatient for further evaluation and treatment.    Hospital Course by problem list: Principal Problem:   HCAP (healthcare-associated pneumonia) Active Problems:   HTN (hypertension)   HLD (hyperlipidemia)   Asthma   Sepsis   Viral pneumonitis   A-fib   Pericardial effusion without cardiac tamponade   Abnormality of gait   Acute Respiratory distress -improved. No longer coughing. More interactive, Breathing fine. unclear etiology but likely viral pneumonitis.  - initially treated for HCAP. CXR not impressive for CAP. But was on vanc+zosyn+azithromycin --> levaquin. This is not likely bacterial - d/ced abx. This is likely a viral infection even though resp viral panel negative, viral course likely getting better  CXR shows bilteral pleural effusion which is improving. CT angio showed no PE but showed small pericardial effusion, re-evaluated by 2d ECHO, without tamponade. - procalcitonin normal.negative Mycoplasma pending, RSV panel normal  Repeat echo shows resolution of previous small pericardial effusion likely from viral etiology. Cards ok for discharge today.   Movement disorder - likely PD, with some cognitive impairment  -resting tremor, bradykinesia, rigidity.  - per daughter, no hx of it or diagnosis in the past but patient had  been wheelchair bound and depended on daughter for ADL's for last ~2 years. She was able to stand up at home per daughter. She is likely severely deconditioned. neurology consulted and they don't recommend treating her for parkinson. Reason for not treating is because her mobility limitation may be due to her deconditioning which will not improve with parkinson meds. Also, even if she is able to mobile, with her congnitive impairment that may be harmful for her and put her in risk for hurting herself. We have decided not to treat her initially. We did however start sinemet 20/100 BID per daughter's request for her tremor. It seems to be helping her somewhat in terms of being more responsive and having less tremor. Will continue this for now. It can be increased slowly in the future.   Afib with RVR - chads2vasc 4- developed afib with RVR likely from respiratory failure. Was transferred to CCU to control her rate where she received amio drip and then switched to Po amio as she converted to NSR.  - CT angio no PE, but showed pericardial effusion as noted above.  - treating underlying resp problem would be the key which is improving.  - now on NSR. Will d/c amio on discharge. - appreciate card recs: no anti-coag currently with the pericardial effusion recently. Will do asa 81mg  daily.     Discharge Vitals:   BP 102/54  Pulse 62  Temp(Src) 99 F (37.2 C) (Axillary)  Resp 16  Ht 5\' 7"  (1.702 m)  Wt 125 lb 7.1 oz (56.9 kg)  BMI 19.64 kg/m2  SpO2 98%  Discharge Labs:  Results for orders placed during the hospital encounter of 04/23/14 (from the past 24 hour(s))  GLUCOSE, CAPILLARY     Status: Abnormal   Collection Time    04/30/14  4:43 PM      Result Value Ref Range   Glucose-Capillary 115 (*) 70 - 99 mg/dL   Comment 1 Documented in Chart     Comment 2 Notify RN    GLUCOSE, CAPILLARY     Status: Abnormal   Collection Time    04/30/14  8:53 PM      Result Value Ref Range  Glucose-Capillary 105 (*) 70 - 99 mg/dL   Comment 1 Documented in Chart     Comment 2 Notify RN    GLUCOSE, CAPILLARY     Status: Abnormal   Collection Time    05/01/14  6:10 AM      Result Value Ref Range   Glucose-Capillary 116 (*) 70 - 99 mg/dL   Comment 1 Notify RN     Comment 2 Documented in Chart    GLUCOSE, CAPILLARY     Status: Abnormal   Collection Time    05/01/14 11:17 AM      Result Value Ref Range   Glucose-Capillary 123 (*) 70 - 99 mg/dL    Signed: Hyacinth Meeker, MD 05/01/2014, 1:59 PM    Services Ordered on Discharge:  Equipment Ordered on Discharge:

## 2014-05-01 LAB — CULTURE, BLOOD (ROUTINE X 2)
CULTURE: NO GROWTH
Culture: NO GROWTH

## 2014-05-01 LAB — GLUCOSE, CAPILLARY
Glucose-Capillary: 116 mg/dL — ABNORMAL HIGH (ref 70–99)
Glucose-Capillary: 123 mg/dL — ABNORMAL HIGH (ref 70–99)

## 2014-05-01 NOTE — Progress Notes (Signed)
[facility], patient will be transported via EMS to SNF packet sent with patietn. Sintia Mckissic, Randall AnKristin Jessup RN

## 2014-05-01 NOTE — Progress Notes (Addendum)
Subjective: Continuing to improve. Breathing fine, not coughing, denies any complaints. Has right hand tremor still going.   Objective: Vital signs in last 24 hours: Filed Vitals:   04/30/14 1952 04/30/14 2048 05/01/14 0405 05/01/14 0831  BP:  137/90 102/54   Pulse:  69 62   Temp:  98.4 F (36.9 C) 99 F (37.2 C)   TempSrc:  Axillary Axillary   Resp:  18 16   Height:      Weight:   125 lb 7.1 oz (56.9 kg)   SpO2: 96% 96% 100% 98%   Weight change: 0 lb (0 kg)  Intake/Output Summary (Last 24 hours) at 05/01/14 1058 Last data filed at 04/30/14 2050  Gross per 24 hour  Intake    720 ml  Output    100 ml  Net    620 ml   Vitals reviewed. General: resting in bed, NAD HEENT: PERRL, EOMI, no scleral icterus Cardiac: RRR, no rubs, murmurs or gallops Pulm: clear to auscultation bilaterally, no wheezes, rales, or rhonchi Abd: soft, nontender, nondistended, BS present Ext: warm and well perfused, no pedal edema Neuro: alert and oriented to person and place and time today, cannot do full neuro exam as patient doesn't follow commands properly. Has resting tremor on right hand but less than prior encounters. Has contracted flexion on the left arm. , rigidity, cog wheeling, and bradykinesia.   Lab Results: Basic Metabolic Panel:  Recent Labs Lab 04/24/14 2206 04/25/14 0700  04/28/14 0230 04/29/14 0337  NA 137 137  < > 139 136*  K 4.1 4.4  < > 4.3 5.0  CL 103 106  < > 98 99  CO2 22 19  < > 26 24  GLUCOSE 147* 154*  < > 109* 109*  BUN 12 11  < > 16 19  CREATININE 0.83 0.72  < > 0.94 0.91  CALCIUM 9.2 9.2  < > 10.7* 10.1  MG 1.8 2.0  --   --   --   < > = values in this interval not displayed. Liver Function Tests:  Recent Labs Lab 04/24/14 2206 04/26/14 0340  AST 15 18  ALT 6 6  ALKPHOS 81 65  BILITOT 0.3 0.2*  PROT 6.4 5.6*  ALBUMIN 2.5* 2.0*   No results found for this basename: LIPASE, AMYLASE,  in the last 168 hours No results found for this basename:  AMMONIA,  in the last 168 hours CBC:  Recent Labs Lab 04/25/14 1530 04/28/14 0230 04/28/14 1932  WBC 10.4 6.8 5.4  NEUTROABS 7.8*  --   --   HGB 9.0* 9.8* 9.4*  HCT 27.5* 29.3* 28.6*  MCV 92.9 91.0 92.0  PLT 283 415* 386   Cardiac Enzymes:  Recent Labs Lab 04/24/14 2206 04/25/14 1530  CKTOTAL  --  30  TROPONINI <0.30  --    BNP: No results found for this basename: PROBNP,  in the last 168 hours D-Dimer:  Recent Labs Lab 04/24/14 2206  DDIMER 2.19*   CBG:  Recent Labs Lab 04/29/14 2035 04/30/14 0610 04/30/14 1113 04/30/14 1643 04/30/14 2053 05/01/14 0610  GLUCAP 126* 116* 150* 115* 105* 116*   Hemoglobin A1C: No results found for this basename: HGBA1C,  in the last 168 hours Fasting Lipid Panel: No results found for this basename: CHOL, HDL, LDLCALC, TRIG, CHOLHDL, LDLDIRECT,  in the last 168 hours Thyroid Function Tests:  Recent Labs Lab 04/24/14 2206  TSH 1.890  FREET4 1.00   Coagulation: No results found  for this basename: LABPROT, INR,  in the last 168 hours Anemia Panel: No results found for this basename: VITAMINB12, FOLATE, FERRITIN, TIBC, IRON, RETICCTPCT,  in the last 168 hours Urine Drug Screen: Drugs of Abuse  No results found for this basename: labopia,  cocainscrnur,  labbenz,  amphetmu,  thcu,  labbarb    Alcohol Level: No results found for this basename: ETH,  in the last 168 hours Urinalysis: No results found for this basename: COLORURINE, APPERANCEUR, LABSPEC, PHURINE, GLUCOSEU, HGBUR, BILIRUBINUR, KETONESUR, PROTEINUR, UROBILINOGEN, NITRITE, LEUKOCYTESUR,  in the last 168 hours Misc. Labs:  Micro Results: Recent Results (from the past 240 hour(s))  CULTURE, BLOOD (ROUTINE X 2)     Status: None   Collection Time    04/23/14  8:42 PM      Result Value Ref Range Status   Specimen Description BLOOD RIGHT FOREARM   Final   Special Requests BOTTLES DRAWN AEROBIC AND ANAEROBIC 5CC   Final   Culture  Setup Time     Final    Value: 04/24/2014 01:58     Performed at [facility]   Culture     Final   Value: NO GROWTH 5 DAYS     Performed at [facility]   Report Status 04/30/2014 FINAL   Final  CULTURE, BLOOD (ROUTINE X 2)     Status: None   Collection Time    04/23/14  8:49 PM      Result Value Ref Range Status   Specimen Description BLOOD HAND RIGHT   Final   Special Requests BOTTLES DRAWN AEROBIC AND ANAEROBIC 5CCBLUE 3CCRED   Final   Culture  Setup Time     Final   Value: 04/24/2014 02:04     Performed at [facility]   Culture     Final   Value: NO GROWTH 5 DAYS     Performed at [facility]   Report Status 04/30/2014 FINAL   Final  URINE CULTURE     Status: None   Collection Time    04/23/14  8:53 PM      Result Value Ref Range Status   Specimen Description URINE, CATHETERIZED   Final   Special Requests NONE   Final   Culture  Setup Time     Final   Value: 04/24/2014 08:59     Performed at [facility]     Final   Value: NO GROWTH     Performed at [facility]   Culture     Final   Value: NO GROWTH     Performed at [facility]   Report Status 04/25/2014 FINAL   Final  RESPIRATORY VIRUS PANEL     Status: None   Collection Time    04/24/14  2:35 PM      Result Value Ref Range Status   Source - RVPAN NASAL SWAB   Corrected   Comment: CORRECTED ON 10/09 AT 1144: PREVIOUSLY REPORTED AS NASAL SWAB   Respiratory Syncytial Virus A NOT DETECTED   Final   Respiratory Syncytial Virus B NOT DETECTED   Final   Influenza A NOT DETECTED   Final   Influenza B NOT DETECTED   Final   Parainfluenza 1 NOT DETECTED   Final   Parainfluenza 2 NOT DETECTED   Final   Parainfluenza 3 NOT DETECTED   Final   Metapneumovirus NOT DETECTED   Final   Rhinovirus NOT DETECTED  Final   Adenovirus NOT DETECTED   Final   Influenza A H1 NOT DETECTED   Final   Influenza A H3 NOT DETECTED   Final   Comment: (NOTE)           Normal Reference Range  for each Analyte: NOT DETECTED     Testing performed using the Luminex xTAG Respiratory Viral Panel test     kit.     The analytical performance characteristics of this assay have been     determined by [facility].  The modifications have not been     cleared or approved by the FDA. This assay has been validated pursuant     to the CLIA regulations and is used for clinical purposes.     Performed at [facility], BLOOD (ROUTINE X 2)     Status: None   Collection Time    04/24/14 10:06 PM      Result Value Ref Range Status   Specimen Description BLOOD LEFT ARM   Final   Special Requests BOTTLES DRAWN AEROBIC ONLY 10CC   Final   Culture  Setup Time     Final   Value: 04/25/2014 01:53     Performed at [facility]   Culture     Final   Value: NO GROWTH 5 DAYS     Performed at [facility]   Report Status 05/01/2014 FINAL   Final  CULTURE, BLOOD (ROUTINE X 2)     Status: None   Collection Time    04/24/14 10:15 PM      Result Value Ref Range Status   Specimen Description BLOOD LEFT FOREARM   Final   Special Requests     Final   Value: BOTTLES DRAWN AEROBIC AND ANAEROBIC 10CC AER,5CC ANA   Culture  Setup Time     Final   Value: 04/25/2014 01:52     Performed at [facility]   Culture     Final   Value: NO GROWTH 5 DAYS     Performed at [facility]   Report Status 05/01/2014 FINAL   Final  MRSA PCR SCREENING     Status: None   Collection Time    04/25/14  3:00 AM      Result Value Ref Range Status   MRSA by PCR NEGATIVE  NEGATIVE Final   Comment:            The GeneXpert MRSA Assay (FDA     approved for NASAL specimens     only), is one component of a     comprehensive MRSA colonization     surveillance program. It is not     intended to diagnose MRSA     infection nor to guide or     monitor treatment for     MRSA infections.   Studies/Results: No results found. Medications: I have reviewed the patient's  current medications. Scheduled Meds: . acidophilus  1 capsule Oral Daily  . amiodarone  200 mg Oral BID  . antiseptic oral rinse  7 mL Mouth Rinse BID  . arformoterol  15 mcg Nebulization BID  . aspirin EC  81 mg Oral Daily  . budesonide  0.5 mg Nebulization BID  . calcium-vitamin D  1 tablet Oral BID WC  . Carbidopa-Levodopa ER  1 tablet Oral BID  . docusate sodium  200 mg Oral QHS  . heparin  5,000 Units Subcutaneous 3 times per day  .  insulin aspart  0-9 Units Subcutaneous TID WC  . multivitamin with minerals  1 tablet Oral Daily  . pantoprazole  40 mg Oral Daily  . potassium chloride  40 mEq Oral BID  . vitamin B-12  1,000 mcg Oral Q breakfast   Continuous Infusions:  PRN Meds:.acetaminophen, albuterol, guaiFENesin, HYDROcodone-homatropine, naphazoline, polyethylene glycol Assessment/Plan: Principal Problem:   HCAP (healthcare-associated pneumonia) Active Problems:   HTN (hypertension)   HLD (hyperlipidemia)   Asthma   Sepsis   Viral pneumonitis   A-fib   Pericardial effusion without cardiac tamponade   Abnormality of gait  Acute Respiratory distress -improved. No longer coughing. More interactive, Breathing fine. unclear etiology but likely viral pneumonitis.  - initially treated for HCAP. CXR not impressive for CAP. But was on vanc+zosyn+azithromycin --> levaquin. This is not likely bacterial - d/ced abx. This is likely a viral infection even though resp viral panel negative, viral course likely getting better  Repeat echo yesterday shows resolution of previous small pericardial effusion likely from viral etiology. Cards ok for discharge with follow up outpatient.  Movement disorder - likely PD, with some cognitive impairment -resting tremor, bradykinesia, rigidity.   - per daughter, no hx of it or diagnosis in the past but patient had been wheelchair bound but also able to get up and stand sometimes. Depended on daughter for ADL's for last ~2 years.   She may improve  with sinemet so we started sinemet 20/100. Our goal is to make her ambulatory. It might not improve her cognitive status.    Afib with RVR - chads2vasc 4- now on NSR. Likely 2/2 to resp distress.  - was on amio drip, now on amio po. d/c on discharge. - appreciate card recs: no anti-coag currently with the pericardial effusion recently. Will do asa 91m daily.  Dispo: Disposition is deferred at this time, awaiting improvement of current medical problems.  Anticipated discharge in approximately today.  The patient does have a current PCP (Christain Sacramento MD) and [facility].  The patient does have transportation limitations that hinder transportation to clinic appointments.  .Services Needed at time of discharge: Y = Yes, Blank = No PT: Y  OT:   RN:   Equipment: Hoyer lift  Other:     LOS: 8 days   TDellia Nims MD 05/01/2014, 10:58 AM

## 2014-05-01 NOTE — Discharge Summary (Signed)
Medicine attending: I personally examined this patient on the day of discharge and I concur with the evaluation and discharge plan as recorded by resident physician Dr. Hyacinth Meekerasrif Ahmed.

## 2014-05-01 NOTE — Progress Notes (Signed)
Patient ID: Wendy RamsRuth K Steinert, female   DOB: 1924-01-29, 80 y.o.   MRN: [id number]    Subjective: Improved  RUE tremor better   Objective: Vital signs in last 24 hours: Temp:  [98.2 F (36.8 C)-99 F (37.2 C)] 99 F (37.2 C) (10/13 0405) Pulse Rate:  [62-74] 62 (10/13 0405) Resp:  [16-18] 16 (10/13 0405) BP: (102-137)/(54-90) 102/54 mmHg (10/13 0405) SpO2:  [96 %-100 %] 98 % (10/13 0831) Weight:  [125 lb 7.1 oz (56.9 kg)] 125 lb 7.1 oz (56.9 kg) (10/13 0405) Last BM Date: 04/30/14  Intake/Output from previous day: 10/12 0701 - 10/13 0700 In: 840 [P.O.:840] Out: 100 [Urine:100] Intake/Output this shift:    Medications Current Facility-Administered Medications  Medication Dose Route Frequency Provider Last Rate Last Dose  . acetaminophen (TYLENOL) tablet 650 mg  650 mg Oral Q6H PRN Levert FeinsteinJames M Granfortuna, MD   650 mg at 04/29/14 2237  . acidophilus (RISAQUAD) capsule 1 capsule  1 capsule Oral Daily Lorretta HarpXilin Niu, MD   1 capsule at 04/30/14 1136  . albuterol (PROVENTIL) (2.5 MG/3ML) 0.083% nebulizer solution 2.5 mg  2.5 mg Nebulization Q6H PRN Levert FeinsteinJames M Granfortuna, MD      . amiodarone (PACERONE) tablet 200 mg  200 mg Oral BID Laurey Moralealton S McLean, MD   200 mg at 04/30/14 2238  . antiseptic oral rinse (CPC / CETYLPYRIDINIUM CHLORIDE 0.05%) solution 7 mL  7 mL Mouth Rinse BID Rahul P Desai, PA-C   7 mL at 04/30/14 2200  . arformoterol (BROVANA) nebulizer solution 15 mcg  15 mcg Nebulization BID Rahul P Desai, PA-C   15 mcg at 05/01/14 0831  . aspirin EC tablet 81 mg  81 mg Oral Daily Lorretta HarpXilin Niu, MD   81 mg at 04/30/14 1136  . budesonide (PULMICORT) nebulizer solution 0.5 mg  0.5 mg Nebulization BID Rahul P Desai, PA-C   0.5 mg at 05/01/14 0831  . calcium-vitamin D (OSCAL WITH D) 500-200 MG-UNIT per tablet 1 tablet  1 tablet Oral BID WC Lorretta HarpXilin Niu, MD   1 tablet at 05/01/14 0915  . Carbidopa-Levodopa ER (SINEMET CR) 25-100 MG tablet controlled release 1 tablet  1 tablet Oral BID Hyacinth Meekerasrif Ahmed, MD   1  tablet at 04/30/14 2240  . docusate sodium (COLACE) capsule 200 mg  200 mg Oral QHS Lorretta HarpXilin Niu, MD   200 mg at 04/30/14 2241  . guaiFENesin (MUCINEX) 12 hr tablet 600 mg  600 mg Oral BID PRN Lorretta HarpXilin Niu, MD   600 mg at 04/26/14 0207  . heparin injection 5,000 Units  5,000 Units Subcutaneous 3 times per day Lorretta HarpXilin Niu, MD   5,000 Units at 04/30/14 2248  . HYDROcodone-homatropine (HYCODAN) 5-1.5 MG/5ML syrup 5 mL  5 mL Oral Q6H PRN Lorretta HarpXilin Niu, MD   5 mL at 04/30/14 1644  . insulin aspart (novoLOG) injection 0-9 Units  0-9 Units Subcutaneous TID WC Lorretta HarpXilin Niu, MD   1 Units at 04/30/14 1240  . multivitamin with minerals tablet 1 tablet  1 tablet Oral Daily Lorretta HarpXilin Niu, MD   1 tablet at 04/30/14 1135  . naphazoline (NAPHCON) 0.1 % ophthalmic solution 1 drop  1 drop Both Eyes QID PRN Lorretta HarpXilin Niu, MD   1 drop at 04/27/14 0537  . pantoprazole (PROTONIX) EC tablet 40 mg  40 mg Oral Daily Lorretta HarpXilin Niu, MD   40 mg at 04/30/14 1135  . polyethylene glycol (MIRALAX / GLYCOLAX) packet 17 g  17 g Oral Daily PRN Lorretta HarpXilin Niu, MD      .  potassium chloride SA (K-DUR,KLOR-CON) CR tablet 40 mEq  40 mEq Oral BID Hyacinth Meekerasrif Ahmed, MD   40 mEq at 04/30/14 2235  . vitamin B-12 (CYANOCOBALAMIN) tablet 1,000 mcg  1,000 mcg Oral Q breakfast Lorretta HarpXilin Niu, MD   1,000 mcg at 05/01/14 0915    PE: Affect appropriate Frail elderly female  HEENT: normal Neck supple with no adenopathy JVP normal no bruits no thyromegaly Lungs clear with no wheezing and good diaphragmatic motion Heart:  S1/S2 SEM  murmur, no rub, gallop or click PMI normal Abdomen: benighn, BS positve, no tenderness, no AAA no bruit.  No HSM or HJR Distal pulses intact with no bruits No edema Neuro non-focal parkinsons improved RUE tremor  Skin warm and dry No muscular weakness   Lab Results:   Recent Labs  04/28/14 1932  WBC 5.4  HGB 9.4*  HCT 28.6*  PLT 386   BMET  Recent Labs  04/29/14 0337  NA 136*  K 5.0  CL 99  CO2 24  GLUCOSE 109*  BUN 19    CREATININE 0.91  CALCIUM 10.1      Assessment/Plan  12F with HTN, HLD, DM, prior bleeding ulcers, ? Parkinson's disease who is admitted to the hospital after failed treatment for pneumonia. Cardiology was consulted for new onset atrial fibrillation in the setting of spiking fevers.   Principal Problem:   HCAP (healthcare-associated pneumonia) Active Problems:   HTN (hypertension)   HLD (hyperlipidemia)   Asthma   Sepsis   Viral pneumonitis   A-fib   Pericardial effusion without cardiac tamponade   Abnormality of gait   Plan:   Maintaining NSR.  On Amiodarone 200mg  PO BID.  The plan is to stop the amio at DC.  Not anticoagulating due to effusion.  CT 10/6 no PE. + ?infiltrate. Moderate pericardial effusion.   Echo 04/25/14: EF 55-60%. There is small-moderate circumferential effusion posterior > anterior.    Outpatient f/u with PA Dayna Dunn to follow rhythm and effusion primary cardiologist is Dr Marca Anconaalton McLean      LOS: 8 days    Charlton HawsPeter Tiger Spieker

## 2014-05-17 ENCOUNTER — Encounter: Payer: Medicare Other | Admitting: Physician Assistant

## 2014-05-21 ENCOUNTER — Encounter: Payer: Self-pay | Admitting: Internal Medicine

## 2014-05-21 ENCOUNTER — Other Ambulatory Visit: Payer: Self-pay | Admitting: *Deleted

## 2014-05-21 ENCOUNTER — Non-Acute Institutional Stay (SKILLED_NURSING_FACILITY): Payer: Medicare Other | Admitting: Internal Medicine

## 2014-05-21 DIAGNOSIS — F411 Generalized anxiety disorder: Secondary | ICD-10-CM | POA: Insufficient documentation

## 2014-05-21 DIAGNOSIS — R251 Tremor, unspecified: Secondary | ICD-10-CM

## 2014-05-21 DIAGNOSIS — K5900 Constipation, unspecified: Secondary | ICD-10-CM | POA: Insufficient documentation

## 2014-05-21 DIAGNOSIS — K219 Gastro-esophageal reflux disease without esophagitis: Secondary | ICD-10-CM

## 2014-05-21 DIAGNOSIS — R5381 Other malaise: Secondary | ICD-10-CM

## 2014-05-21 DIAGNOSIS — R1310 Dysphagia, unspecified: Secondary | ICD-10-CM

## 2014-05-21 DIAGNOSIS — J45909 Unspecified asthma, uncomplicated: Secondary | ICD-10-CM

## 2014-05-21 DIAGNOSIS — R4189 Other symptoms and signs involving cognitive functions and awareness: Secondary | ICD-10-CM

## 2014-05-21 MED ORDER — ALPRAZOLAM 0.25 MG PO TABS: ORAL_TABLET | ORAL | Status: DC

## 2014-05-21 NOTE — Progress Notes (Signed)
Patient ID: Wendy Vasquez, female   DOB: 1924-02-09, 80 y.o.   MRN: [id number]     [facility]  PCP: Pamelia Hoit, MD  Code Status: full code  Allergies  Allergen Reactions  . Ciprofloxacin Hives  . Sulphur [Sulfur] Anaphylaxis    Chief Complaint: new admission  HPI:  80 y/o female patient is here for short term rehabilitation post hospital admission from 04/23/14-05/01/14 and in [facility] for a day. She had left for home following 1 day stay in rehab but given her deconditioning and weakness, is back in SNF for STR. She was in the hospital with acute respiratory distress thought to be from viral pneumonitis. She was initially treated for pneumonia with vanc, zosyn and z pack and later switched to levaquin. Her cxr had shown effusion following which she had echocardiogram with ? Pericardial effusion which on repeat echocardiogram had resolved and tamponade had been ruled out. She was started on sinemet in hospital with her tremors and concern for parkinson. This appears to have been discontinued by patient. She also had episode of afib with RVR in hospital and cardiology was consulted. She was put on amiodarone drip and later switched to po amiodarone. She converted to sinus and amiodarone was discontinued. Anticoagulation was held with recent pericardial effusion. She was put on baby aspirin She has past medical history of HTN, asthma, dm type 2, HLD, gastric ulcer. She was seen in her room with her daughter at bedside. Patient is not communicating with me but communicates few words with her daughter. She is in no distress. As per daughter her sinemet was held as pt was hallucinating with it. Goal is for patient to go home with daughter if strength improves else be transferred to a SNF near home for long term care. As per daughter, this is a new environment for patient and she takes time to open up. She denies any concerns for her mother besides  weakness and getting tired easily. She mentions that her memory is good.   Review of Systems: unable to obtain from patient. As per daughter, no new skin concern or behavioral concern. No new concern from staff   Past Medical History  Diagnosis Date  . Hypertension   . HLD (hyperlipidemia)   . Bleeding ulcer     requiring transfusions  . H/O hiatal hernia   . History of blood transfusion     "related to bleeding ulcers"  . Asthma     "a touch"  . Pneumonia X 1  . Diabetes mellitus without complication     "diet controlled"  . GERD (gastroesophageal reflux disease)   . Migraine     hx  . Daily headache     "last couple days" (04/24/2014)  . Arthritis     "all over"   Past Surgical History  Procedure Laterality Date  . Total knee arthroplasty Bilateral   . Joint replacement    . Cataract extraction  "years ago"    "don't know which side; don't know if they put lens in"  . Dilation and curettage of uterus      "after she lost a child, I'd assume"   Social History:   reports that she has quit smoking. Her smoking use included Cigarettes. She smoked 0.00 packs per day. She has never used smokeless tobacco. She reports that she does not drink alcohol or use illicit drugs.  Family History  Problem Relation Age of Onset  . Colon  cancer Mother     Medications: Patient's Medications  New Prescriptions   No medications on file  Previous Medications   ACETAMINOPHEN (TYLENOL) 500 MG TABLET    Take 1,000 mg by mouth 4 (four) times daily. Take everyday per daughter   ADVAIR DISKUS 250-50 MCG/DOSE AEPB    Inhale 1 puff into the lungs 2 (two) times daily.    ALPRAZOLAM (XANAX) 0.25 MG TABLET    Take 0.25 mg by mouth at bedtime.    ASPIRIN 81 MG TABLET    Take 81 mg by mouth daily.   CALCIUM-VITAMIN D (OSCAL WITH D) 500-200 MG-UNIT PER TABLET    Take 1 tablet by mouth 2 (two) times daily.   CRANBERRY 1000 MG CAPS    Take 2,000 mg by mouth daily with breakfast.    DOCUSATE SODIUM  (COLACE) 100 MG CAPSULE    Take 200 mg by mouth at bedtime.   ESOMEPRAZOLE (NEXIUM) 40 MG CAPSULE    Take 40 mg by mouth at bedtime.    GUAIFENESIN (MUCINEX) 600 MG 12 HR TABLET    Take 600 mg by mouth 2 (two) times daily as needed for cough or to loosen phlegm.   MULTIPLE VITAMIN (MULTIVITAMIN WITH MINERALS) TABS TABLET    Take 1 tablet by mouth daily.   POLYETHYLENE GLYCOL (MIRALAX / GLYCOLAX) PACKET    Take 17 g by mouth daily as needed for mild constipation.   POTASSIUM CHLORIDE SA (K-DUR,KLOR-CON) 20 MEQ TABLET    Take 1 tablet (20 mEq total) by mouth once.   PROBIOTIC PRODUCT (PROBIOTIC PO)    Take 1 capsule by mouth daily.   TETRAHYDROZOLINE (VISINE) 0.05 % OPHTHALMIC SOLUTION    Place 1 drop into both eyes daily as needed (for dry eyes).   VITAMIN B-12 (CYANOCOBALAMIN) 1000 MCG TABLET    Take 1,000 mcg by mouth daily with breakfast.   Modified Medications   No medications on file  Discontinued Medications   CARBIDOPA-LEVODOPA ER (SINEMET CR) 25-100 MG TABLET CONTROLLED RELEASE    Take 1 tablet by mouth 2 (two) times daily.   HYDROCODONE-HOMATROPINE (HYCODAN) 5-1.5 MG/5ML SYRUP    Take 5 mLs by mouth every 6 (six) hours as needed for cough.     Physical Exam: Filed Vitals:   05/21/14 1049  BP: 134/79  Pulse: 82  Temp: 96.4 F (35.8 C)  Resp: 18  SpO2: 95%    General- elderly female in no acute distress, frail, ill appearing Head- atraumatic, normocephalic Eyes- PERRLA, EOMI, no pallor, no icterus, no discharge Neck- no lymphadenopathy Throat- moist mucus membrane Nose- normal nasal mucosa, no maxillary or frontal sinus tenderness Cardiovascular- normal s1,s2, no murmurs Respiratory- bilateral decreased air entry at bases, patient not taking deep breaths when asked making lung exam difficult, no use of accessory muscles Abdomen- bowel sounds present, soft, non tender Musculoskeletal- has arthritis changes in her fingers, no leg edema, has rigidity on exam and holding her  legs in extension Neurological- has resting tremor in right hand, unable to assess further with pt not participating Skin- warm and dry Psychiatry- unable to assess    Labs reviewed: Basic Metabolic Panel:  Recent Labs  04/54/09 2206 04/25/14 0700  04/27/14 0300 04/28/14 0230 04/29/14 0337  NA 137 137  < > 140 139 136*  K 4.1 4.4  < > 3.3* 4.3 5.0  CL 103 106  < > 101 98 99  CO2 22 19  < > 21 26 24   GLUCOSE 147* 154*  < >  92 109* 109*  BUN 12 11  < > 10 16 19   CREATININE 0.83 0.72  < > 0.94 0.94 0.91  CALCIUM 9.2 9.2  < > 9.5 10.7* 10.1  MG 1.8 2.0  --   --   --   --   < > = values in this interval not displayed. Liver Function Tests:  Recent Labs  04/23/14 2047 04/24/14 2206 04/26/14 0340  AST 20 15 18   ALT 6 6 6   ALKPHOS 113 81 65  BILITOT 0.4 0.3 0.2*  PROT 7.5 6.4 5.6*  ALBUMIN 3.1* 2.5* 2.0*   No results for input(s): LIPASE, AMYLASE in the last 8760 hours. No results for input(s): AMMONIA in the last 8760 hours. CBC:  Recent Labs  04/12/14 0600 04/23/14 2047  04/25/14 1530 04/28/14 0230 04/28/14 1932  WBC 10.5 13.5*  < > 10.4 6.8 5.4  NEUTROABS 7.7 11.6*  --  7.8*  --   --   HGB 10.1* 12.0  13.9  < > 9.0* 9.8* 9.4*  HCT 30.4* 36.5  41.0  < > 27.5* 29.3* 28.6*  MCV 93.3 94.1  < > 92.9 91.0 92.0  PLT 310 383  < > 283 415* 386  < > = values in this interval not displayed. Cardiac Enzymes:  Recent Labs  04/11/14 1836 04/12/14 0136 04/24/14 2206 04/25/14 1530  CKTOTAL  --   --   --  30  TROPONINI <0.30 <0.30 <0.30  --    BNP: Invalid input(s): POCBNP CBG:  Recent Labs  04/30/14 2053 05/01/14 0610 05/01/14 1117  GLUCAP 105* 116* 123*    Radiological Exams: Dg Chest 1 View  04/11/2014   CLINICAL DATA:  Cough, weakness, confusion  EXAM: CHEST - 1 VIEW  COMPARISON:  04/06/2014  FINDINGS: Cardiomediastinal silhouette is stable. No pulmonary edema. Left basilar atelectasis or infiltrate. Stable sclerotic changes proximal right humerus.   IMPRESSION: Left basilar atelectasis or infiltrate.  No pulmonary edema.   Electronically Signed   By: Natasha Mead M.D.   On: 04/11/2014 09:42   Dg Chest 2 View  04/24/2014   CLINICAL DATA:  Patient with hospital acquired pneumonia  EXAM: CHEST  2 VIEW  COMPARISON:  Chest radiograph 04/23/2014  FINDINGS: Low lung volumes. Stable cardiac and mediastinal contours. Heterogeneous opacities right mid and lower lung. Minimal heterogeneous opacities left lung base. Small bilateral pleural effusions. High-riding right humeral head. Sclerotic lesion proximal right humerus may represent enchondroma are bone infarct. Thoracic spine degenerative change.  IMPRESSION: Low lung volumes.  Right mid and lower lung as well as left lower lung heterogeneous opacities which may represent scattered atelectasis or infection.   Electronically Signed   By: Annia Belt M.D.   On: 04/24/2014 09:11   Dg Chest 2 View  04/12/2014   ADDENDUM REPORT: 04/12/2014 14:05  ADDENDUM: The impression should read stable mild left base subsegmental atelectasis and/or infiltrate. Please ignore the word 'could' .   Electronically Signed   By: Maisie Fus  Register   On: 04/12/2014 14:05   04/12/2014   CLINICAL DATA:  Pneumonia.  EXAM: CHEST  2 VIEW  COMPARISON:  04/11/2014.  FINDINGS: Mediastinum and hilar structures are normal. Heart size normal. Persistent mild left basilar atelectasis and or infiltrate. Lungs otherwise clear. No pneumothorax. 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 Degenerative changes both shoulders and thoracic spine.  IMPRESSION: Stable mild left base subsegmental atelectasis and or infiltrate could  Electronically Signed: ByMaisie Fus  Register On: 04/12/2014  07:47   Dg Chest 2 View  04/06/2014   CLINICAL DATA:  Weakness  EXAM: CHEST  2 VIEW  COMPARISON:  November 09, 2013  FINDINGS: There is no edema or consolidation. Heart size and pulmonary vascularity are normal. No adenopathy. There is  degenerative change in the thoracic spine. There is a presumed bone infarct in the proximal right humerus, stable. There is evidence of superior migration of the right humeral head, suggesting chronic rotator cuff tear.  IMPRESSION: No edema or consolidation. Apparent bone infarct right proximal humerus. Evidence of chronic rotator cuff tear in the right shoulder. Bones are osteoporotic.   Electronically Signed   By: Bretta BangWilliam  Woodruff M.D.   On: 04/06/2014 09:26   Ct Head Wo Contrast  04/23/2014   CLINICAL DATA:  Generalized weakness, acute onset. Initial encounter.  EXAM: CT HEAD WITHOUT CONTRAST  TECHNIQUE: Contiguous axial images were obtained from the base of the skull through the vertex without intravenous contrast.  COMPARISON:  None.  FINDINGS: There is no evidence of acute infarction, or intra- or extra-axial hemorrhage on CT.  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 Prominence of the ventricles and sulci reflects mild to moderate cortical volume loss. Mild cerebellar atrophy is noted. Scattered periventricular white matter change likely reflects small vessel ischemic microangiopathy.  The brainstem and fourth ventricle are within normal limits. The basal ganglia are unremarkable in appearance. The cerebral hemispheres demonstrate grossly normal gray-white differentiation. No mass effect or midline shift is seen.  There is no evidence of fracture; visualized osseous structures are unremarkable in appearance. The visualized portions of the orbits are within normal limits. The paranasal sinuses and mastoid air cells are well-aerated. No significant soft tissue abnormalities are seen.  IMPRESSION: 1. No acute intracranial pathology seen on CT. 2. 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 Sudden death due to a colloid cyst at this age is rare, but would usually present with headaches and/or vomiting. 3. Mild to moderate cortical volume loss  and scattered small vessel ischemic microangiopathy.   Electronically Signed   By: Roanna RaiderJeffery  Chang M.D.   On: 04/23/2014 22:17   Ct Angio Chest Pe W/cm &/or Wo Cm  04/25/2014   CLINICAL DATA:  Shortness of breath.  Tachycardia.  Pneumonia.  EXAM: CT ANGIOGRAPHY CHEST WITH CONTRAST  TECHNIQUE: Multidetector CT imaging of the chest was performed using the standard protocol during bolus administration of intravenous contrast. Multiplanar CT image reconstructions and MIPs were obtained to evaluate the vascular anatomy.  CONTRAST:  100mL OMNIPAQUE IOHEXOL 350 MG/ML SOLN  COMPARISON:  Two-view chest x-ray 04/24/2014.  FINDINGS: Pulmonary arterial opacification is excellent. There are no focal filling defects to suggest pulmonary emboli. The study is mildly degraded by patient breathing motion. This limits evaluation of distal small vessels.  A moderate-sized pericardial effusion measures up to 19 mm. This appears to be low-density. The heart size is normal. Atherosclerotic calcifications are noted in the aortic arch.  Bilateral pleural effusions are present. Associated mild airspace disease is present. No significant upper lobe airspace disease is present.  Bone window is are unremarkable. Mild exaggerated kyphosis is present in the thoracic spine.  Review of the MIP images confirms the above findings.  IMPRESSION: 1. Moderate-sized pericardial effusion. Developing tamponade is considered. 2. No evidence for pulmonary embolus. 3. Small moderate bilateral pleural effusions with associated airspace disease, likely atelectasis. 4. Significant patient breathing motion limits evaluation of  distal small vessels. Critical Value/emergent results were called by telephone at the time of interpretation on 04/25/2014 at 1:14 am to Dr. Luisa Dago, who verbally acknowledged these results.   Electronically Signed   By: Gennette Pac M.D.   On: 04/25/2014 01:14   Dg Chest Port 1 View  04/27/2014   CLINICAL DATA:  Pneumonia.   EXAM: PORTABLE CHEST - 1 VIEW  COMPARISON:  04/26/2014 and 04/25/2014  FINDINGS: Bilateral pleural effusions have diminished. Pulmonary vascularity is normal. Cardiac silhouette is slightly prominent but the patient has a pericardial effusion as demonstrated on CT scan of 04/25/2014. No discrete infiltrates or pulmonary edema.  IMPRESSION: Decreasing bilateral pleural effusions.   Electronically Signed   By: Geanie Cooley M.D.   On: 04/27/2014 07:54   Dg Chest Port 1 View  04/26/2014   CLINICAL DATA:  Pneumonia  EXAM: PORTABLE CHEST - 1 VIEW  COMPARISON:  04/25/2014  FINDINGS: Cardiomegaly. Again noted small bilateral pleural effusion with bilateral basilar atelectasis or infiltrate. No convincing pulmonary edema.  IMPRESSION: Cardiomegaly. Again noted small bilateral pleural effusion with bilateral basilar atelectasis or infiltrate. No convincing pulmonary edema.   Electronically Signed   By: Natasha Mead M.D.   On: 04/26/2014 13:12   Dg Chest Port 1 View  04/25/2014   CLINICAL DATA:  Evaluate pneumonia  EXAM: PORTABLE CHEST - 1 VIEW  COMPARISON:  04/25/2014  FINDINGS: Cardiomegaly is noted. No convincing pulmonary edema. Bilateral small pleural effusion with bilateral basilar atelectasis or infiltrate. 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  IMPRESSION: Cardiomegaly. No convincing pulmonary edema. Small bilateral pleural effusion with bilateral basilar atelectasis or infiltrate.   Electronically Signed   By: Natasha Mead M.D.   On: 04/25/2014 15:23   Dg Chest Port 1 View  04/23/2014   CLINICAL DATA:  Code sepsis. Acute onset of diffuse chest pain, shortness of breath and weakness. Initial encounter.  EXAM: PORTABLE CHEST - 1 VIEW  COMPARISON:  Chest radiograph performed 04/12/2014  FINDINGS: The lungs are well-aerated and clear. There is no evidence of focal opacification, pleural effusion or pneumothorax.  The cardiomediastinal silhouette is borderline enlarged. No acute  osseous abnormalities are seen. Sclerotic change within the right humeral head and neck likely reflects a remote bone infarct.  IMPRESSION: No acute cardiopulmonary process seen. Borderline cardiomegaly noted.   Electronically Signed   By: Roanna Raider M.D.   On: 04/23/2014 21:30     2D Echo:  04/25/14: Left ventricle: The cavity size was normal. Systolic function was normal. The estimated ejection fraction was in the range of 55% to 60%. Wall motion was normal; there were no regional wall motion abnormalities. - Pericardium, extracardiac: The MV inflow velocity variation with respiration is difficult to assess due to patients irregular and tachycardic heart rhythm. There RA is not adequately visualized to comment on RA inversion. There is thickening of the pericardium overlying the RV free wall with no obvious RV collapse. The IVC is dilated. There is no obvious evidence of tamponade and again, the MV inflow velocity is not accurate in setting of significant tacnycardia with irreulgar rhythm. A small, free-flowing pericardial effusion was identified circumferential to the heart. The fluid had no internal echoes.  04/30/14: Compared to the prior study on 04/25/14, there has been interval resolution in the pericardial effusion. Pericardial thickening is again noted.   Assessment/Plan  Physical deconditioning With recent infection, memory impairment and generalized weakness. Will have her work with physical therapy and occupational therapy  team to help with gait training and muscle strengthening exercises.fall precautions. Skin care. Encourage to be out of bed. Will assess her progress in a week and have family meeting to review her goals of care. Goal at present is to return home if her strength improves. i see her being a long term care resident with decline anticipated  Tremors With rigidity showing features of parkinson. As per daughter sinemet discontinued with side effect.  Daughter does not want any med initiated for now  Cognitive impairment Unable to participate in history taking , ROS or exam. As per daughter her Carin Primrosememroy is intact but as per ospital records, dementia noted. With change in her environment will give her a week to adjust in faciltiy and then get a memory test on her to assess further. Likely has dementia with progression causing her overall decline  Dysphagia With her coughing between her meals and recent pneumonia, concern for aspiration. Will need her diet modified to dysphagia diet, will have SLP team assess on this. Strict aspiration precautions and full assistance with feeding  Asthma Continue advair  Anxiety Continue xanax for now  gerd Continue nexium current regimen  Constipation Continue colace and miralax, monitor bowel movement  Family/ staff Communication: reviewed care plan with patient and nursing supervisor   Goals of care: short term rehabilitation    Labs/tests ordered: cbc, cmp, prealbumin    Oneal GroutMAHIMA Marin Wisner, MD  [facility] [phone] (Monday-Friday 8 am - 5 pm) [phone] (afterhours)

## 2014-05-21 NOTE — Telephone Encounter (Signed)
[facility] 

## 2014-05-29 ENCOUNTER — Encounter: Payer: Self-pay | Admitting: *Deleted

## 2014-06-14 ENCOUNTER — Non-Acute Institutional Stay (SKILLED_NURSING_FACILITY): Payer: Medicare Other | Admitting: Adult Health

## 2014-06-14 ENCOUNTER — Encounter: Payer: Self-pay | Admitting: Adult Health

## 2014-06-14 DIAGNOSIS — K219 Gastro-esophageal reflux disease without esophagitis: Secondary | ICD-10-CM

## 2014-06-14 DIAGNOSIS — R4189 Other symptoms and signs involving cognitive functions and awareness: Secondary | ICD-10-CM

## 2014-06-14 DIAGNOSIS — E876 Hypokalemia: Secondary | ICD-10-CM

## 2014-06-14 DIAGNOSIS — R5381 Other malaise: Secondary | ICD-10-CM

## 2014-06-14 DIAGNOSIS — K5900 Constipation, unspecified: Secondary | ICD-10-CM

## 2014-06-14 DIAGNOSIS — R251 Tremor, unspecified: Secondary | ICD-10-CM

## 2014-06-14 DIAGNOSIS — J45909 Unspecified asthma, uncomplicated: Secondary | ICD-10-CM

## 2014-06-14 DIAGNOSIS — F419 Anxiety disorder, unspecified: Secondary | ICD-10-CM

## 2014-06-14 NOTE — Progress Notes (Signed)
Patient ID: Wendy Vasquez, female   DOB: [birth year], 80 y.o.   MRN: [id number]   06/14/2014  Facility:  Nursing Home Location:  [facility] Room Number: [id number] LEVEL OF CARE:  SNF (31)  Chief Complaint  Patient presents with  . Discharge Note    Physical deconditioning, tremors, constipation, hypokalemia, asthma, anxiety, GERD and cognitive impairment    HISTORY OF PRESENT ILLNESS:  This is an 80 year old female who was for discharge home with home health PT, OT, speech therapy and nursing. She has been admitted to [facility] on 05/18/14. She was discharged to [facility] and after one day was discharged home. She was admitted to [facility] for deconditioning and weakness after being treated for pericardial effusion secondary to viral pneumonitis @ [facility]. She was admitted to this facility for short-term rehabilitation after the patient's recent hospitalization.  Patient has completed SNF rehabilitation and therapy has cleared the patient for discharge.  REASSESSMENT OF ONGOING PROBLEMS:  ASTHMA: The patient's asthma remains stable. Patient denies shortness of breath, dyspnea on exertion or wheezing. No complications reported from the medications currently being used.  GERD: pt's GERD is stable.  Denies ongoing heartburn, abd. Pain, nausea or vomiting.  Currently on a PPI & tolerates it without any adverse reactions.  CONSTIPATION: The constipation remains stable. No complications from the medications presently being used. Patient denies ongoing constipation, abdominal pain, nausea or vomiting.   PAST MEDICAL HISTORY:  Past Medical History  Diagnosis Date  . Hypertension   . HLD (hyperlipidemia)   . Bleeding ulcer     requiring transfusions  . H/O hiatal hernia   . History of blood transfusion     "related to bleeding ulcers"  . Asthma     "a touch"  . Pneumonia X 1  . Diabetes mellitus without complication     "diet controlled"   . GERD (gastroesophageal reflux disease)   . Migraine     hx  . Daily headache     "last couple days" (04/24/2014)  . Arthritis     "all over"  . Bleeding ulcer     CURRENT MEDICATIONS: Reviewed per MAR/see medication list  Allergies  Allergen Reactions  . Ciprofloxacin Hives  . Sulphur [Sulfur] Anaphylaxis     REVIEW OF SYSTEMS:  Unobtainable due to patient being not a good historian  PHYSICAL EXAMINATION  GENERAL: no acute distress, normal body habitus EYES: conjunctivae normal, sclerae normal, normal eye lids NECK: supple, trachea midline, no neck masses, no thyroid tenderness, no thyromegaly LYMPHATICS: no LAN in the neck, no supraclavicular LAN RESPIRATORY: breathing is even & unlabored, BS CTAB CARDIAC: RRR, no murmur,no extra heart sounds, no edema GI: abdomen soft, normal BS, no masses, no tenderness, no hepatomegaly, no splenomegaly EXTREMITIES: Has rigidity on all 4 extremities NEURO:  +tremors PSYCHIATRIC: the patient was not able to answer queries but has good eye contact, affect & behavior appropriate  LABS/RADIOLOGY: Labs reviewed: Basic Metabolic Panel:  Recent Labs  05/15/2509/06/15 2206 04/25/14 0700  04/27/14 0300 04/28/14 0230 04/29/14 0337  NA 137 137  < > 140 139 136*  K 4.1 4.4  < > 3.3* 4.3 5.0  CL 103 106  < > 101 98 99  CO2 22 19  < > 21 26 24   GLUCOSE 147* 154*  < > 92 109* 109*  BUN 12 11  < > 10 16 19   CREATININE 0.83 0.72  < > 0.94 0.94 0.91  CALCIUM 9.2 9.2  < > 9.5 10.7* 10.1  MG 1.8 2.0  --   --   --   --   < > = values in this interval not displayed. Liver Function Tests:  Recent Labs  04/23/14 2047 04/24/14 2206 04/26/14 0340  AST 20 15 18   ALT 6 6 6   ALKPHOS 113 81 65  BILITOT 0.4 0.3 0.2*  PROT 7.5 6.4 5.6*  ALBUMIN 3.1* 2.5* 2.0*   CBC:  Recent Labs  04/12/14 0600 04/23/14 2047  04/25/14 1530 04/28/14 0230 04/28/14 1932  WBC 10.5 13.5*  < > 10.4 6.8 5.4  NEUTROABS 7.7 11.6*  --  7.8*  --   --   HGB 10.1*  12.0  13.9  < > 9.0* 9.8* 9.4*  HCT 30.4* 36.5  41.0  < > 27.5* 29.3* 28.6*  MCV 93.3 94.1  < > 92.9 91.0 92.0  PLT 310 383  < > 283 415* 386  < > = values in this interval not displayed.  Cardiac Enzymes:  Recent Labs  04/11/14 1836 04/12/14 0136 04/24/14 2206 04/25/14 1530  CKTOTAL  --   --   --  30  TROPONINI <0.30 <0.30 <0.30  --     CBG:  Recent Labs  04/30/14 2053 05/01/14 0610 05/01/14 1117  GLUCAP 105* 116* 123*     ASSESSMENT/PLAN:  Physical deconditioning - for home health PT, OT, ST and nursing Cognitive impairment - for home health speech therapy Tremors - Sinemet was discontinued due to side effects per daughter; daughter does not want to try new medication Constipation - stable; continue MiraLAX 17 g by mouth daily and Colace 200 mg by mouth daily at bedtime Hypokalemia - will repleted; K 4.3; discontinue Kdur and BMP in 1 week Asthma - stable; continue Symbicort A ER 60-4 0.5 g 2 puffs twice a day and Advair 250-50 mcg 1 puff twice a day Anxiety - stable; continue Xanax 0.25 mg 1 tab by mouth daily at bedtime GERD - stable; continue Prilosec   I have filled out patient's discharge paperwork and written prescriptions.  Patient will receive home health PT, OT, ST and Nursing.     Total discharge time: Less than 30 minutes  Discharge time involved coordination of the discharge process with Child psychotherapistsocial worker, nursing staff and therapy department. Medical justification for home health services verified.    CPT CODE: [id number]    [facility] Center[facility]INA-VARGAS,Wendy Monsanto, NP [facility] [phone]

## 2015-07-05 IMAGING — CT CT HEAD W/O CM
2 of 4 series · 9 of 30 positions shown, 10 images · non-contrast
Comparison: None.

CLINICAL DATA: Generalized weakness, acute onset. Initial
encounter.

EXAM:
CT HEAD WITHOUT CONTRAST
TECHNIQUE: Contiguous axial images were obtained from the base of the skull
through the vertex without intravenous contrast.

[Series 205: recon axial brain · axial · 0.50mm/px · z∈[+25,+98]mm · 4 of 29 slices shown, 5 images]
[im 6/29  brain]
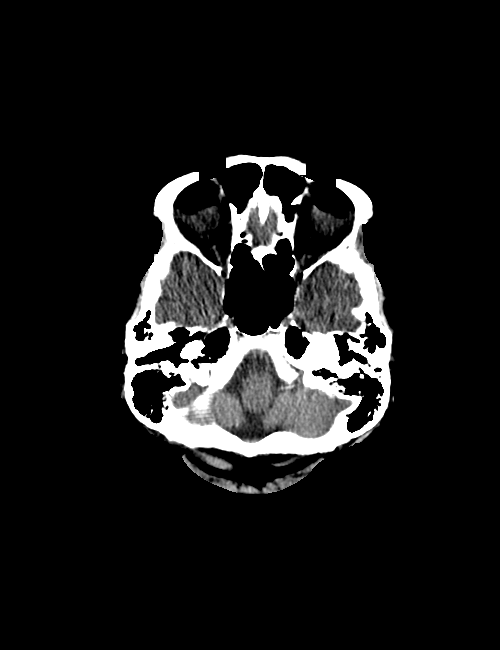
[im 6/29  bone]
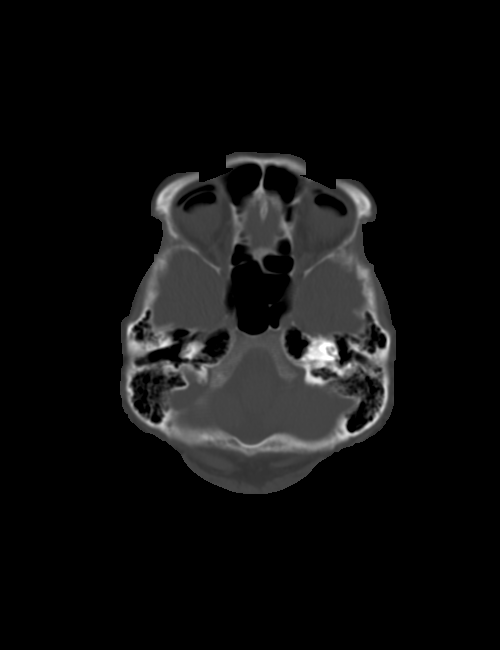
[im 12/29  brain]
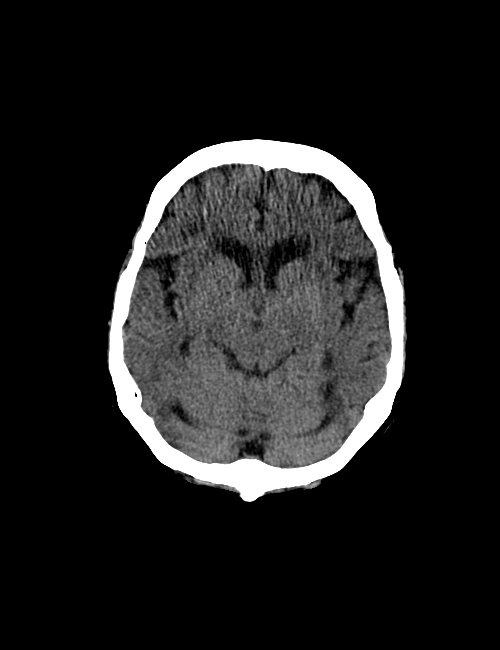
[im 17/29  brain]
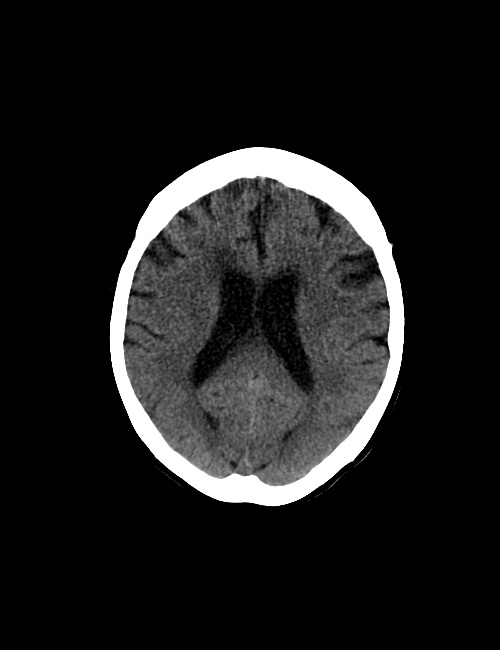
[im 23/29  brain]
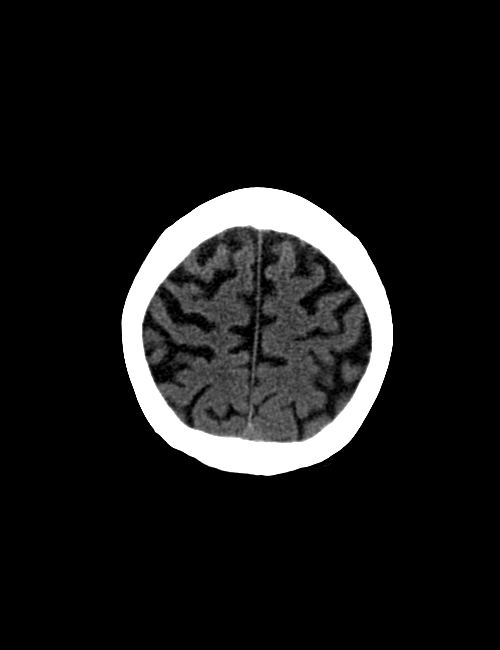

[Series 206: recon axial bone · axial · 0.51mm/px · z∈[+44,+136]mm · 5 of 33 slices shown]
[im 6/33  bone]
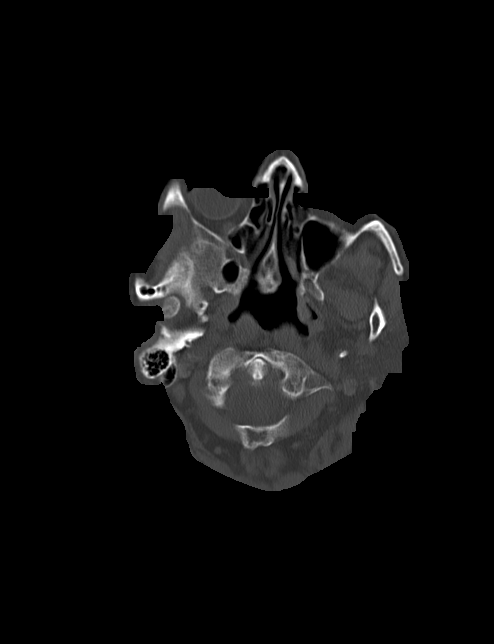
[im 11/33  bone]
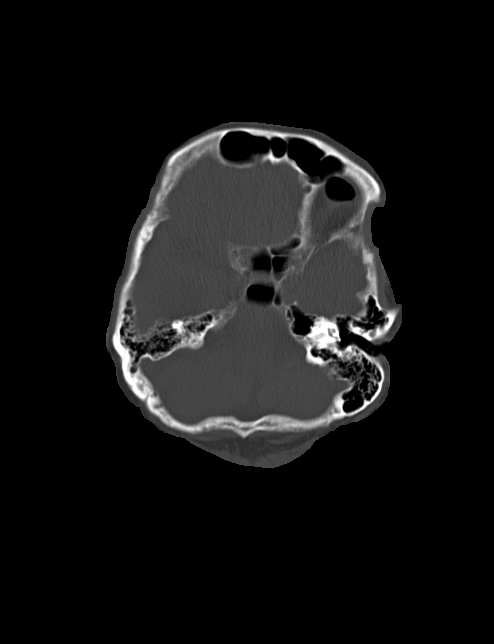
[im 17/33  bone]
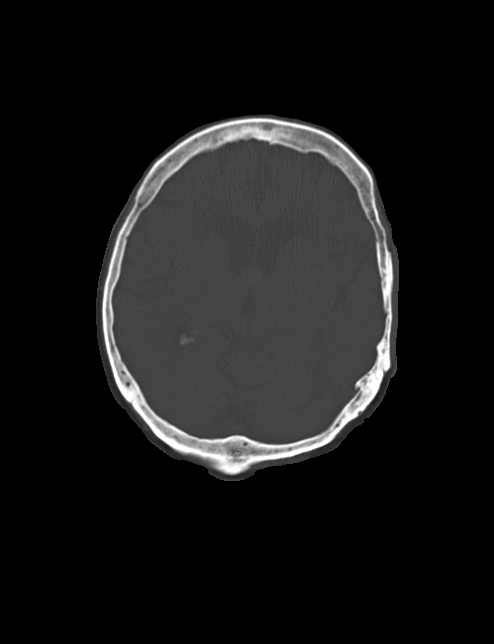
[im 22/33  bone]
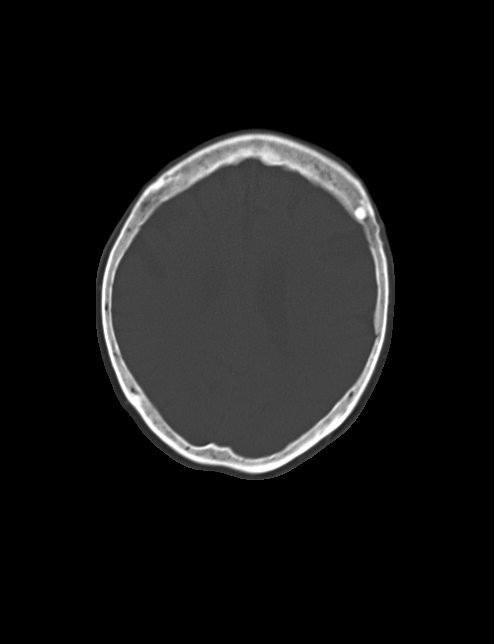
[im 27/33  bone]
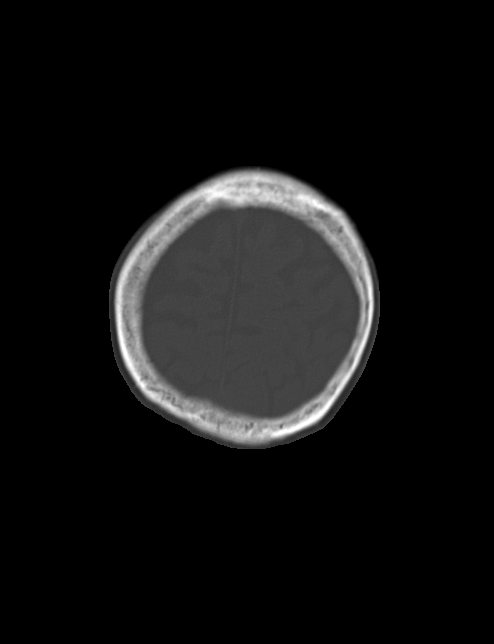

[9 of 30 positions shown; findings below may reference images not displayed]

FINDINGS: There is no evidence of acute infarction, or intra- or extra-axial
hemorrhage on CT.

There is a 1.1 cm hyperdense colloid cyst noted at the roof of the
third ventricle. Prominence of the ventricles and sulci reflects
mild to moderate cortical volume loss. Mild cerebellar atrophy is
noted. Scattered periventricular white matter change likely reflects
small vessel ischemic microangiopathy.

The brainstem and fourth ventricle are within normal limits. The
basal ganglia are unremarkable in appearance. The cerebral
hemispheres demonstrate grossly normal gray-white differentiation.
No mass effect or midline shift is seen.

There is no evidence of fracture; visualized osseous structures are
unremarkable in appearance. The visualized portions of the orbits
are within normal limits. The paranasal sinuses and mastoid air
cells are well-aerated. No significant soft tissue abnormalities are
seen.
IMPRESSION: 1. No acute intracranial pathology seen on CT.
[DATE] cm hyperdense colloid cyst at the roof of the third
ventricle. Sudden death due to a colloid cyst at this age is rare,
but would usually present with headaches and/or vomiting.
3. Mild to moderate cortical volume loss and scattered small vessel
ischemic microangiopathy.

## 2015-07-06 IMAGING — CR DG CHEST 2V
2 series · 2 of 2 positions shown · non-contrast
Comparison: Chest radiograph 04/23/2014

CLINICAL DATA: Patient with hospital acquired pneumonia

EXAM:
CHEST  2 VIEW

[w chest lat]
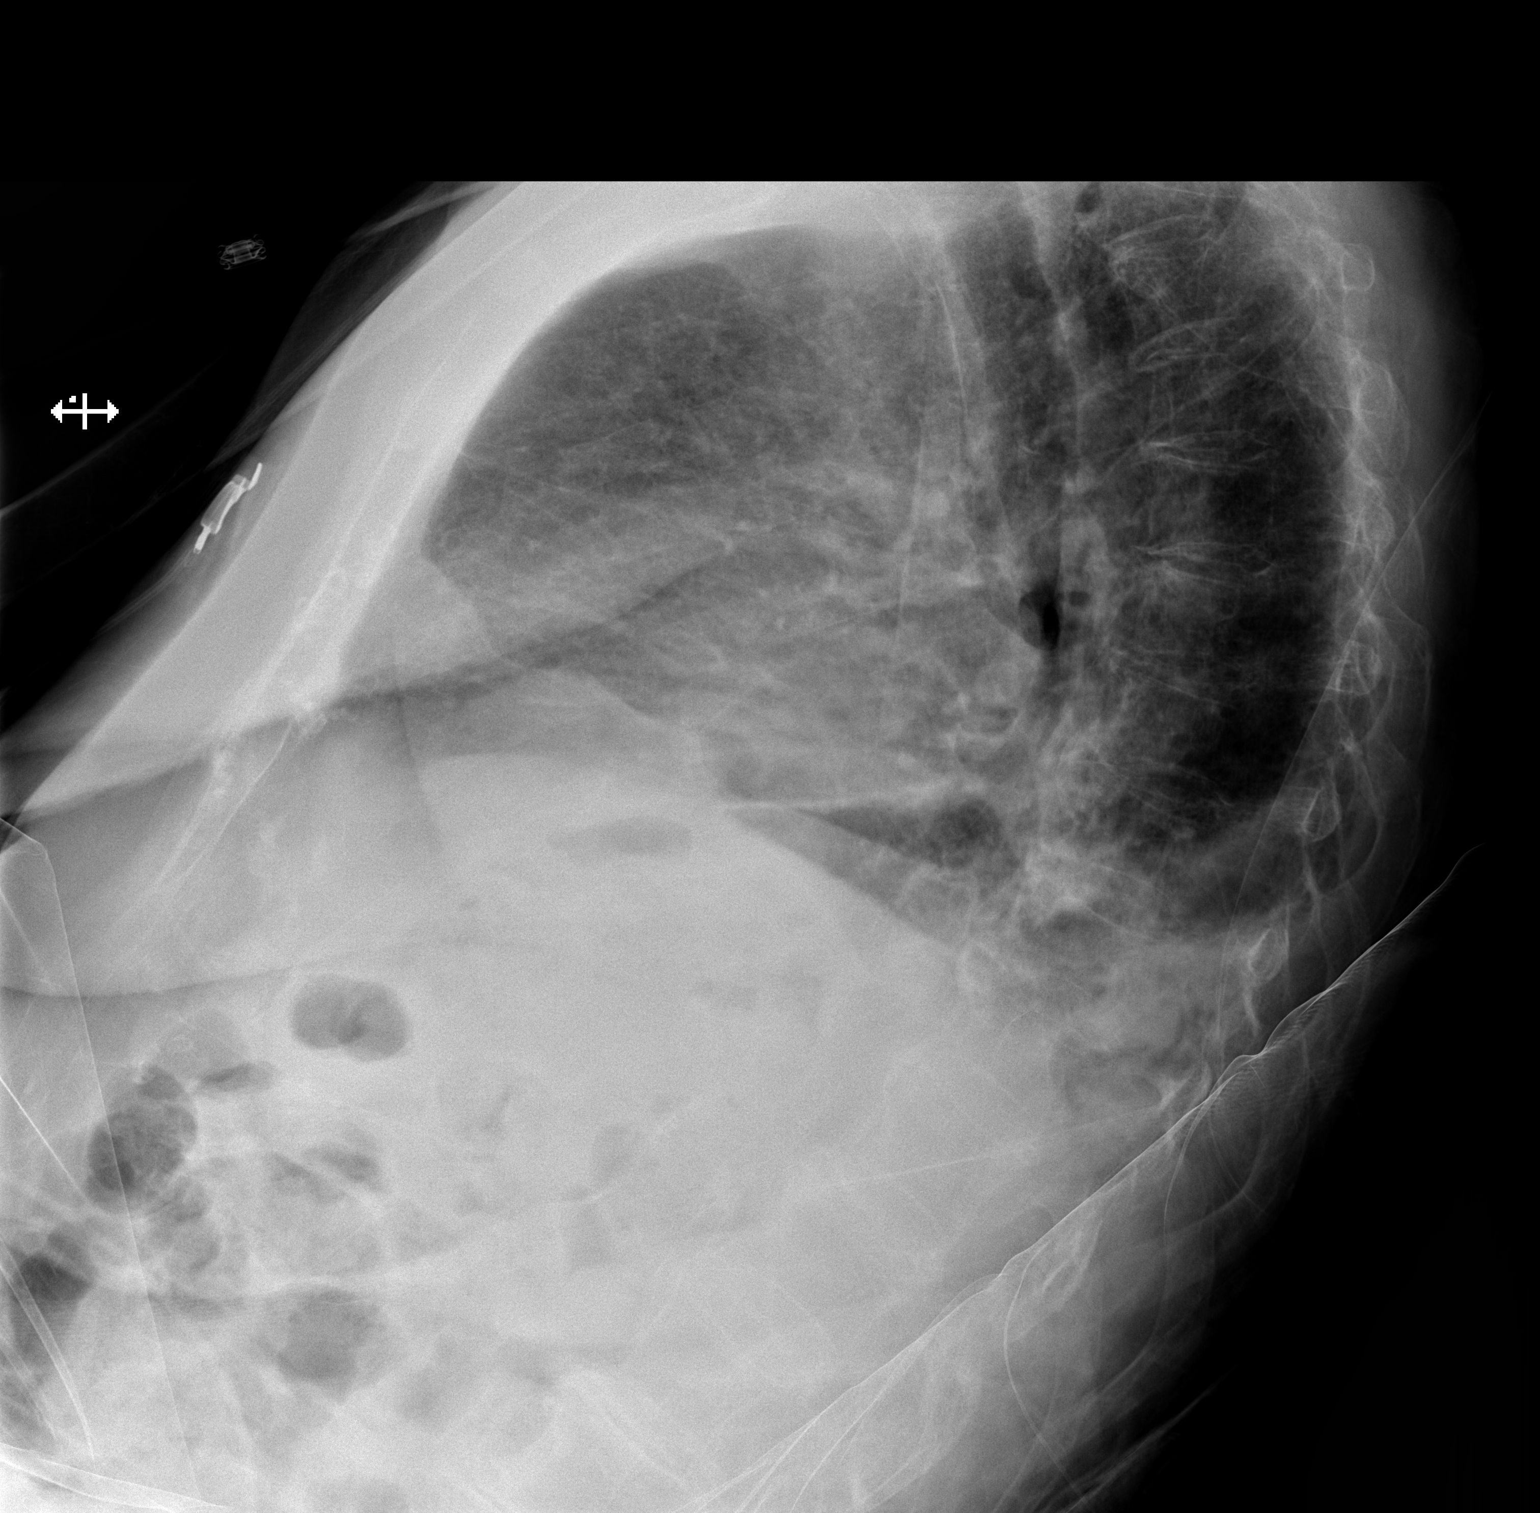

[x chest ap]
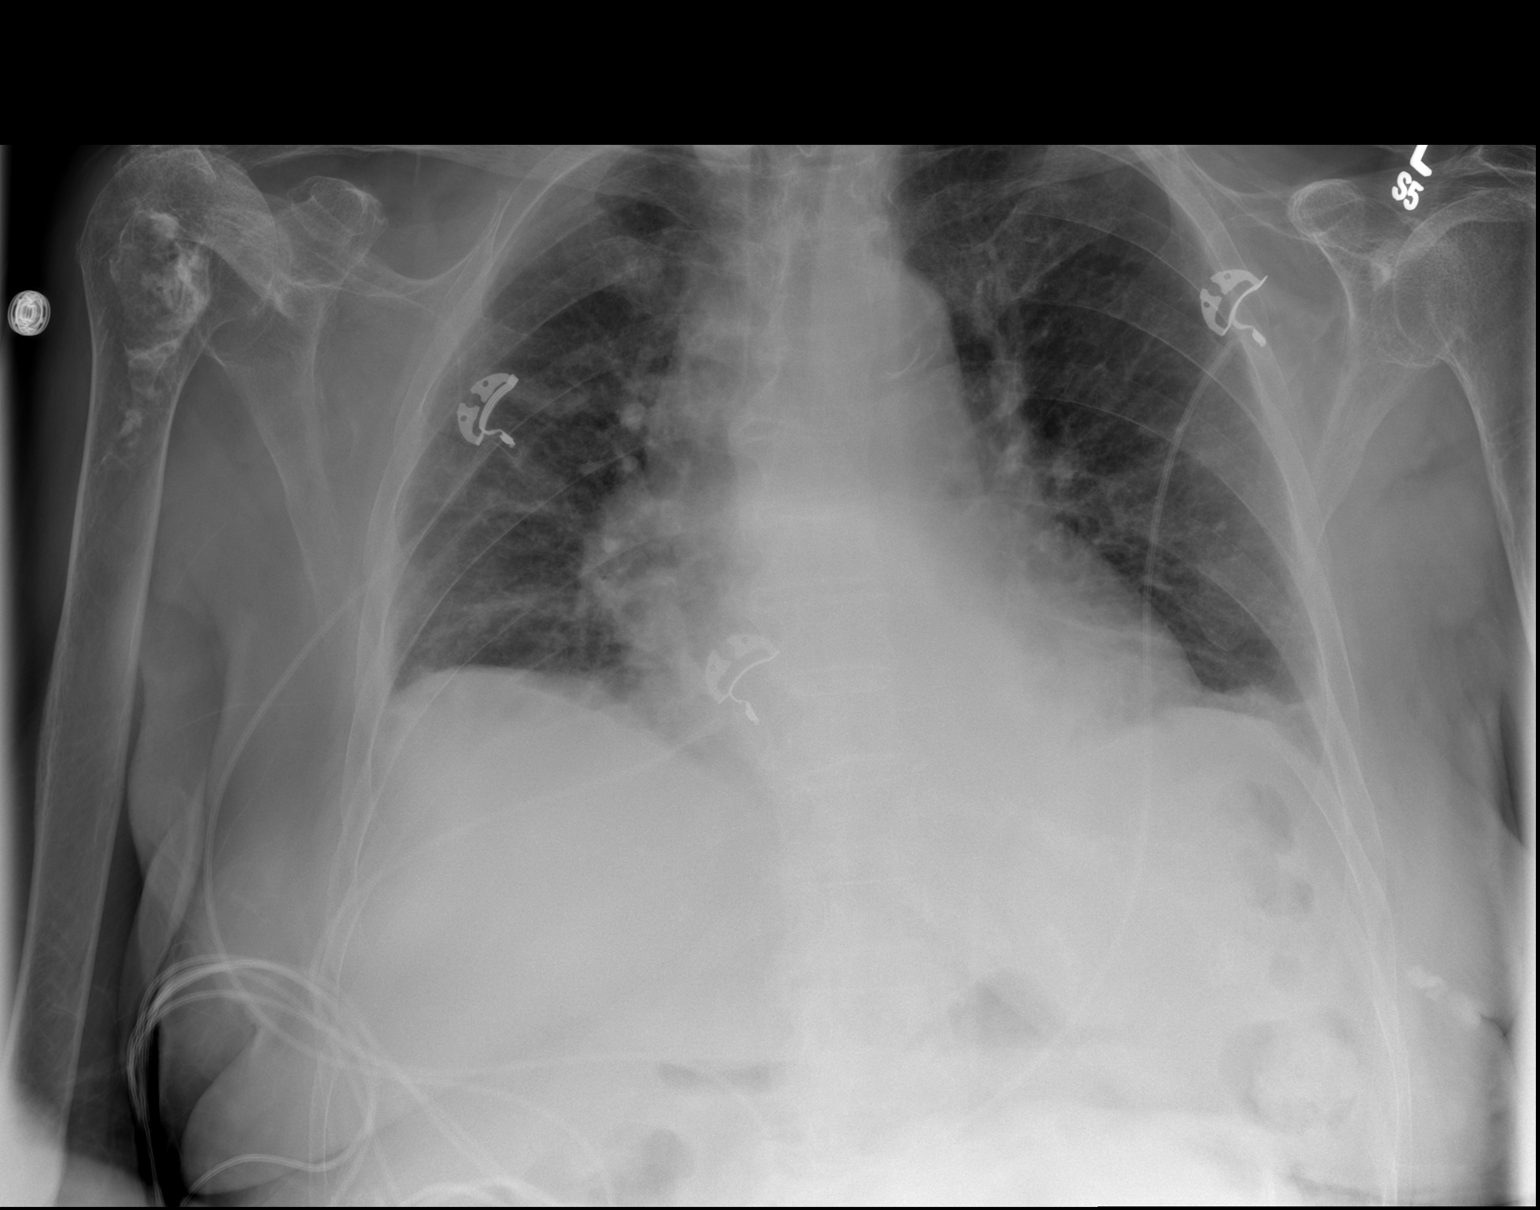

[2 of 2 positions shown; findings below may reference images not displayed]

FINDINGS: Low lung volumes. Stable cardiac and mediastinal contours.
Heterogeneous opacities right mid and lower lung. Minimal
heterogeneous opacities left lung base. Small bilateral pleural
effusions. High-riding right humeral head. Sclerotic lesion proximal
right humerus may represent enchondroma are bone infarct. Thoracic
spine degenerative change.
IMPRESSION: Low lung volumes.

Right mid and lower lung as well as left lower lung heterogeneous
opacities which may represent scattered atelectasis or infection.

## 2015-07-09 IMAGING — CR DG CHEST 1V PORT
1 series · 1 of 1 positions shown · non-contrast
Comparison: 04/26/2014 and 04/25/2014

CLINICAL DATA: Pneumonia.

EXAM:
PORTABLE CHEST - 1 VIEW

[AP]
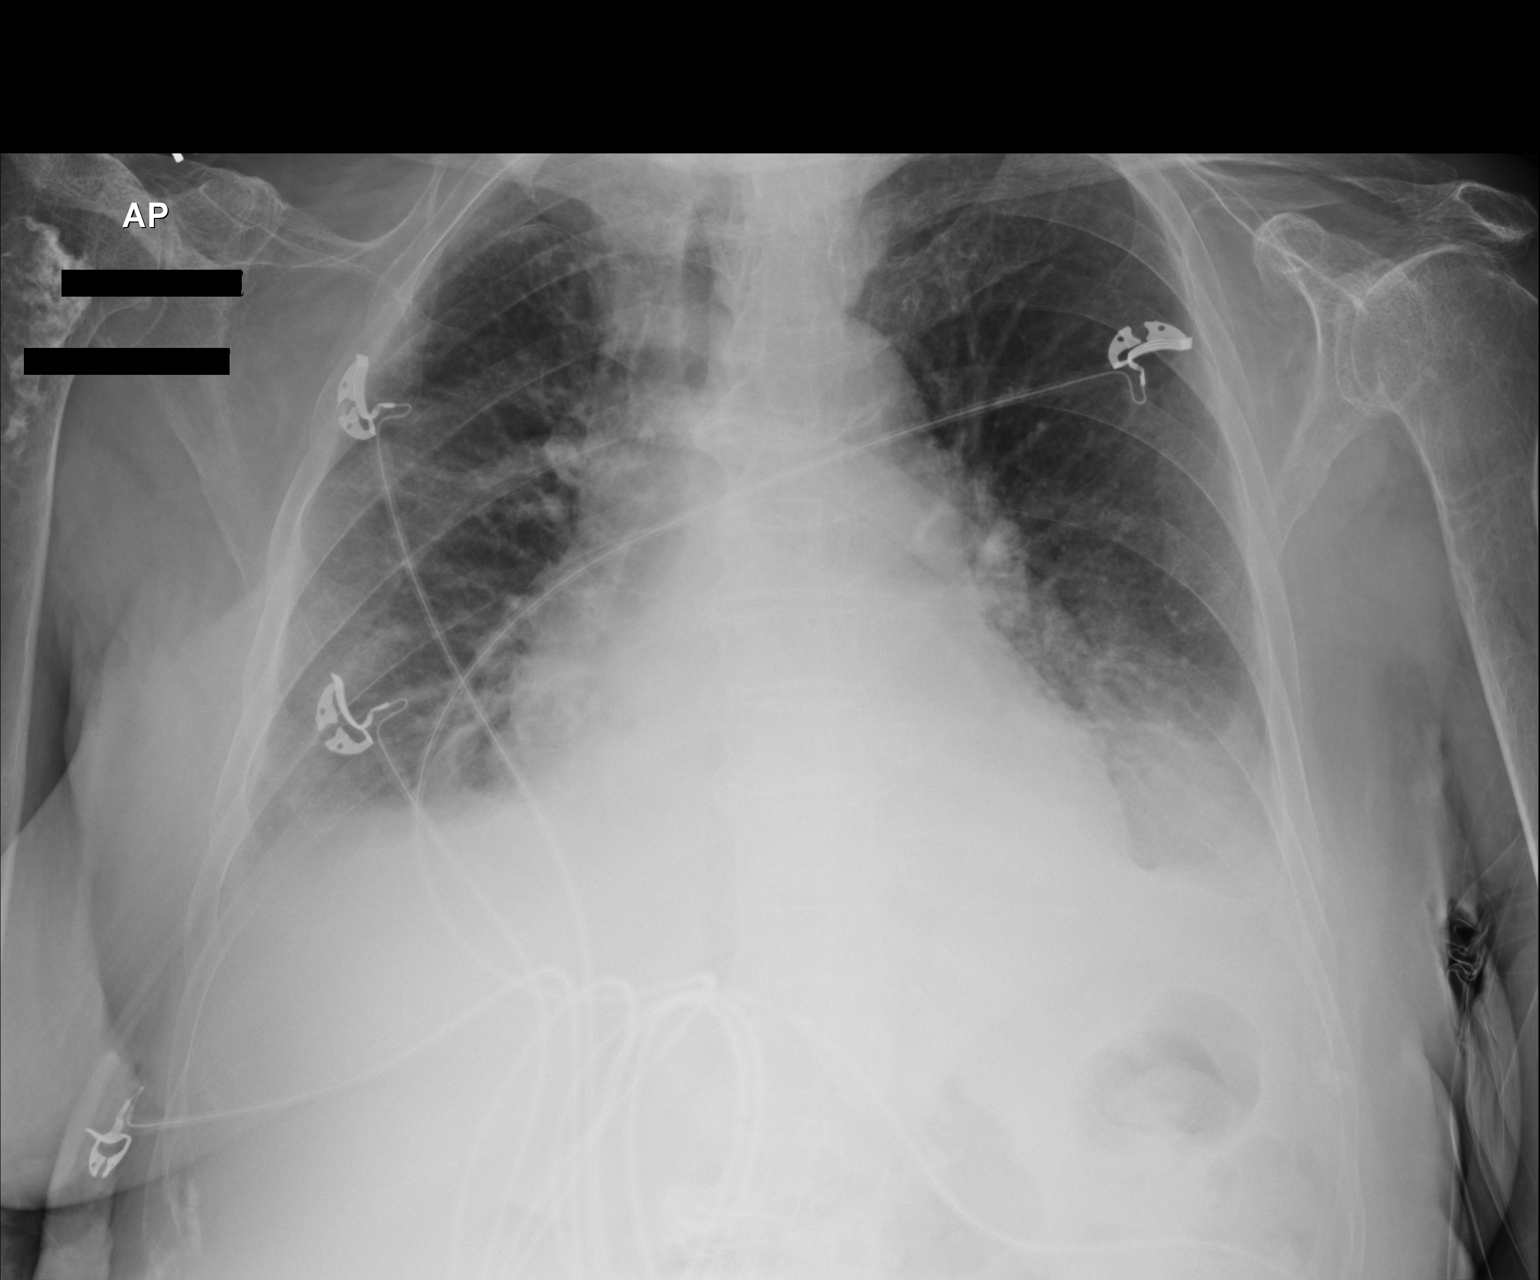

[1 of 1 positions shown; findings below may reference images not displayed]

FINDINGS: Bilateral pleural effusions have diminished. Pulmonary vascularity
is normal. Cardiac silhouette is slightly prominent but the patient
has a pericardial effusion as demonstrated on CT scan of 04/25/2014.
No discrete infiltrates or pulmonary edema.
IMPRESSION: Decreasing bilateral pleural effusions.

## 2016-07-02 ENCOUNTER — Emergency Department (HOSPITAL_COMMUNITY): Payer: Medicare Other

## 2016-07-02 ENCOUNTER — Emergency Department (HOSPITAL_COMMUNITY)
Admission: EM | Admit: 2016-07-02 | Discharge: 2016-07-02 | Disposition: A | Discharge status: Home / Self Care | Payer: Medicare Other | Attending: Physician Assistant | Admitting: Physician Assistant

## 2016-07-02 ENCOUNTER — Encounter (HOSPITAL_COMMUNITY): Payer: Self-pay

## 2016-07-02 DIAGNOSIS — J45909 Unspecified asthma, uncomplicated: Secondary | ICD-10-CM | POA: Insufficient documentation

## 2016-07-02 DIAGNOSIS — I10 Essential (primary) hypertension: Secondary | ICD-10-CM | POA: Diagnosis not present

## 2016-07-02 DIAGNOSIS — R059 Cough, unspecified: Secondary | ICD-10-CM

## 2016-07-02 DIAGNOSIS — Z7982 Long term (current) use of aspirin: Secondary | ICD-10-CM | POA: Diagnosis not present

## 2016-07-02 DIAGNOSIS — E119 Type 2 diabetes mellitus without complications: Secondary | ICD-10-CM | POA: Diagnosis not present

## 2016-07-02 DIAGNOSIS — Z79899 Other long term (current) drug therapy: Secondary | ICD-10-CM | POA: Insufficient documentation

## 2016-07-02 DIAGNOSIS — Z87891 Personal history of nicotine dependence: Secondary | ICD-10-CM | POA: Diagnosis not present

## 2016-07-02 DIAGNOSIS — R05 Cough: Secondary | ICD-10-CM

## 2016-07-02 DIAGNOSIS — Z96653 Presence of artificial knee joint, bilateral: Secondary | ICD-10-CM | POA: Diagnosis not present

## 2016-07-02 LAB — URINALYSIS, ROUTINE W REFLEX MICROSCOPIC
Bacteria, UA: NONE SEEN
Bilirubin Urine: NEGATIVE
Glucose, UA: NEGATIVE mg/dL
KETONES UR: NEGATIVE mg/dL
LEUKOCYTES UA: NEGATIVE
Nitrite: NEGATIVE
PH: 6 (ref 5.0–8.0)
Protein, ur: NEGATIVE mg/dL
Specific Gravity, Urine: 1.011 (ref 1.005–1.030)
Squamous Epithelial / LPF: NONE SEEN

## 2016-07-02 LAB — CBC WITH DIFFERENTIAL/PLATELET
BASOS ABS: 0 10*3/uL (ref 0.0–0.1)
Basophils Relative: 0 %
EOS ABS: 0 10*3/uL (ref 0.0–0.7)
Eosinophils Relative: 0 %
HCT: 38.9 % (ref 36.0–46.0)
HEMOGLOBIN: 13.3 g/dL (ref 12.0–15.0)
Lymphocytes Relative: 13 %
Lymphs Abs: 1.4 10*3/uL (ref 0.7–4.0)
MCH: 32.1 pg (ref 26.0–34.0)
MCHC: 34.2 g/dL (ref 30.0–36.0)
MCV: 94 fL (ref 78.0–100.0)
Monocytes Absolute: 1 10*3/uL (ref 0.1–1.0)
Monocytes Relative: 10 %
NEUTROS ABS: 8.1 10*3/uL — AB (ref 1.7–7.7)
NEUTROS PCT: 77 %
Platelets: 235 10*3/uL (ref 150–400)
RBC: 4.14 MIL/uL (ref 3.87–5.11)
RDW: 13.4 % (ref 11.5–15.5)
WBC: 10.6 10*3/uL — AB (ref 4.0–10.5)

## 2016-07-02 LAB — I-STAT CG4 LACTIC ACID, ED: LACTIC ACID, VENOUS: 1.41 mmol/L (ref 0.5–1.9)

## 2016-07-02 LAB — BASIC METABOLIC PANEL
ANION GAP: 8 (ref 5–15)
BUN: 15 mg/dL (ref 6–20)
CALCIUM: 10.1 mg/dL (ref 8.9–10.3)
CO2: 25 mmol/L (ref 22–32)
Chloride: 107 mmol/L (ref 101–111)
Creatinine, Ser: 0.59 mg/dL (ref 0.44–1.00)
GFR calc non Af Amer: >60 mL/min (ref >60)
Glucose, Bld: 131 mg/dL — ABNORMAL HIGH (ref 65–99)
Potassium: 4 mmol/L (ref 3.5–5.1)
SODIUM: 140 mmol/L (ref 135–145)

## 2016-07-02 MED ORDER — DEXTROMETHORPHAN-GUAIFENESIN 10-100 MG/5ML PO LIQD: 5.0000 mL | ORAL | 0 refills | Status: DC | PRN

## 2016-07-02 NOTE — ED Notes (Signed)
Daughter/HCPOA verbalized understanding of discharge instructions.

## 2016-07-02 NOTE — ED Triage Notes (Signed)
Pt from home.  Lives w/her daughter.  EMS reports pt has had a cough x last 2 days, but has actually been on abx recently and daughter thinks she has pneumonia again.  Pt is at baseline: non-verbal, no-ambulatory, but does makes eye contact.  Also daughter reports a "hint of UTI smell".  VS by EMS: 160/p, 16, 97%, CBG 205 (after pancakes), 98.0 temp.

## 2016-07-02 NOTE — Discharge Instructions (Signed)
Her chest xray was normal today. No signs of pneumonia. You can take the cough syrup as needed and as discussed with Dr. Juliann ParesMackeun.  You may use tylenol and ibuprofen for fever. Please follow-up with her primary care doctor in response to today's visit. Return to the ED if she develops any worsening symptoms.

## 2016-07-02 NOTE — ED Notes (Signed)
[facility] here to receive patient 

## 2016-07-02 NOTE — ED Provider Notes (Signed)
[facility]-EMERGENCY DEPT Provider Note   CSN: [id number] Arrival date & time: 07/02/16  1218     History   Chief Complaint Chief Complaint  Patient presents with  . Cough    HPI Wendy RamsRuth K Benedetti is a 82 y.o. female.  82 year old Caucasian female with past medical history significant for diabetes, hypertension, dementia that presents to the ED today by EMS with daughter from home for cough. Daughter states that patient has had chest congestion 2 days. She reports a wet cough but is not able to expel any sputum. She spoke with her primary care doctor approximately 3 days ago who gave them a prescription for Flonase and an unknown antibiotic. However patient's daughter states that she has been given her leftover Keflex for 10 days and just finished approximate 4 days ago. Daughter also reports a "hint of UTI spell". Patient is at baseline per daughter. She is nonverbal and nonambulatory. Patient is able to make eye contact. Daughter states that she has been running a low-grade fever has been giving her Tylenol. States the max temp was 100.2. Patient is difficulty to obtain review of systems  And physical from due to her dementia and being nonverbal. Daughter reports no other complaints at this time. She just wanted her to get checked out and make sure she did not have pneumonia. Patient had pneumonia last year that required hospitalization and became septic.      Past Medical History:  Diagnosis Date  . Arthritis    "all over"  . Asthma    "a touch"  . Bleeding ulcer    requiring transfusions  . Bleeding ulcer   . Daily headache    "last couple days" (04/24/2014)  . Diabetes mellitus without complication (HCC)    "diet controlled"  . GERD (gastroesophageal reflux disease)   . H/O hiatal hernia   . History of blood transfusion    "related to bleeding ulcers"  . HLD (hyperlipidemia)   . Hypertension   . Migraine    hx  . Pneumonia X 1    Patient Active Problem List   Diagnosis  Date Noted  . Physical deconditioning 05/21/2014  . Tremors of nervous system 05/21/2014  . Cognitive impairment 05/21/2014  . Dysphagia 05/21/2014  . Anxiety state 05/21/2014  . Esophageal reflux 05/21/2014  . CN (constipation) 05/21/2014  . Viral pneumonitis 04/27/2014  . A-fib (HCC) 04/27/2014  . Pericardial effusion without cardiac tamponade 04/27/2014  . Abnormality of gait 04/27/2014  . SIRS (systemic inflammatory response syndrome) (HCC) 04/23/2014  . HCAP (healthcare-associated pneumonia) 04/23/2014  . Sepsis (HCC) 04/23/2014  . Asthma 04/11/2014  . CAP (community acquired pneumonia) 04/11/2014  . AKI (acute kidney injury) (HCC) 11/12/2013  . HTN (hypertension) 11/10/2013  . HLD (hyperlipidemia) 11/10/2013  . Anemia 11/10/2013  . Syncope 11/09/2013    Past Surgical History:  Procedure Laterality Date  . CATARACT EXTRACTION  "years ago"   "don't know which side; don't know if they put lens in"  . DILATION AND CURETTAGE OF UTERUS     "after she lost a child, I'd assume"  . JOINT REPLACEMENT    . TOTAL KNEE ARTHROPLASTY Bilateral     OB History    No data available       Home Medications    Prior to Admission medications   Medication Sig Start Date End Date Taking? Authorizing Provider  acetaminophen (TYLENOL) 500 MG tablet Take 1,000 mg by mouth 4 (four) times daily. Take everyday per daughter  Historical Provider, MD  ADVAIR DISKUS 250-50 MCG/DOSE AEPB Inhale 1 puff into the lungs 2 (two) times daily.  09/27/13   Historical Provider, MD  ALPRAZolam Prudy Feeler) 0.25 MG tablet Take one tablet by mouth every night at bedtime 05/21/14   Tiffany L Reed, DO  aspirin 81 MG tablet Take 81 mg by mouth daily.    Historical Provider, MD  calcium-vitamin D (OSCAL WITH D) 500-200 MG-UNIT per tablet Take 1 tablet by mouth 2 (two) times daily.    Historical Provider, MD  Cranberry 1000 MG CAPS Take 2,000 mg by mouth daily with breakfast.     Historical Provider, MD  docusate  sodium (COLACE) 100 MG capsule Take 200 mg by mouth at bedtime.    Historical Provider, MD  esomeprazole (NEXIUM) 40 MG capsule Take 40 mg by mouth at bedtime.     Historical Provider, MD  guaiFENesin (MUCINEX) 600 MG 12 hr tablet Take 600 mg by mouth 2 (two) times daily as needed for cough or to loosen phlegm.    Historical Provider, MD  Multiple Vitamin (MULTIVITAMIN WITH MINERALS) TABS tablet Take 1 tablet by mouth daily.    Historical Provider, MD  polyethylene glycol (MIRALAX / GLYCOLAX) packet Take 17 g by mouth daily as needed for mild constipation.    Historical Provider, MD  potassium chloride SA (K-DUR,KLOR-CON) 20 MEQ tablet Take 1 tablet (20 mEq total) by mouth once. 04/30/14   Hyacinth Meeker, MD  Probiotic Product (PROBIOTIC PO) Take 1 capsule by mouth daily.    Historical Provider, MD  tetrahydrozoline (VISINE) 0.05 % ophthalmic solution Place 1 drop into both eyes daily as needed (for dry eyes).    Historical Provider, MD  vitamin B-12 (CYANOCOBALAMIN) 1000 MCG tablet Take 1,000 mcg by mouth daily with breakfast.     Historical Provider, MD    Family History Family History  Problem Relation Age of Onset  . Colon cancer Mother     Social History Social History  Substance Use Topics  . Smoking status: Former Smoker    Types: Cigarettes  . Smokeless tobacco: Never Used     Comment: 04/24/2014 "quit smoking in the 1970's or before; didn't smoke much; don't know for how long"  . Alcohol use No     Allergies   Ciprofloxacin and Sulphur [sulfur]   Review of Systems Review of Systems  Unable to perform ROS: Dementia  All other systems reviewed and are negative.    Physical Exam Updated Vital Signs BP 153/92 (BP Location: Left Arm)   Pulse 101   Temp 99.1 F (37.3 C) (Oral)   Resp 15   Ht 5\' 5"  (1.651 m)   Wt 59 kg   SpO2 93%   BMI 21.63 kg/m   Physical Exam  Constitutional: She appears well-developed and well-nourished. No distress.  HENT:  Head:  Normocephalic and atraumatic.  Right Ear: Tympanic membrane, external ear and ear canal normal.  Left Ear: Tympanic membrane, external ear and ear canal normal.  Nose: Nose normal.  Mouth/Throat: Posterior oropharyngeal erythema present. No oropharyngeal exudate or posterior oropharyngeal edema.  Eyes: Conjunctivae are normal. Pupils are equal, round, and reactive to light. Right eye exhibits no discharge. Left eye exhibits no discharge. No scleral icterus.  Neck: Normal range of motion. Neck supple. No thyromegaly present.  Cardiovascular: Normal rate, regular rhythm, normal heart sounds and intact distal pulses.  Exam reveals no gallop and no friction rub.   No murmur heard. Initially tachycardic in triage however she  was heart rate of 83 and regular on my exam.  Pulmonary/Chest: Effort normal and breath sounds normal. No respiratory distress. She has no wheezes.  Patient sounds congested in the chest. Unable to clear cough. Unable to get great lung exam due to patient making moaning noises.  Abdominal: Soft. Bowel sounds are normal. She exhibits no distension. There is no rebound and no guarding.  The patient does not appear tender on abdominal exam or in pain.  Musculoskeletal: Normal range of motion.  Lymphadenopathy:    She has no cervical adenopathy.  Neurological: She is alert.  Patient is nonverbal. Makes poor eye contact and this is baseline for patient.  Skin: Skin is warm and dry. Capillary refill takes less than 2 seconds.  Nursing note and vitals reviewed.    ED Treatments / Results  Labs (all labs ordered are listed, but only abnormal results are displayed) Labs Reviewed  BASIC METABOLIC PANEL - Abnormal; Notable for the following:       Result Value   Glucose, Bld 131 (*)    All other components within normal limits  CBC WITH DIFFERENTIAL/PLATELET - Abnormal; Notable for the following:    WBC 10.6 (*)    Neutro Abs 8.1 (*)    All other components within normal  limits  URINALYSIS, ROUTINE W REFLEX MICROSCOPIC - Abnormal; Notable for the following:    Hgb urine dipstick MODERATE (*)    All other components within normal limits  I-STAT CG4 LACTIC ACID, ED    EKG  EKG Interpretation None       Radiology Dg Chest 2 View  Result Date: 07/02/2016 CLINICAL DATA:  Cough, fever, and chest congestion for 2 days. EXAM: CHEST  2 VIEW COMPARISON:  04/27/2014 FINDINGS: The cardiomediastinal silhouette is within normal limits. Aortic atherosclerosis is noted. The lateral radiograph is mildly degraded, as the patient was unable to raise both arms. There is greatly improved aeration of the lung bases compared to the prior study, with minimal residual opacity remaining in the posterior bases on the lateral radiograph. No sizable pleural effusion or pneumothorax is identified. The patient's chin partially obscures the medial lung apices. Chronic sclerotic lesion in the proximal right humerus is again partially visualized, likely benign. High riding right humeral head nerve may reflect a chronic rotator cuff tear. IMPRESSION: 1. Minimal bibasilar atelectasis, greatly improved from prior. 2. Aortic atherosclerosis. Electronically Signed   By: Sebastian AcheAllen  Grady M.D.   On: 07/02/2016 14:08    Procedures Procedures (including critical care time)  Medications Ordered in ED Medications - No data to display   Initial Impression / Assessment and Plan / ED Course  I have reviewed the triage vital signs and the nursing notes.  Pertinent labs & imaging results that were available during my care of the patient were reviewed by me and considered in my medical decision making (see chart for details).  Clinical Course   Patient presents to the ED with daughter for increased chest congestion, cough, low-grade fever, foul-smelling urine. Unable to get clear lung exam due to patient's moaning during exam. A wet congested cough is noted in the chest. We'll obtain basic labs and  chest x-ray along with urinalysis. Patient is afebrile this time. She is not tachycardic. Lactic acid normal No leukocytosis. All other labs were unremarkable. US with hgb likely from cath but no signs of infection no bacteria, nitrites, or leukocytes. CXR without signs of infiltrate. Likely viral URI with cough. Given symptomatic treatment. She is not  hypoxic. She will be discharged home with daughter with symptomatic treatment. Encouraged to follow up with pcp for today visit. Pt is hemodynamically stable, in NAD. Pain has been managed & has no complaints prior to dc. Pt daughter is comfortable with above plan and is stable for discharge at this time. All questions were answered prior to disposition. Strict return precautions for f/u to the ED were discussed. Patient was seen and examined by Dr. Corlis Leak who is agreeable to the above plan.   Final Clinical Impressions(s) / ED Diagnoses   Final diagnoses:  Cough    New Prescriptions Discharge Medication List as of 07/02/2016  4:56 PM    START taking these medications   Details  dextromethorphan-guaiFENesin (TUSSIN DM) 10-100 MG/5ML liquid Take 5 mLs by mouth every 4 (four) hours as needed for cough., Starting Thu 07/02/2016, Print         Rise Mu, PA-C 07/02/16 2232    Courteney Randall An, MD 07/05/16 2001

## 2016-07-02 NOTE — ED Notes (Signed)
Bed: [id number] Expected date:  Expected time:  Means of arrival:  Comments: EMS- 82yo F, cough/nonambulatory/nonverbal

## 2016-07-02 NOTE — ED Notes (Signed)
Called [facility] for transport.  

## 2016-08-01 ENCOUNTER — Encounter (HOSPITAL_COMMUNITY): Payer: Self-pay | Admitting: Emergency Medicine

## 2016-08-01 ENCOUNTER — Inpatient Hospital Stay (HOSPITAL_COMMUNITY)
Admission: EM | Admit: 2016-08-01 | Discharge: 2016-08-07 | DRG: 871 | Disposition: A | Discharge status: Hospice | Payer: Medicare Other | Attending: Internal Medicine | Admitting: Internal Medicine

## 2016-08-01 ENCOUNTER — Emergency Department (HOSPITAL_COMMUNITY): Payer: Medicare Other

## 2016-08-01 DIAGNOSIS — R4189 Other symptoms and signs involving cognitive functions and awareness: Secondary | ICD-10-CM | POA: Diagnosis present

## 2016-08-01 DIAGNOSIS — L89219 Pressure ulcer of right hip, unspecified stage: Secondary | ICD-10-CM | POA: Diagnosis present

## 2016-08-01 DIAGNOSIS — L89101 Pressure ulcer of unspecified part of back, stage 1: Secondary | ICD-10-CM | POA: Diagnosis present

## 2016-08-01 DIAGNOSIS — Z66 Do not resuscitate: Secondary | ICD-10-CM | POA: Diagnosis present

## 2016-08-01 DIAGNOSIS — I10 Essential (primary) hypertension: Secondary | ICD-10-CM | POA: Diagnosis present

## 2016-08-01 DIAGNOSIS — Z681 Body mass index (BMI) 19 or less, adult: Secondary | ICD-10-CM | POA: Diagnosis not present

## 2016-08-01 DIAGNOSIS — A419 Sepsis, unspecified organism: Secondary | ICD-10-CM | POA: Diagnosis present

## 2016-08-01 DIAGNOSIS — L8940 Pressure ulcer of contiguous site of back, buttock and hip, unspecified stage: Secondary | ICD-10-CM | POA: Diagnosis present

## 2016-08-01 DIAGNOSIS — E870 Hyperosmolality and hypernatremia: Secondary | ICD-10-CM | POA: Diagnosis present

## 2016-08-01 DIAGNOSIS — I4891 Unspecified atrial fibrillation: Secondary | ICD-10-CM | POA: Diagnosis present

## 2016-08-01 DIAGNOSIS — J4520 Mild intermittent asthma, uncomplicated: Secondary | ICD-10-CM | POA: Diagnosis not present

## 2016-08-01 DIAGNOSIS — F0390 Unspecified dementia without behavioral disturbance: Secondary | ICD-10-CM | POA: Diagnosis present

## 2016-08-01 DIAGNOSIS — R627 Adult failure to thrive: Secondary | ICD-10-CM | POA: Diagnosis present

## 2016-08-01 DIAGNOSIS — R5381 Other malaise: Secondary | ICD-10-CM | POA: Diagnosis present

## 2016-08-01 DIAGNOSIS — E1165 Type 2 diabetes mellitus with hyperglycemia: Secondary | ICD-10-CM | POA: Diagnosis present

## 2016-08-01 DIAGNOSIS — Z7982 Long term (current) use of aspirin: Secondary | ICD-10-CM

## 2016-08-01 DIAGNOSIS — E785 Hyperlipidemia, unspecified: Secondary | ICD-10-CM | POA: Diagnosis present

## 2016-08-01 DIAGNOSIS — J45909 Unspecified asthma, uncomplicated: Secondary | ICD-10-CM | POA: Diagnosis present

## 2016-08-01 DIAGNOSIS — I480 Paroxysmal atrial fibrillation: Secondary | ICD-10-CM | POA: Diagnosis not present

## 2016-08-01 DIAGNOSIS — R1312 Dysphagia, oropharyngeal phase: Secondary | ICD-10-CM | POA: Diagnosis present

## 2016-08-01 DIAGNOSIS — Z7401 Bed confinement status: Secondary | ICD-10-CM | POA: Diagnosis not present

## 2016-08-01 DIAGNOSIS — Z882 Allergy status to sulfonamides status: Secondary | ICD-10-CM

## 2016-08-01 DIAGNOSIS — E878 Other disorders of electrolyte and fluid balance, not elsewhere classified: Secondary | ICD-10-CM | POA: Diagnosis present

## 2016-08-01 DIAGNOSIS — L89150 Pressure ulcer of sacral region, unstageable: Secondary | ICD-10-CM | POA: Diagnosis present

## 2016-08-01 DIAGNOSIS — R52 Pain, unspecified: Secondary | ICD-10-CM | POA: Diagnosis present

## 2016-08-01 DIAGNOSIS — G8929 Other chronic pain: Secondary | ICD-10-CM | POA: Diagnosis present

## 2016-08-01 DIAGNOSIS — R64 Cachexia: Secondary | ICD-10-CM | POA: Diagnosis present

## 2016-08-01 DIAGNOSIS — E869 Volume depletion, unspecified: Secondary | ICD-10-CM | POA: Diagnosis present

## 2016-08-01 DIAGNOSIS — L89229 Pressure ulcer of left hip, unspecified stage: Secondary | ICD-10-CM | POA: Diagnosis present

## 2016-08-01 DIAGNOSIS — Z96653 Presence of artificial knee joint, bilateral: Secondary | ICD-10-CM

## 2016-08-01 DIAGNOSIS — K219 Gastro-esophageal reflux disease without esophagitis: Secondary | ICD-10-CM | POA: Diagnosis present

## 2016-08-01 DIAGNOSIS — Z79899 Other long term (current) drug therapy: Secondary | ICD-10-CM

## 2016-08-01 DIAGNOSIS — E43 Unspecified severe protein-calorie malnutrition: Secondary | ICD-10-CM | POA: Diagnosis present

## 2016-08-01 DIAGNOSIS — Z881 Allergy status to other antibiotic agents status: Secondary | ICD-10-CM

## 2016-08-01 DIAGNOSIS — Z515 Encounter for palliative care: Secondary | ICD-10-CM | POA: Diagnosis not present

## 2016-08-01 DIAGNOSIS — I96 Gangrene, not elsewhere classified: Secondary | ICD-10-CM | POA: Diagnosis present

## 2016-08-01 DIAGNOSIS — Z87440 Personal history of urinary (tract) infections: Secondary | ICD-10-CM

## 2016-08-01 DIAGNOSIS — M1990 Unspecified osteoarthritis, unspecified site: Secondary | ICD-10-CM | POA: Diagnosis present

## 2016-08-01 DIAGNOSIS — L8945 Pressure ulcer of contiguous site of back, buttock and hip, unstageable: Secondary | ICD-10-CM | POA: Diagnosis not present

## 2016-08-01 DIAGNOSIS — L89309 Pressure ulcer of unspecified buttock, unspecified stage: Secondary | ICD-10-CM | POA: Diagnosis present

## 2016-08-01 DIAGNOSIS — Z800 Family history of malignant neoplasm of digestive organs: Secondary | ICD-10-CM

## 2016-08-01 DIAGNOSIS — Z87891 Personal history of nicotine dependence: Secondary | ICD-10-CM

## 2016-08-01 DIAGNOSIS — Z8711 Personal history of peptic ulcer disease: Secondary | ICD-10-CM

## 2016-08-01 LAB — CBC WITH DIFFERENTIAL/PLATELET
BASOS PCT: 0 %
Basophils Absolute: 0 10*3/uL (ref 0.0–0.1)
EOS ABS: 0 10*3/uL (ref 0.0–0.7)
Eosinophils Relative: 0 %
HCT: 45.5 % (ref 36.0–46.0)
HEMOGLOBIN: 14.7 g/dL (ref 12.0–15.0)
Lymphocytes Relative: 10 %
Lymphs Abs: 1.8 10*3/uL (ref 0.7–4.0)
MCH: 31.6 pg (ref 26.0–34.0)
MCHC: 32.3 g/dL (ref 30.0–36.0)
MCV: 97.8 fL (ref 78.0–100.0)
Monocytes Absolute: 1.5 10*3/uL — ABNORMAL HIGH (ref 0.1–1.0)
Monocytes Relative: 8 %
Neutro Abs: 15.1 10*3/uL — ABNORMAL HIGH (ref 1.7–7.7)
Neutrophils Relative %: 82 %
Platelets: 256 10*3/uL (ref 150–400)
RBC: 4.65 MIL/uL (ref 3.87–5.11)
RDW: 15 % (ref 11.5–15.5)
WBC: 18.4 10*3/uL — AB (ref 4.0–10.5)

## 2016-08-01 LAB — BASIC METABOLIC PANEL
Anion gap: 11 (ref 5–15)
BUN: 85 mg/dL — ABNORMAL HIGH (ref 6–20)
CALCIUM: 10.8 mg/dL — AB (ref 8.9–10.3)
CO2: 17 mmol/L — ABNORMAL LOW (ref 22–32)
CREATININE: 1.05 mg/dL — AB (ref 0.44–1.00)
Chloride: 126 mmol/L — ABNORMAL HIGH (ref 101–111)
GFR calc Af Amer: 53 mL/min — ABNORMAL LOW (ref >60)
GFR calc non Af Amer: 45 mL/min — ABNORMAL LOW (ref >60)
Glucose, Bld: 221 mg/dL — ABNORMAL HIGH (ref 65–99)
Potassium: 4.5 mmol/L (ref 3.5–5.1)
Sodium: 154 mmol/L — ABNORMAL HIGH (ref 135–145)

## 2016-08-01 LAB — LACTIC ACID, PLASMA
Lactic Acid, Venous: 2.8 mmol/L (ref 0.5–1.9)
Lactic Acid, Venous: 2.2 mmol/L (ref 0.5–1.9)

## 2016-08-01 LAB — INFLUENZA PANEL BY PCR (TYPE A & B)
INFLBPCR: NEGATIVE
Influenza A By PCR: NEGATIVE

## 2016-08-01 MED ORDER — SODIUM CHLORIDE 0.9 % IV BOLUS (SEPSIS)
500.0000 mL | Freq: Once | INTRAVENOUS | Status: AC
  Administered 2016-08-01: 500 mL via INTRAVENOUS

## 2016-08-01 NOTE — ED Provider Notes (Signed)
[facility]-EMERGENCY DEPT Provider Note   CSN: [id number] Arrival date & time: 08/01/16  1821     History   Chief Complaint Chief Complaint  Patient presents with  . Cough    HPI Wendy Vasquez is a 83 y.o. female.  HPI Pt BIB EMS. She is from home. Her daughter reports she has had a productive cough since this morning. She has a hx of cognitive disorder. EMS reports she is being treated for a sacral and decubitus ulcer. She has hx of DM. Blood glucose 340 per EMS. She is not on any DM medication. She is a full code. No  acute distress. Past Medical History:  Diagnosis Date  . Arthritis    "all over"  . Asthma    "a touch"  . Bleeding ulcer    requiring transfusions  . Bleeding ulcer   . Daily headache    "last couple days" (04/24/2014)  . Diabetes mellitus without complication (HCC)    "diet controlled"  . GERD (gastroesophageal reflux disease)   . H/O hiatal hernia   . History of blood transfusion    "related to bleeding ulcers"  . HLD (hyperlipidemia)   . Hypertension   . Migraine    hx  . Pneumonia X 1    Patient Active Problem List   Diagnosis Date Noted  . Decubitus ulcer 08/01/2016  . Physical deconditioning 05/21/2014  . Tremors of nervous system 05/21/2014  . Cognitive impairment 05/21/2014  . Dysphagia 05/21/2014  . Anxiety state 05/21/2014  . Esophageal reflux 05/21/2014  . CN (constipation) 05/21/2014  . Viral pneumonitis 04/27/2014  . A-fib (HCC) 04/27/2014  . Pericardial effusion without cardiac tamponade 04/27/2014  . Abnormality of gait 04/27/2014  . SIRS (systemic inflammatory response syndrome) (HCC) 04/23/2014  . HCAP (healthcare-associated pneumonia) 04/23/2014  . Sepsis (HCC) 04/23/2014  . Asthma 04/11/2014  . CAP (community acquired pneumonia) 04/11/2014  . AKI (acute kidney injury) (HCC) 11/12/2013  . HTN (hypertension) 11/10/2013  . HLD (hyperlipidemia) 11/10/2013  . Anemia 11/10/2013  . Syncope 11/09/2013    Past Surgical History:    Procedure Laterality Date  . CATARACT EXTRACTION  "years ago"   "don't know which side; don't know if they put lens in"  . DILATION AND CURETTAGE OF UTERUS     "after she lost a child, I'd assume"  . JOINT REPLACEMENT    . TOTAL KNEE ARTHROPLASTY Bilateral     OB History    No data available       Home Medications    Prior to Admission medications   Medication Sig Start Date End Date Taking? Authorizing Provider  acetaminophen (TYLENOL) 500 MG tablet Take 1,000 mg by mouth 2 (two) times daily as needed for mild pain or headache.    Yes Historical Provider, MD  acidophilus (RISAQUAD) CAPS capsule Take 1 capsule by mouth daily.   Yes Historical Provider, MD  aspirin EC 81 MG tablet Take 81 mg by mouth daily.   Yes Historical Provider, MD  calcium-vitamin D (CALCIUM 500/D) 500-200 MG-UNIT tablet Take 1 tablet by mouth 2 (two) times daily.   Yes Historical Provider, MD  cephALEXin (KEFLEX) 500 MG capsule Take 500 mg by mouth 2 (two) times daily. Started 1/10 x 10 days 07/29/16 08/08/16 Yes Historical Provider, MD  cholecalciferol (VITAMIN D) 1000 units tablet Take 1,000 Units by mouth daily.   Yes Historical Provider, MD  dextromethorphan (DELSYM) 30 MG/5ML liquid Take 60 mg by mouth as needed for cough.  Yes Historical Provider, MD  docusate sodium (COLACE) 100 MG capsule Take 200 mg by mouth at bedtime as needed for mild constipation.    Yes Historical Provider, MD  esomeprazole (NEXIUM) 40 MG capsule Take 40 mg by mouth at bedtime.    Yes Historical Provider, MD  fluticasone (FLONASE) 50 MCG/ACT nasal spray Place 1 spray into both nostrils daily.   Yes Historical Provider, MD  Fluticasone-Salmeterol (ADVAIR) 250-50 MCG/DOSE AEPB Inhale 1 puff into the lungs 2 (two) times daily.   Yes Historical Provider, MD  LORazepam (ATIVAN) 1 MG tablet Take 0.5-1 mg by mouth 2 (two) times daily as needed for anxiety.   Yes Historical Provider, MD  Multiple Vitamin (MULTIVITAMIN WITH MINERALS) TABS  tablet Take 1 tablet by mouth daily.   Yes Historical Provider, MD  nystatin cream (MYCOSTATIN) Apply 1 application topically 2 (two) times daily as needed (for itching/irritation).   Yes Historical Provider, MD  polyethylene glycol (MIRALAX / GLYCOLAX) packet Take 17 g by mouth daily as needed for mild constipation.   Yes Historical Provider, MD  tetrahydrozoline (VISINE) 0.05 % ophthalmic solution Place 1 drop into both eyes as needed (for dry eyes).    Yes Historical Provider, MD  vitamin B-12 (CYANOCOBALAMIN) 1000 MCG tablet Take 1,000 mcg by mouth daily.    Yes Historical Provider, MD  dextromethorphan-guaiFENesin (TUSSIN DM) 10-100 MG/5ML liquid Take 5 mLs by mouth every 4 (four) hours as needed for cough. Patient not taking: Reported on 08/01/2016 07/02/16   Rise Mu, PA-C    Family History Family History  Problem Relation Age of Onset  . Colon cancer Mother     Social History Social History  Substance Use Topics  . Smoking status: Former Smoker    Types: Cigarettes  . Smokeless tobacco: Never Used     Comment: 04/24/2014 "quit smoking in the 1970's or before; didn't smoke much; don't know for how long"  . Alcohol use No     Allergies   Ciprofloxacin and Sulfa antibiotics   Review of Systems Review of Systems  Unable to perform ROS: Dementia     Physical Exam Updated Vital Signs BP 110/71 (BP Location: Right Arm)   Pulse 111   Temp 100.5 F (38.1 C) (Rectal)   Resp (!) 32   SpO2 97%   Physical Exam  Constitutional: She appears well-developed and well-nourished. She appears lethargic. No distress.  HENT:  Head: Normocephalic and atraumatic.  Eyes: Pupils are equal, round, and reactive to light.  Neck: Normal range of motion.  Cardiovascular: Normal rate and intact distal pulses.   Pulmonary/Chest: Effort normal. No respiratory distress. She has wheezes.  Abdominal: Soft. Normal appearance and bowel sounds are normal. She exhibits no distension.    Musculoskeletal: Normal range of motion.       Back:  Neurological: She appears lethargic.  Skin: Skin is warm and dry. Capillary refill takes less than 2 seconds. No rash noted.  Nursing note and vitals reviewed.    ED Treatments / Results  Labs (all labs ordered are listed, but only abnormal results are displayed) Labs Reviewed  BASIC METABOLIC PANEL - Abnormal; Notable for the following:       Result Value   Sodium 154 (*)    Chloride 126 (*)    CO2 17 (*)    Glucose, Bld 221 (*)    BUN 85 (*)    Creatinine, Ser 1.05 (*)    Calcium 10.8 (*)    GFR calc non  Af Amer 45 (*)    GFR calc Af Amer 53 (*)    All other components within normal limits  CBC WITH DIFFERENTIAL/PLATELET - Abnormal; Notable for the following:    WBC 18.4 (*)    Neutro Abs 15.1 (*)    Monocytes Absolute 1.5 (*)    All other components within normal limits  LACTIC ACID, PLASMA - Abnormal; Notable for the following:    Lactic Acid, Venous 2.8 (*)    All other components within normal limits  CULTURE, BLOOD (ROUTINE X 2)  CULTURE, BLOOD (ROUTINE X 2)  URINE CULTURE  INFLUENZA PANEL BY PCR (TYPE A & B, H1N1)  CBC WITH DIFFERENTIAL/PLATELET  LACTIC ACID, PLASMA  URINALYSIS, ROUTINE W REFLEX MICROSCOPIC    EKG  EKG Interpretation None       Radiology Dg Chest 2 View  Result Date: 08/01/2016 CLINICAL DATA:  Pt BIB EMS. She is from home. Her daughter reports she has had a productive cough since this morning. She has a hx of cognitive disorder. EMS reports she is being treated for a sacral and decubitus ulcer. She has hx of DM. Blood glucose 340 per EMS. She is not on any DM medication. She is a full code. No acute distress. Ex-smoker, asthma, diabetic, htn EXAM: CHEST  2 VIEW COMPARISON:  07/02/2016 FINDINGS: Cardiac silhouette is normal in size. No mediastinal or hilar masses or convincing adenopathy. Clear lungs.  No pleural effusion or pneumothorax. IMPRESSION: No acute cardiopulmonary disease.  Electronically Signed   By: Amie Portlandavid  Ormond M.D.   On: 08/01/2016 19:48    Procedures Procedures (including critical care time)  Medications Ordered in ED Medications  sodium chloride 0.9 % bolus 500 mL (500 mLs Intravenous New Bag/Given 08/01/16 2150)     Initial Impression / Assessment and Plan / ED Course  I have reviewed the triage vital signs and the nursing notes.  Pertinent labs & imaging results that were available during my care of the patient were reviewed by me and considered in my medical decision making (see chart for details).  Clinical Course       Final Clinical Impressions(s) / ED Diagnoses   Final diagnoses:  Pressure ulcer of contiguous region involving back and buttock, unspecified laterality, unspecified ulcer stage    New Prescriptions New Prescriptions   No medications on file     Nelva Nayobert [place] Latulippe, MD 08/01/16 2309

## 2016-08-01 NOTE — ED Triage Notes (Signed)
Pt BIB EMS. She is from home. Her daughter reports she has had a productive cough since this morning. She has a hx of cognitive disorder. EMS reports she is being treated for a sacral and decubitus ulcer. She has hx of DM. Blood glucose 340 per EMS. She is not on any DM medication. She is a full code. No  acute distress.

## 2016-08-01 NOTE — H&P (Addendum)
Wendy Vasquez WJX:[id number] DOB: 13-Apr-1924 DOA: 08/01/2016     PCP: Pamelia Hoit, MD   Outpatient Specialists: None Patient coming from:    home Lives  With family    Chief Complaint: Cough shortness of breath fever  HPI: Wendy Vasquez is a 83 y.o. female with medical history significant of  GERD, frequent UTIs, arthritis, dementia, DM 2 diet controlled    Presented with productive cough since this a.m.  Patient has known history of sacral decubitus which at first was on a quarter size but has progressed to baseball size. Patient lives at home with her daughter. At her baseline patient is nonverbal and nonambulatory. She is only able to make eye contact patient herself unable to provide any history. Reports patient had history of pneumonia last year that she required hospitalization. She has recently started on Keflex because she had strong smelling urine. Patient's son in law has been diagnosed with flu 2 days ago.  On EMS arrival blood sugar elevated 340 Regarding pertinent Chronic problems: Patient has mild asthma and diabetes which is diet-controlled patient has remote history of gastric ulcers requiring blood transfusion.   IN ER:  Temp (24hrs), Avg:100.5 F (38.1 C), Min:100.5 F (38.1 C), Max:100.5 F (38.1 C) Meeting  sepsis criteria:  RR 32 satting 97% pulse 111 BP 110/71  WBC 18.4 hemoglobin 14.7 Lactic acid 2.8   Sodium 154 bicarbonate L 17 glucose 221 creatinine 1.05 which is up from baseline of 0.59 Chest x-ray unremarkable She was found to have foul-smelling ulcer of the sacrum Following Medications were ordered in ER: Medications  sodium chloride 0.9 % bolus 500 mL (500 mLs Intravenous New Bag/Given 08/01/16 2150)      Hospitalist was called for admission for Sepsis possibly related to get decubitus ulcer versus viral respiratory illness  Review of Systems:    Pertinent positives include:  Fevers, chills, fatigue  Constitutional:  No weight loss,  night sweats,, weight loss  HEENT:  No headaches, Difficulty swallowing,Tooth/dental problems,Sore throat,  No sneezing, itching, ear ache, nasal congestion, post nasal drip,  Cardio-vascular:  No chest pain, Orthopnea, PND, anasarca, dizziness, palpitations.no Bilateral lower extremity swelling  GI:  No heartburn, indigestion, abdominal pain, nausea, vomiting, diarrhea, change in bowel habits, loss of appetite, melena, blood in stool, hematemesis Resp:  no shortness of breath at rest. No dyspnea on exertion, No excess mucus, no productive cough, No non-productive cough, No coughing up of blood.No change in color of mucus.No wheezing. Skin:  no rash or lesions. No jaundice GU:  no dysuria, change in color of urine, no urgency or frequency. No straining to urinate.  No flank pain.  Musculoskeletal:  No joint pain or no joint swelling. No decreased range of motion. No back pain.  Psych:  No change in mood or affect. No depression or anxiety. No memory loss.  Neuro: no localizing neurological complaints, no tingling, no weakness, no double vision, no gait abnormality, no slurred speech, no confusion  As per HPI otherwise 10 point review of systems negative.   Past Medical History: Past Medical History:  Diagnosis Date  . Arthritis    "all over"  . Asthma    "a touch"  . Bleeding ulcer    requiring transfusions  . Bleeding ulcer   . Daily headache    "last couple days" (04/24/2014)  . Diabetes mellitus without complication (HCC)    "diet controlled"  . GERD (gastroesophageal reflux disease)   . H/O hiatal hernia   .  History of blood transfusion    "related to bleeding ulcers"  . HLD (hyperlipidemia)   . Hypertension   . Migraine    hx  . Pneumonia X 1   Past Surgical History:  Procedure Laterality Date  . CATARACT EXTRACTION  "years ago"   "don't know which side; don't know if they put lens in"  . DILATION AND CURETTAGE OF UTERUS     "after she lost a child, I'd assume"   . JOINT REPLACEMENT    . TOTAL KNEE ARTHROPLASTY Bilateral      Social History:  Ambulatory  bed bound    reports that she has quit smoking. Her smoking use included Cigarettes. She has never used smokeless tobacco. She reports that she does not drink alcohol or use drugs.  Allergies:   Allergies  Allergen Reactions  . Ciprofloxacin Hives  . Sulfa Antibiotics Anaphylaxis       Family History:   Family History  Problem Relation Age of Onset  . Colon cancer Mother     Medications: Prior to Admission medications   Medication Sig Start Date End Date Taking? Authorizing Provider  acetaminophen (TYLENOL) 500 MG tablet Take 1,000 mg by mouth 2 (two) times daily as needed for mild pain or headache.    Yes Historical Provider, MD  acidophilus (RISAQUAD) CAPS capsule Take 1 capsule by mouth daily.   Yes Historical Provider, MD  aspirin EC 81 MG tablet Take 81 mg by mouth daily.   Yes Historical Provider, MD  calcium-vitamin D (CALCIUM 500/D) 500-200 MG-UNIT tablet Take 1 tablet by mouth 2 (two) times daily.   Yes Historical Provider, MD  cephALEXin (KEFLEX) 500 MG capsule Take 500 mg by mouth 2 (two) times daily. Started 1/10 x 10 days 07/29/16 08/08/16 Yes Historical Provider, MD  cholecalciferol (VITAMIN D) 1000 units tablet Take 1,000 Units by mouth daily.   Yes Historical Provider, MD  dextromethorphan (DELSYM) 30 MG/5ML liquid Take 60 mg by mouth as needed for cough.   Yes Historical Provider, MD  docusate sodium (COLACE) 100 MG capsule Take 200 mg by mouth at bedtime as needed for mild constipation.    Yes Historical Provider, MD  esomeprazole (NEXIUM) 40 MG capsule Take 40 mg by mouth at bedtime.    Yes Historical Provider, MD  fluticasone (FLONASE) 50 MCG/ACT nasal spray Place 1 spray into both nostrils daily.   Yes Historical Provider, MD  Fluticasone-Salmeterol (ADVAIR) 250-50 MCG/DOSE AEPB Inhale 1 puff into the lungs 2 (two) times daily.   Yes Historical Provider, MD    LORazepam (ATIVAN) 1 MG tablet Take 0.5-1 mg by mouth 2 (two) times daily as needed for anxiety.   Yes Historical Provider, MD  Multiple Vitamin (MULTIVITAMIN WITH MINERALS) TABS tablet Take 1 tablet by mouth daily.   Yes Historical Provider, MD  nystatin cream (MYCOSTATIN) Apply 1 application topically 2 (two) times daily as needed (for itching/irritation).   Yes Historical Provider, MD  polyethylene glycol (MIRALAX / GLYCOLAX) packet Take 17 g by mouth daily as needed for mild constipation.   Yes Historical Provider, MD  tetrahydrozoline (VISINE) 0.05 % ophthalmic solution Place 1 drop into both eyes as needed (for dry eyes).    Yes Historical Provider, MD  vitamin B-12 (CYANOCOBALAMIN) 1000 MCG tablet Take 1,000 mcg by mouth daily.    Yes Historical Provider, MD  dextromethorphan-guaiFENesin (TUSSIN DM) 10-100 MG/5ML liquid Take 5 mLs by mouth every 4 (four) hours as needed for cough. Patient not taking: Reported on 08/01/2016  07/02/16   Rise Mu, PA-C    Physical Exam: Patient Vitals for the past 24 hrs:  BP Temp Temp src Pulse Resp SpO2  08/01/16 2137 - - - - - 97 %  08/01/16 2059 110/71 - - 111 (!) 32 97 %  08/01/16 1954 - 100.5 F (38.1 C) Rectal - - -  08/01/16 1836 101/65 - - 120 18 94 %  08/01/16 1834 - - - - - 97 %    1. General:  in No Acute distress 2. Psychological: Alert not Oriented 3. Head/ENT:     Dry Mucous Membranes                          Head Non traumatic, neck supple                            Poor Dentition 4. SKIN:  decreased Skin turgor,  Skin clean Dry sacral decubitus ulcer Black eschar unstageable 5. Heart: Regular rate and rhythm no Murmur, Rub or gallop 6. Lungs:   no wheezes or crackles   7. Abdomen: Soft,  non-tender, Non distended 8. Lower extremities: no clubbing, cyanosis, or edema 9. Neurologically Grossly intact, moving all 4 extremities equally   10. MSK: Normal range of motion   body mass index is unknown because there is no  height or weight on file.  Labs on Admission:   Labs on Admission: I have personally reviewed following labs and imaging studies  CBC:  Recent Labs Lab 08/01/16 2039  WBC 18.4*  NEUTROABS 15.1*  HGB 14.7  HCT 45.5  MCV 97.8  PLT 256   Basic Metabolic Panel:  Recent Labs Lab 08/01/16 2003  NA 154*  K 4.5  CL 126*  CO2 17*  GLUCOSE 221*  BUN 85*  CREATININE 1.05*  CALCIUM 10.8*   GFR: CrCl cannot be calculated (Unknown ideal weight.). Liver Function Tests: No results for input(s): AST, ALT, ALKPHOS, BILITOT, PROT, ALBUMIN in the last 168 hours. No results for input(s): LIPASE, AMYLASE in the last 168 hours. No results for input(s): AMMONIA in the last 168 hours. Coagulation Profile: No results for input(s): INR, PROTIME in the last 168 hours. Cardiac Enzymes: No results for input(s): CKTOTAL, CKMB, CKMBINDEX, TROPONINI in the last 168 hours. BNP (last 3 results) No results for input(s): PROBNP in the last 8760 hours. HbA1C: No results for input(s): HGBA1C in the last 72 hours. CBG: No results for input(s): GLUCAP in the last 168 hours. Lipid Profile: No results for input(s): CHOL, HDL, LDLCALC, TRIG, CHOLHDL, LDLDIRECT in the last 72 hours. Thyroid Function Tests: No results for input(s): TSH, T4TOTAL, FREET4, T3FREE, THYROIDAB in the last 72 hours. Anemia Panel: No results for input(s): VITAMINB12, FOLATE, FERRITIN, TIBC, IRON, RETICCTPCT in the last 72 hours.  Sepsis Labs: @LABRCNTIP (procalcitonin:4,lacticidven:4) )No results found for this or any previous visit (from the past 240 hour(s)).     UA   ordered  Lab Results  Component Value Date   HGBA1C 5.4 04/24/2014    CrCl cannot be calculated (Unknown ideal weight.).  BNP (last 3 results) No results for input(s): PROBNP in the last 8760 hours.   ECG REPORT Not obtained  There were no vitals filed for this visit.   Cultures:    Component Value Date/Time   SDES BLOOD LEFT FOREARM  04/24/2014 2215   SPECREQUEST  04/24/2014 2215    BOTTLES DRAWN AEROBIC  AND ANAEROBIC 10CC AER,5CC ANA   CULT  04/24/2014 2215    NO GROWTH 5 DAYS [facility]   REPTSTATUS 05/01/2014 FINAL 04/24/2014 2215     Radiological Exams on Admission: Dg Chest 2 View  Result Date: 08/01/2016 CLINICAL DATA:  Pt BIB EMS. She is from home. Her daughter reports she has had a productive cough since this morning. She has a hx of cognitive disorder. EMS reports she is being treated for a sacral and decubitus ulcer. She has hx of DM. Blood glucose 340 per EMS. She is not on any DM medication. She is a full code. No acute distress. Ex-smoker, asthma, diabetic, htn EXAM: CHEST  2 VIEW COMPARISON:  07/02/2016 FINDINGS: Cardiac silhouette is normal in size. No mediastinal or hilar masses or convincing adenopathy. Clear lungs.  No pleural effusion or pneumothorax. IMPRESSION: No acute cardiopulmonary disease. Electronically Signed   By: Amie Portlandavid  Ormond M.D.   On: 08/01/2016 19:48    Chart has been reviewed    Assessment/Plan  83 y.o. female with medical history significant of  GERD, frequent UTIs, arthritis, dementia, DM 2 diet controlled admitted for sepsis likely secondary to viral illness versus decubitus ulcer Present on Admission:    . Sepsis (HCC)- treat with IV fluids obtain blood cultures source possibly viral illness versus decubitus ulcer. Influenza PCR negative. Obtain respiratory panel . Decubitus ulcer - wound care consult, with antibiotics . A-fib [facility](HCC) patient not a candidate for anticoagulation given advanced age and multiple comorbidities including history of GI bleed per family she has been in sinus rhythm for years . HTN (hypertension)- hold by mouth medications for now and observe Deconditioning patient has not walked in months. We'll probably need home health to help manage wounds . Asthma mild administer albuterol as needed Diabetes mellitus we'll order sliding  scale check hemoglobin A1c and TSH Other plan as per orders.  DVT prophylaxis:    Lovenox     Code Status:    DNR/DNI   as per  family   Family Communication:   Family not  at  Bedside  plan of care was discussed with  Daughter,  Disposition Plan:     To home once workup is complete and patient is stable family preference                                               Consults called: none   Admission status:    inpatient    and extensive medical problems will likely require prolonged stay   Level of care   tele          I have spent a total of 57 min on this admission   Devynn Hessler 08/01/2016, 11:20 PM    [facility]  Pager [phone]   after 2 AM please page floor coverage PA If 7AM-7PM, please contact the day team taking care of the patient  [email or web address]  Password [email or web address]

## 2016-08-01 NOTE — ED Notes (Signed)
Bed: [id number] Expected date:  Expected time:  Means of arrival:  Comments: 83 yo productive cough

## 2016-08-02 ENCOUNTER — Encounter (HOSPITAL_COMMUNITY): Payer: Self-pay

## 2016-08-02 DIAGNOSIS — F0391 Unspecified dementia with behavioral disturbance: Secondary | ICD-10-CM

## 2016-08-02 LAB — BASIC METABOLIC PANEL
ANION GAP: 9 (ref 5–15)
ANION GAP: 10 (ref 5–15)
Anion gap: 9 (ref 5–15)
Anion gap: 10 (ref 5–15)
BUN: 70 mg/dL — AB (ref 6–20)
BUN: 66 mg/dL — AB (ref 6–20)
BUN: 60 mg/dL — ABNORMAL HIGH (ref 6–20)
BUN: 59 mg/dL — ABNORMAL HIGH (ref 6–20)
CALCIUM: 9.7 mg/dL (ref 8.9–10.3)
CALCIUM: 9.6 mg/dL (ref 8.9–10.3)
CHLORIDE: 129 mmol/L — AB (ref 101–111)
CHLORIDE: 129 mmol/L — AB (ref 101–111)
CO2: 21 mmol/L — ABNORMAL LOW (ref 22–32)
CO2: 20 mmol/L — ABNORMAL LOW (ref 22–32)
CO2: 22 mmol/L (ref 22–32)
CO2: 20 mmol/L — ABNORMAL LOW (ref 22–32)
CREATININE: 0.9 mg/dL (ref 0.44–1.00)
CREATININE: 1.03 mg/dL — AB (ref 0.44–1.00)
Calcium: 10.2 mg/dL (ref 8.9–10.3)
Calcium: 10 mg/dL (ref 8.9–10.3)
Chloride: 125 mmol/L — ABNORMAL HIGH (ref 101–111)
Chloride: 124 mmol/L — ABNORMAL HIGH (ref 101–111)
Creatinine, Ser: 0.98 mg/dL (ref 0.44–1.00)
Creatinine, Ser: 1.02 mg/dL — ABNORMAL HIGH (ref 0.44–1.00)
GFR calc Af Amer: >60 mL/min (ref >60)
GFR calc Af Amer: 54 mL/min — ABNORMAL LOW (ref >60)
GFR calc Af Amer: 54 mL/min — ABNORMAL LOW (ref >60)
GFR calc non Af Amer: 46 mL/min — ABNORMAL LOW (ref >60)
GFR, EST AFRICAN AMERICAN: 57 mL/min — AB (ref >60)
GFR, EST NON AFRICAN AMERICAN: 55 mL/min — AB (ref >60)
GFR, EST NON AFRICAN AMERICAN: 49 mL/min — AB (ref >60)
GFR, EST NON AFRICAN AMERICAN: 47 mL/min — AB (ref >60)
GLUCOSE: 133 mg/dL — AB (ref 65–99)
GLUCOSE: 152 mg/dL — AB (ref 65–99)
GLUCOSE: 205 mg/dL — AB (ref 65–99)
GLUCOSE: 208 mg/dL — AB (ref 65–99)
Potassium: 4 mmol/L (ref 3.5–5.1)
Potassium: 3.7 mmol/L (ref 3.5–5.1)
Potassium: 3.9 mmol/L (ref 3.5–5.1)
Potassium: 3.6 mmol/L (ref 3.5–5.1)
SODIUM: 159 mmol/L — AB (ref 135–145)
SODIUM: 159 mmol/L — AB (ref 135–145)
SODIUM: 156 mmol/L — AB (ref 135–145)
Sodium: 154 mmol/L — ABNORMAL HIGH (ref 135–145)

## 2016-08-02 LAB — COMPREHENSIVE METABOLIC PANEL
ALBUMIN: 2.9 g/dL — AB (ref 3.5–5.0)
ALK PHOS: 83 U/L (ref 38–126)
ALT: 32 U/L (ref 14–54)
AST: 32 U/L (ref 15–41)
Anion gap: 8 (ref 5–15)
BILIRUBIN TOTAL: 0.8 mg/dL (ref 0.3–1.2)
BUN: 74 mg/dL — AB (ref 6–20)
CALCIUM: 10.2 mg/dL (ref 8.9–10.3)
CO2: 22 mmol/L (ref 22–32)
Chloride: 126 mmol/L — ABNORMAL HIGH (ref 101–111)
Creatinine, Ser: 0.99 mg/dL (ref 0.44–1.00)
GFR calc Af Amer: 56 mL/min — ABNORMAL LOW (ref >60)
GFR calc non Af Amer: 49 mL/min — ABNORMAL LOW (ref >60)
GLUCOSE: 222 mg/dL — AB (ref 65–99)
POTASSIUM: 4 mmol/L (ref 3.5–5.1)
SODIUM: 156 mmol/L — AB (ref 135–145)
TOTAL PROTEIN: 6.7 g/dL (ref 6.5–8.1)

## 2016-08-02 LAB — RESPIRATORY PANEL BY PCR
Adenovirus: NOT DETECTED
BORDETELLA PERTUSSIS-RVPCR: NOT DETECTED
Chlamydophila pneumoniae: NOT DETECTED
Coronavirus 229E: NOT DETECTED
Coronavirus HKU1: NOT DETECTED
Coronavirus NL63: NOT DETECTED
Coronavirus OC43: NOT DETECTED
INFLUENZA B-RVPPCR: NOT DETECTED
Influenza A: NOT DETECTED
MYCOPLASMA PNEUMONIAE-RVPPCR: NOT DETECTED
Metapneumovirus: NOT DETECTED
Parainfluenza Virus 1: NOT DETECTED
Parainfluenza Virus 2: NOT DETECTED
Parainfluenza Virus 3: NOT DETECTED
Parainfluenza Virus 4: NOT DETECTED
RESPIRATORY SYNCYTIAL VIRUS-RVPPCR: NOT DETECTED
Rhinovirus / Enterovirus: NOT DETECTED

## 2016-08-02 LAB — CBC WITH DIFFERENTIAL/PLATELET
BASOS ABS: 0 10*3/uL (ref 0.0–0.1)
BASOS PCT: 0 %
EOS ABS: 0 10*3/uL (ref 0.0–0.7)
Eosinophils Relative: 0 %
HEMATOCRIT: 41.4 % (ref 36.0–46.0)
HEMOGLOBIN: 12.7 g/dL (ref 12.0–15.0)
Lymphocytes Relative: 12 %
Lymphs Abs: 1.9 10*3/uL (ref 0.7–4.0)
MCH: 31.1 pg (ref 26.0–34.0)
MCHC: 30.7 g/dL (ref 30.0–36.0)
MCV: 101.5 fL — ABNORMAL HIGH (ref 78.0–100.0)
Monocytes Absolute: 1.2 10*3/uL — ABNORMAL HIGH (ref 0.1–1.0)
Monocytes Relative: 7 %
NEUTROS ABS: 12.7 10*3/uL — AB (ref 1.7–7.7)
NEUTROS PCT: 81 %
Platelets: 207 10*3/uL (ref 150–400)
RBC: 4.08 MIL/uL (ref 3.87–5.11)
RDW: 15.5 % (ref 11.5–15.5)
WBC: 15.8 10*3/uL — AB (ref 4.0–10.5)

## 2016-08-02 LAB — URINALYSIS, ROUTINE W REFLEX MICROSCOPIC
BILIRUBIN URINE: NEGATIVE
Glucose, UA: NEGATIVE mg/dL
Ketones, ur: NEGATIVE mg/dL
Nitrite: NEGATIVE
Protein, ur: 100 mg/dL — AB
SPECIFIC GRAVITY, URINE: 1.018 (ref 1.005–1.030)
pH: 5 (ref 5.0–8.0)

## 2016-08-02 LAB — LACTIC ACID, PLASMA
Lactic Acid, Venous: 2.7 mmol/L (ref 0.5–1.9)
Lactic Acid, Venous: 2 mmol/L (ref 0.5–1.9)

## 2016-08-02 LAB — MAGNESIUM: MAGNESIUM: 2.6 mg/dL — AB (ref 1.7–2.4)

## 2016-08-02 LAB — GLUCOSE, CAPILLARY
GLUCOSE-CAPILLARY: 156 mg/dL — AB (ref 65–99)
GLUCOSE-CAPILLARY: 116 mg/dL — AB (ref 65–99)
GLUCOSE-CAPILLARY: 125 mg/dL — AB (ref 65–99)
GLUCOSE-CAPILLARY: 158 mg/dL — AB (ref 65–99)
Glucose-Capillary: 152 mg/dL — ABNORMAL HIGH (ref 65–99)
Glucose-Capillary: 174 mg/dL — ABNORMAL HIGH (ref 65–99)

## 2016-08-02 LAB — PROCALCITONIN: Procalcitonin: 0.16 ng/mL

## 2016-08-02 LAB — PHOSPHORUS: PHOSPHORUS: 2.1 mg/dL — AB (ref 2.5–4.6)

## 2016-08-02 LAB — MRSA PCR SCREENING: MRSA BY PCR: NEGATIVE

## 2016-08-02 LAB — TSH: TSH: 0.911 u[IU]/mL (ref 0.350–4.500)

## 2016-08-02 MED ORDER — HYDROCODONE-ACETAMINOPHEN 5-325 MG PO TABS
1.0000 | ORAL_TABLET | ORAL | Status: DC | PRN
  Administered 2016-08-03 (×2): 1 via ORAL
  Filled 2016-08-02 (×2): qty 1

## 2016-08-02 MED ORDER — SODIUM CHLORIDE 0.9 % IV BOLUS (SEPSIS)
500.0000 mL | Freq: Once | INTRAVENOUS | Status: AC
  Administered 2016-08-02: 500 mL via INTRAVENOUS

## 2016-08-02 MED ORDER — ONDANSETRON HCL 4 MG PO TABS: 4.0000 mg | ORAL_TABLET | Freq: Four times a day (QID) | ORAL | Status: DC | PRN

## 2016-08-02 MED ORDER — ONDANSETRON HCL 4 MG/2ML IJ SOLN: 4.0000 mg | Freq: Four times a day (QID) | INTRAMUSCULAR | Status: DC | PRN

## 2016-08-02 MED ORDER — GUAIFENESIN ER 600 MG PO TB12
600.0000 mg | ORAL_TABLET | Freq: Two times a day (BID) | ORAL | Status: DC
  Administered 2016-08-03: 600 mg via ORAL
  Filled 2016-08-02 (×5): qty 1

## 2016-08-02 MED ORDER — ORAL CARE MOUTH RINSE
15.0000 mL | Freq: Two times a day (BID) | OROMUCOSAL | Status: DC
  Administered 2016-08-02 – 2016-08-06 (×6): 15 mL via OROMUCOSAL

## 2016-08-02 MED ORDER — INSULIN ASPART 100 UNIT/ML ~~LOC~~ SOLN
0.0000 [IU] | SUBCUTANEOUS | Status: DC
  Administered 2016-08-02 (×2): 2 [IU] via SUBCUTANEOUS
  Administered 2016-08-02: 1 [IU] via SUBCUTANEOUS
  Administered 2016-08-02 (×2): 2 [IU] via SUBCUTANEOUS
  Administered 2016-08-03 (×2): 1 [IU] via SUBCUTANEOUS
  Administered 2016-08-03 – 2016-08-04 (×3): 2 [IU] via SUBCUTANEOUS

## 2016-08-02 MED ORDER — ALBUTEROL SULFATE (2.5 MG/3ML) 0.083% IN NEBU: 2.5000 mg | INHALATION_SOLUTION | RESPIRATORY_TRACT | Status: DC | PRN

## 2016-08-02 MED ORDER — SODIUM CHLORIDE 0.9 % IV SOLN
INTRAVENOUS | Status: AC
  Administered 2016-08-02: 02:00:00 via INTRAVENOUS

## 2016-08-02 MED ORDER — SODIUM CHLORIDE 0.9% FLUSH
3.0000 mL | Freq: Two times a day (BID) | INTRAVENOUS | Status: DC
  Administered 2016-08-02 – 2016-08-07 (×9): 3 mL via INTRAVENOUS

## 2016-08-02 MED ORDER — SODIUM CHLORIDE 0.9 % IV SOLN
INTRAVENOUS | Status: DC
  Administered 2016-08-02 – 2016-08-04 (×2): via INTRAVENOUS

## 2016-08-02 MED ORDER — VANCOMYCIN HCL IN DEXTROSE 1-5 GM/200ML-% IV SOLN
1000.0000 mg | Freq: Once | INTRAVENOUS | Status: AC
Start: 2016-08-02 — End: 2016-08-02
  Administered 2016-08-02: 1000 mg via INTRAVENOUS
  Filled 2016-08-02: qty 200

## 2016-08-02 MED ORDER — LORAZEPAM 0.5 MG PO TABS: 0.5000 mg | ORAL_TABLET | Freq: Two times a day (BID) | ORAL | Status: DC | PRN

## 2016-08-02 MED ORDER — ACETAMINOPHEN 650 MG RE SUPP: 650.0000 mg | Freq: Four times a day (QID) | RECTAL | Status: DC | PRN

## 2016-08-02 MED ORDER — PIPERACILLIN-TAZOBACTAM 3.375 G IVPB
3.3750 g | Freq: Three times a day (TID) | INTRAVENOUS | Status: DC
  Administered 2016-08-02 – 2016-08-04 (×7): 3.375 g via INTRAVENOUS
  Filled 2016-08-02 (×6): qty 50

## 2016-08-02 MED ORDER — DAKINS (1/4 STRENGTH) 0.125 % EX SOLN
Freq: Two times a day (BID) | CUTANEOUS | Status: AC
  Administered 2016-08-02 – 2016-08-05 (×6)
  Filled 2016-08-02: qty 473

## 2016-08-02 MED ORDER — ASPIRIN EC 81 MG PO TBEC
81.0000 mg | DELAYED_RELEASE_TABLET | Freq: Every day | ORAL | Status: DC
  Administered 2016-08-03: 81 mg via ORAL
  Filled 2016-08-02 (×3): qty 1

## 2016-08-02 MED ORDER — ENOXAPARIN SODIUM 30 MG/0.3ML ~~LOC~~ SOLN
30.0000 mg | SUBCUTANEOUS | Status: DC
  Administered 2016-08-02 – 2016-08-04 (×3): 30 mg via SUBCUTANEOUS
  Filled 2016-08-02 (×3): qty 0.3

## 2016-08-02 MED ORDER — SODIUM CHLORIDE 0.45 % IV SOLN
INTRAVENOUS | Status: DC
  Administered 2016-08-02: 12:00:00 via INTRAVENOUS

## 2016-08-02 MED ORDER — VANCOMYCIN HCL 500 MG IV SOLR
500.0000 mg | INTRAVENOUS | Status: DC
  Administered 2016-08-03 – 2016-08-04 (×2): 500 mg via INTRAVENOUS
  Filled 2016-08-02 (×2): qty 500

## 2016-08-02 MED ORDER — CHLORHEXIDINE GLUCONATE 0.12 % MT SOLN
15.0000 mL | Freq: Two times a day (BID) | OROMUCOSAL | Status: DC
  Administered 2016-08-02 – 2016-08-07 (×10): 15 mL via OROMUCOSAL
  Filled 2016-08-02 (×7): qty 15

## 2016-08-02 MED ORDER — DEXTROSE 5 % IV SOLN
INTRAVENOUS | Status: DC
  Administered 2016-08-02 – 2016-08-03 (×3): via INTRAVENOUS

## 2016-08-02 MED ORDER — PIPERACILLIN-TAZOBACTAM 3.375 G IVPB 30 MIN
3.3750 g | Freq: Once | INTRAVENOUS | Status: AC
  Administered 2016-08-02: 3.375 g via INTRAVENOUS
  Filled 2016-08-02 (×2): qty 50

## 2016-08-02 MED ORDER — ACETAMINOPHEN 325 MG PO TABS: 650.0000 mg | ORAL_TABLET | Freq: Four times a day (QID) | ORAL | Status: DC | PRN

## 2016-08-02 MED ORDER — MOMETASONE FURO-FORMOTEROL FUM 200-5 MCG/ACT IN AERO
2.0000 | INHALATION_SPRAY | Freq: Two times a day (BID) | RESPIRATORY_TRACT | Status: DC
  Filled 2016-08-02: qty 8.8

## 2016-08-02 NOTE — Progress Notes (Signed)
PROGRESS NOTE    Wendy Vasquez  OVF:[id number] DOB: March 21, 1924 DOA: 08/01/2016 PCP: Pamelia HoitWILSON,FRED HENRY, MD    Brief Narrative:  Wendy Vasquez is a 83 y.o. female with medical history significant of  GERD, frequent UTIs, arthritis, dementia, DM 2 diet controlled    Presented with productive cough since this a.m.  Patient has known history of sacral decubitus which at first was on a quarter size but has progressed to baseball size. Patient lives at home with her daughter. At her baseline patient is nonverbal and nonambulatory. She is only able to make eye contact patient herself unable to provide any history. Reports patient had history of pneumonia last year that she required hospitalization. She has recently started on Keflex because she had strong smelling urine. Patient's son in law has been diagnosed with flu 2 days ago. On EMS arrival blood sugar elevated 340 Regarding pertinent Chronic problems: Patient has mild asthma and diabetes which is diet-controlled patient has remote history of gastric ulcers requiring blood transfusion.   Assessment & Plan:   Active Problems:   HTN (hypertension)   Asthma   Sepsis (HCC)   A-fib (HCC)   Decubitus ulcer   . Sepsis (HCC) - treat with IV fluids - blood cultures pending - Influenza PCR negative - respiratory panel negative - continue IVF  - per daughter patient appears to have somewhat "improved"  Hypernatremia - BMP q4h - change to 0.45% NS - continue to follow  Decubitus ulcer  - wound care consult - continue antibiotics of zosyn and vancomycin  A-fib (HCC)  - patient not a candidate for anticoagulation given advanced age and multiple comorbidities including history of GI bleed per family she has been in sinus rhythm for years  HTN (hypertension) - hold by mouth medications for now and observe - blood pressures seem reasonably controlled  Deconditioning patient has not walked in months - will need home health and can be  seen by PT/OT but unclear how mobile patient is  Asthma mild  - administer albuterol as needed   Diabetes mellitus  - order sliding scale  - check hemoglobin A1c  Goals of care - discussed illness of patient and disease progression with daughter - daughter would like to continue antibiotics and carefully decrease sodium - daughter understands patient has been declining - may benefit from palliative care consult in future   DVT prophylaxis:    Lovenox    Code Status:    DNR/DNI   as per  family  Family Communication:   Daughter Tammy is bedside Disposition Plan: guarded at this time   Consultants:   None  Procedures:   None  Antimicrobials:   Zosyn  Vancomycin    Subjective: Patient nonverbal.  Eyes are somewhat open but she does not track.  She does not make any communicative gestures.   Objective: Vitals:   08/01/16 2137 08/01/16 2316 08/02/16 0025 08/02/16 0434  BP:  (!) 95/53 119/71 112/67  Pulse:  96 100 87  Resp:  23 20 20   Temp:   97.8 F (36.6 C) 97.5 F (36.4 C)  TempSrc:   Oral Oral  SpO2: 97% 97% 99% 97%  Weight:   50.4 kg (111 lb 1.8 oz)   Height:   5\' 3"  (1.6 m)     Intake/Output Summary (Last 24 hours) at 08/02/16 1124 Last data filed at 08/02/16 0600  Gross per 24 hour  Intake           583.75 ml  Output              300 ml  Net           283.75 ml   Filed Weights   08/02/16 0025  Weight: 50.4 kg (111 lb 1.8 oz)    Examination:  General exam: Appears calm and comfortable  Respiratory system: Clear to auscultation. Respiratory effort normal. Cardiovascular system: S1 & S2 heard, RRR. No JVD, murmurs, rubs, gallops or clicks. No pedal edema. Gastrointestinal system: Abdomen is nondistended, soft and nontender. No organomegaly or masses felt. Normal bowel sounds heard. Central nervous system: unclear if patient is alert; unable to assess CN Extremities: patient did not move arms or legs during exam Skin: per previous notes  patient has sacral decubitus ulcer, per nursing staff numerous other ulcers/ wounds noticed, patient has ulcers and significant dryness in mouth and on tongue Psychiatry: unable to assess.     Data Reviewed: I have personally reviewed following labs and imaging studies  CBC:  Recent Labs Lab 08/01/16 2039 08/02/16 0242  WBC 18.4* 15.8*  NEUTROABS 15.1* 12.7*  HGB 14.7 12.7  HCT 45.5 41.4  MCV 97.8 101.5*  PLT 256 207   Basic Metabolic Panel:  Recent Labs Lab 08/01/16 2003 08/02/16 0242 08/02/16 0959  NA 154* 156* 159*  K 4.5 4.0 4.0  CL 126* 126* 129*  CO2 17* 22 21*  GLUCOSE 221* 222* 133*  BUN 85* 74* 70*  CREATININE 1.05* 0.99 0.90  CALCIUM 10.8* 10.2 10.2  MG  --  2.6*  --   PHOS  --  2.1*  --    GFR: Estimated Creatinine Clearance: 33.1 mL/min (by C-G formula based on SCr of 0.9 mg/dL). Liver Function Tests:  Recent Labs Lab 08/02/16 0242  AST 32  ALT 32  ALKPHOS 83  BILITOT 0.8  PROT 6.7  ALBUMIN 2.9*   No results for input(s): LIPASE, AMYLASE in the last 168 hours. No results for input(s): AMMONIA in the last 168 hours. Coagulation Profile: No results for input(s): INR, PROTIME in the last 168 hours. Cardiac Enzymes: No results for input(s): CKTOTAL, CKMB, CKMBINDEX, TROPONINI in the last 168 hours. BNP (last 3 results) No results for input(s): PROBNP in the last 8760 hours. HbA1C: No results for input(s): HGBA1C in the last 72 hours. CBG:  Recent Labs Lab 08/02/16 0125 08/02/16 0429 08/02/16 0819  GLUCAP 152* 156* 116*   Lipid Profile: No results for input(s): CHOL, HDL, LDLCALC, TRIG, CHOLHDL, LDLDIRECT in the last 72 hours. Thyroid Function Tests:  Recent Labs  08/02/16 0242  TSH 0.911   Anemia Panel: No results for input(s): VITAMINB12, FOLATE, FERRITIN, TIBC, IRON, RETICCTPCT in the last 72 hours. Sepsis Labs:  Recent Labs Lab 08/01/16 2039 08/01/16 2240 08/02/16 0242 08/02/16 0249 08/02/16 0557  PROCALCITON  --    --  0.16  --   --   LATICACIDVEN 2.8* 2.2*  --  2.7* 2.0*    Recent Results (from the past 240 hour(s))  MRSA PCR Screening     Status: None   Collection Time: 08/02/16  1:30 AM  Result Value Ref Range Status   MRSA by PCR NEGATIVE NEGATIVE Final    Comment:        The GeneXpert MRSA Assay (FDA approved for NASAL specimens only), is one component of a comprehensive MRSA colonization surveillance program. It is not intended to diagnose MRSA infection nor to guide or monitor treatment for MRSA infections.  Radiology Studies: Dg Chest 2 View  Result Date: 08/01/2016 CLINICAL DATA:  Pt BIB EMS. She is from home. Her daughter reports she has had a productive cough since this morning. She has a hx of cognitive disorder. EMS reports she is being treated for a sacral and decubitus ulcer. She has hx of DM. Blood glucose 340 per EMS. She is not on any DM medication. She is a full code. No acute distress. Ex-smoker, asthma, diabetic, htn EXAM: CHEST  2 VIEW COMPARISON:  07/02/2016 FINDINGS: Cardiac silhouette is normal in size. No mediastinal or hilar masses or convincing adenopathy. Clear lungs.  No pleural effusion or pneumothorax. IMPRESSION: No acute cardiopulmonary disease. Electronically Signed   By: Amie Portland M.D.   On: 08/01/2016 19:48        Scheduled Meds: . aspirin EC  81 mg Oral Daily  . enoxaparin (LOVENOX) injection  30 mg Subcutaneous Q24H  . guaiFENesin  600 mg Oral BID  . insulin aspart  0-9 Units Subcutaneous Q4H  . mometasone-formoterol  2 puff Inhalation BID  . piperacillin-tazobactam (ZOSYN)  IV  3.375 g Intravenous Q8H  . sodium chloride  500 mL Intravenous Once  . sodium chloride flush  3 mL Intravenous Q12H  . [START ON 08/03/2016] vancomycin  500 mg Intravenous Q24H   Continuous Infusions:   LOS: 1 day    Time spent: 35 minutes    Katrinka Blazing, MD [facility] Pager [phone]  If 7PM-7AM, please contact  night-coverage [email or web address] Password [facility] 08/02/2016, 11:24 AM

## 2016-08-02 NOTE — Progress Notes (Signed)
CRITICAL VALUE ALERT  Critical value received:  Lactic Acid 2.7  Date of notification:  08/02/12  Time of notification:  0345  Critical value read back:Yes.    Nurse who received alert:  Reino KentMelissa OBrian  MD notified (1st page):  [place]  Time of first page:  [phone]

## 2016-08-02 NOTE — Evaluation (Signed)
Clinical/Bedside Swallow Evaluation Patient Details  Name: Wendy Vasquez MRN: [id number] Date of Birth: May 12, 1924  Today's Date: 08/02/2016 Time: SLP Start Time (ACUTE ONLY): 1504 SLP Stop Time (ACUTE ONLY): 1544 SLP Time Calculation (min) (ACUTE ONLY): 40 min  Past Medical History:  Past Medical History:  Diagnosis Date  . Arthritis    "all over"  . Asthma    "a touch"  . Bleeding ulcer    requiring transfusions  . Bleeding ulcer   . Daily headache    "last couple days" (04/24/2014)  . Diabetes mellitus without complication (HCC)    "diet controlled"  . GERD (gastroesophageal reflux disease)   . H/O hiatal hernia   . History of blood transfusion    "related to bleeding ulcers"  . HLD (hyperlipidemia)   . Hypertension   . Migraine    hx  . Pneumonia X 1   Past Surgical History:  Past Surgical History:  Procedure Laterality Date  . CATARACT EXTRACTION  "years ago"   "don't know which side; don't know if they put lens in"  . DILATION AND CURETTAGE OF UTERUS     "after she lost a child, I'd assume"  . JOINT REPLACEMENT    . TOTAL KNEE ARTHROPLASTY Bilateral    HPI:  Wendy Cragun Hicksis a 83 y.o.femalewith medical history significant of GERD, frequent UTIs, arthritis, dementia, DM 2 diet controlled. Patient has known history of sacral decubitus which at first was on a quarter size but has progressed to baseball size. Patient lives at home with her daughter. At her baseline patient is nonverbal and nonambulatory. She is only able to make eye contact patient herself unable to provide any history. Reports patient had history of pneumonia last year that she required hospitalization. She has recently started on Keflex because she had strong smelling urine. Patient's son in law has been diagnosed with flu 2 days ago. On EMS arrival blood sugar elevated 340 Chest x ray without acute cardiopulmonary findings.   Assessment / Plan / Recommendation Clinical Impression  Pt presents with  moderate to severe oropharyngeal dysphagia of cognitive etiology. Pt with advanced dementia, unable to follow commands and is nonverbal. Full feeding assistance required throughout PO trials. Daughter present for education and concerns for high risk of aspiration. Pt with poor oral hygiene: xerostomic oral cavity with excessive dried secretions, improving greatly with oral care from SLP. Pt with limited response to sensory stimulation to anterior faucial pillars of oral cavity during oral care and displayed increased secretions at posterior oral cavity/anterior portion of pharynx. Pt with suspected signficant delay in swallow initiation, through SLP palpation and intermittent coughing across thin and nectar thick liquid trials. Anterior spillage noted via cup sip of liquid POs. Delayed AP transit exhibited across PO consistencies with poor bolus awareness, though puree and honey thick trials ellicited greatest sensory response from pt. Safest PO diet option is dysphagia 1 (puree) and honey thick liquids with medicines crushed in puree though pt continues to remain at increased risk for silent aspiration. SLP to closely monitor for diet tolerance/goals of care. Daughter educated extensively regarding diet recommendation and aspiration precautions.       Aspiration Risk  Severe aspiration risk;Risk for inadequate nutrition/hydration    Diet Recommendation   Dysphagia 1 (puree) Honey thick liquids; Total assist for feeding from trained caregiver  Medication Administration: Crushed with puree    Other  Recommendations Oral Care Recommendations: Oral care BID Other Recommendations: Order thickener from pharmacy   Follow up  Recommendations 24 hour supervision/assistance      Frequency and Duration min 2x/week  1 week       Prognosis Prognosis for Safe Diet Advancement: Guarded Barriers to Reach Goals: Severity of deficits;Cognitive deficits      Swallow Study   General Date of Onset:  08/01/16 HPI: [facility] a 83 y.o.femalewith medical history significant of GERD, frequent UTIs, arthritis, dementia, DM 2 diet controlled. Patient has known history of sacral decubitus which at first was on a quarter size but has progressed to baseball size. Patient lives at home with her daughter. At her baseline patient is nonverbal and nonambulatory. She is only able to make eye contact patient herself unable to provide any history. Reports patient had history of pneumonia last year that she required hospitalization. She has recently started on Keflex because she had strong smelling urine. Patient's son in law has been diagnosed with flu 2 days ago. On EMS arrival blood sugar elevated 340 Chest x ray without acute cardiopulmonary findings. Type of Study: Bedside Swallow Evaluation Previous Swallow Assessment: BSE 2015 Reg/ thin diet Diet Prior to this Study: Regular;Thin liquids Temperature Spikes Noted: Yes Respiratory Status: Room air History of Recent Intubation: No Behavior/Cognition: Alert;Requires cueing;Doesn't follow directions;Confused Oral Cavity Assessment: Dry;Dried secretions;Excessive secretions Oral Care Completed by SLP: Yes Oral Cavity - Dentition: Missing dentition Vision: Impaired for self-feeding Self-Feeding Abilities: Total assist Patient Positioning: Upright in bed Baseline Vocal Quality: Not observed;Other (comment) (pt primarily nonverbal secondary to advanced dementia) Volitional Cough: Cognitively unable to elicit Volitional Swallow: Unable to elicit    Oral/Motor/Sensory Function Overall Oral Motor/Sensory Function: Other (comment) (cognitively unable to follow commands for formal OME)   Ice Chips Ice chips: Impaired Presentation: Cup Oral Phase Impairments: Reduced labial seal;Poor awareness of bolus Oral Phase Functional Implications: Prolonged oral transit;Oral holding Pharyngeal Phase Impairments: Suspected delayed Swallow;Unable to trigger swallow    Thin Liquid Thin Liquid: Impaired Presentation: Cup Oral Phase Impairments: Reduced labial seal;Poor awareness of bolus;Reduced lingual movement/coordination Oral Phase Functional Implications: Prolonged oral transit;Oral holding Pharyngeal  Phase Impairments: Suspected delayed Swallow;Multiple swallows;Cough - Delayed    Nectar Thick Nectar Thick Liquid: Impaired Presentation: Cup Oral Phase Impairments: Reduced labial seal;Poor awareness of bolus Oral phase functional implications: Prolonged oral transit;Oral holding Pharyngeal Phase Impairments: Suspected delayed Swallow;Decreased hyoid-laryngeal movement;Multiple swallows   Honey Thick Honey Thick Liquid: Impaired Presentation: Cup Oral Phase Impairments: Reduced labial seal;Poor awareness of bolus Oral Phase Functional Implications: Prolonged oral transit Pharyngeal Phase Impairments: Suspected delayed Swallow;Multiple swallows   Puree Puree: Impaired Presentation: Self Fed Oral Phase Impairments: Poor awareness of bolus;Reduced lingual movement/coordination;Reduced labial seal Oral Phase Functional Implications: Prolonged oral transit;Oral residue;Left lateral sulci pocketing;Right lateral sulci pocketing;Oral holding Pharyngeal Phase Impairments: Suspected delayed Swallow;Multiple swallows   Solid   GO   Solid: Not tested       Marcene Duoshelsea Sumney MA, CCC-SLP Acute Care Speech Language Pathologist    Kennieth RadSumney, Brailyn Delman E 08/02/2016,3:57 PM

## 2016-08-02 NOTE — Consult Note (Signed)
WOC Nurse wound consult note Reason for Consult: Large Unstageable Pressure Injury to sacrum, also deep tissue pressure injuries to thoracic spine and bilateral ischial tuberosities. Heels intact. Patient's daughter, who is a CNA, reports that her mother "stays in a recliner chair" and that she "changes her position in that chair really often". States that appetite has diminished over the past 3 days.  [facility] but no [facility] had availability.  Wound type:Pressure Pressure Injury POA: Yes Measurement: Large Sacral Unstageable Pressure Injury measures 9cm x 10cm: black firmly adherent eschar with small opening at apex measuring 1cm x 0.8cm. Malodorous; odor is consistent with necrotic tissue. Liquefaction of subcutaneous tissues suspected, fluctuance noted. Scant brown drainage from superior (apex) aspect of wound (12 o'clock). Mid-thoracic deep tissue pressure injury (DTPI):  3cm x 0.4cm with no depth. Purple/maroon discoloration of tissue. Bilateral ischial tuberosity deep tissue pressure injuries: 2cm x 3cm with peeling epidermis, <0.1cm depth.  Purple/maroon discoloration of skin Wound bed: As described above Drainage (amount, consistency, odor) As noted above. Periwound:Intact, dry. Dressing procedure/placement/frequency: Patient was placed on a therapeutic mattress with low air loss feature upon admission and has been turned and repositioned side to side since. Every attempt is made to keep [facility] at or below a 30 degree angle for the ischial tuberosities. Necrotic and malodorous Unstageable sacral pressure injury requires surgical debridement or hydrotherapy administered by PT with conservative sharp wound debridement (CSWD).  If you agree, please order Surgical (CCS) consult to begin evaluative process. In the meantime, I have initiated twice daily sodium hypochlorite solution dressings (1/4 strength Dakin's) for 6 occurrences (3 days).  Thereafter and according to  policy, wound care to be with NS. Discussions pertaining to disposition should include consideration of a higher level of care than can be provided in the home, i.e., skilled facility.  Should this be a terminal ulcer or SCALE (Skin changes at life's end) ulcer, I suggest consideration of a  Goals of Care conference with family. WOC nursing team will not follow routinely, but will remain available to this patient, the nursing and medical teams.  Please re-consult if needed. Thanks, Ladona MowLaurie Cindie Rajagopalan, MSN, RN, GNP, Hans EdenCWOCN, CWON-AP, FAAN  Pager# [phone]

## 2016-08-02 NOTE — Progress Notes (Signed)
Prevlon boots placed on patient bilaterally, sacral dressing changed per instructions, and foam dressing intact and placed 08/02/16 with no need to change at this time

## 2016-08-02 NOTE — Progress Notes (Signed)
Pharmacy Antibiotic Note  Wendy RamsRuth K Kleinert is a 83 y.o. female with hx of GERD, frequent UTIs, arthritis, dementia, DM c/o cough, SOB and fever admitted on 08/01/2016 with sepsis.  Pharmacy has been consulted for zosyn/vancomycin dosing.  Plan: Zosyn 3.375g IV q8h (4 hour infusion).  Vancomycin 1 Gm x1 then 500 mg IV q24h  VT=15-20 mg/L F/u Scr/cultures/levels as needed  Height: 5\' 3"  (160 cm) Weight: 111 lb 1.8 oz (50.4 kg) IBW/kg (Calculated) : 52.4  Temp (24hrs), Avg:99.2 F (37.3 C), Min:97.8 F (36.6 C), Max:100.5 F (38.1 C)   Recent Labs Lab 08/01/16 2003 08/01/16 2039 08/01/16 2240  WBC  --  18.4*  --   CREATININE 1.05*  --   --   LATICACIDVEN  --  2.8* 2.2*    Estimated Creatinine Clearance: 28.3 mL/min (by C-G formula based on SCr of 1.05 mg/dL (H)).    Allergies  Allergen Reactions  . Ciprofloxacin Hives  . Sulfa Antibiotics Anaphylaxis    Antimicrobials this admission: 1/14 zosyn >>  1/14 vancomycin >>   Dose adjustments this admission:   Microbiology results:  BCx:   UCx:    Sputum:    MRSA PCR:   Thank you for allowing pharmacy to be a part of this patient's care.  Lorenza EvangelistGreen, Saba Gomm R 08/02/2016 1:01 AM

## 2016-08-03 DIAGNOSIS — E870 Hyperosmolality and hypernatremia: Secondary | ICD-10-CM

## 2016-08-03 DIAGNOSIS — E43 Unspecified severe protein-calorie malnutrition: Secondary | ICD-10-CM | POA: Diagnosis present

## 2016-08-03 LAB — BASIC METABOLIC PANEL
ANION GAP: 9 (ref 5–15)
ANION GAP: 8 (ref 5–15)
Anion gap: 6 (ref 5–15)
Anion gap: 8 (ref 5–15)
Anion gap: 8 (ref 5–15)
Anion gap: 10 (ref 5–15)
BUN: 55 mg/dL — ABNORMAL HIGH (ref 6–20)
BUN: 53 mg/dL — AB (ref 6–20)
BUN: 47 mg/dL — AB (ref 6–20)
BUN: 39 mg/dL — AB (ref 6–20)
BUN: 37 mg/dL — ABNORMAL HIGH (ref 6–20)
BUN: 37 mg/dL — ABNORMAL HIGH (ref 6–20)
CALCIUM: 9.4 mg/dL (ref 8.9–10.3)
CALCIUM: 9.4 mg/dL (ref 8.9–10.3)
CALCIUM: 8.4 mg/dL — AB (ref 8.9–10.3)
CALCIUM: 8.9 mg/dL (ref 8.9–10.3)
CALCIUM: 8.8 mg/dL — AB (ref 8.9–10.3)
CHLORIDE: 120 mmol/L — AB (ref 101–111)
CO2: 21 mmol/L — AB (ref 22–32)
CO2: 23 mmol/L (ref 22–32)
CO2: 20 mmol/L — ABNORMAL LOW (ref 22–32)
CO2: 16 mmol/L — AB (ref 22–32)
CO2: 18 mmol/L — AB (ref 22–32)
CO2: 18 mmol/L — AB (ref 22–32)
CREATININE: 0.95 mg/dL (ref 0.44–1.00)
CREATININE: 0.86 mg/dL (ref 0.44–1.00)
CREATININE: 0.96 mg/dL (ref 0.44–1.00)
Calcium: 9 mg/dL (ref 8.9–10.3)
Chloride: 124 mmol/L — ABNORMAL HIGH (ref 101–111)
Chloride: 122 mmol/L — ABNORMAL HIGH (ref 101–111)
Chloride: 121 mmol/L — ABNORMAL HIGH (ref 101–111)
Chloride: 119 mmol/L — ABNORMAL HIGH (ref 101–111)
Chloride: 120 mmol/L — ABNORMAL HIGH (ref 101–111)
Creatinine, Ser: 0.89 mg/dL (ref 0.44–1.00)
Creatinine, Ser: 0.85 mg/dL (ref 0.44–1.00)
Creatinine, Ser: 0.96 mg/dL (ref 0.44–1.00)
GFR calc Af Amer: 59 mL/min — ABNORMAL LOW (ref >60)
GFR calc Af Amer: >60 mL/min (ref >60)
GFR calc Af Amer: >60 mL/min (ref >60)
GFR calc Af Amer: >60 mL/min (ref >60)
GFR calc Af Amer: 59 mL/min — ABNORMAL LOW (ref >60)
GFR calc Af Amer: 59 mL/min — ABNORMAL LOW (ref >60)
GFR calc non Af Amer: 51 mL/min — ABNORMAL LOW (ref >60)
GFR calc non Af Amer: 55 mL/min — ABNORMAL LOW (ref >60)
GFR calc non Af Amer: 59 mL/min — ABNORMAL LOW (ref >60)
GFR calc non Af Amer: 58 mL/min — ABNORMAL LOW (ref >60)
GFR calc non Af Amer: 51 mL/min — ABNORMAL LOW (ref >60)
GFR calc non Af Amer: 51 mL/min — ABNORMAL LOW (ref >60)
GLUCOSE: 185 mg/dL — AB (ref 65–99)
GLUCOSE: 163 mg/dL — AB (ref 65–99)
GLUCOSE: 222 mg/dL — AB (ref 65–99)
GLUCOSE: 160 mg/dL — AB (ref 65–99)
GLUCOSE: 159 mg/dL — AB (ref 65–99)
Glucose, Bld: 155 mg/dL — ABNORMAL HIGH (ref 65–99)
POTASSIUM: 3.6 mmol/L (ref 3.5–5.1)
Potassium: 4.4 mmol/L (ref 3.5–5.1)
Potassium: 3.7 mmol/L (ref 3.5–5.1)
Potassium: 3.4 mmol/L — ABNORMAL LOW (ref 3.5–5.1)
Potassium: 3 mmol/L — ABNORMAL LOW (ref 3.5–5.1)
Potassium: 2.9 mmol/L — ABNORMAL LOW (ref 3.5–5.1)
SODIUM: 148 mmol/L — AB (ref 135–145)
Sodium: 151 mmol/L — ABNORMAL HIGH (ref 135–145)
Sodium: 153 mmol/L — ABNORMAL HIGH (ref 135–145)
Sodium: 147 mmol/L — ABNORMAL HIGH (ref 135–145)
Sodium: 146 mmol/L — ABNORMAL HIGH (ref 135–145)
Sodium: 146 mmol/L — ABNORMAL HIGH (ref 135–145)

## 2016-08-03 LAB — GLUCOSE, CAPILLARY
GLUCOSE-CAPILLARY: 150 mg/dL — AB (ref 65–99)
GLUCOSE-CAPILLARY: 152 mg/dL — AB (ref 65–99)
GLUCOSE-CAPILLARY: 168 mg/dL — AB (ref 65–99)
Glucose-Capillary: 119 mg/dL — ABNORMAL HIGH (ref 65–99)
Glucose-Capillary: 128 mg/dL — ABNORMAL HIGH (ref 65–99)
Glucose-Capillary: 142 mg/dL — ABNORMAL HIGH (ref 65–99)
Glucose-Capillary: 151 mg/dL — ABNORMAL HIGH (ref 65–99)
Glucose-Capillary: 42 mg/dL — CL (ref 65–99)

## 2016-08-03 LAB — URINE CULTURE: CULTURE: NO GROWTH

## 2016-08-03 LAB — HEMOGLOBIN A1C
Hgb A1c MFr Bld: 6 % — ABNORMAL HIGH (ref 4.8–5.6)
Mean Plasma Glucose: 126 mg/dL

## 2016-08-03 MED ORDER — DEXTROSE 50 % IV SOLN
INTRAVENOUS | Status: AC
  Administered 2016-08-03: 50 mL
  Filled 2016-08-03: qty 50

## 2016-08-03 MED ORDER — POTASSIUM CL IN DEXTROSE 5% 20 MEQ/L IV SOLN
20.0000 meq | INTRAVENOUS | Status: DC
  Administered 2016-08-03: 20 meq via INTRAVENOUS
  Filled 2016-08-03 (×2): qty 1000

## 2016-08-03 MED ORDER — ADULT MULTIVITAMIN W/MINERALS CH
1.0000 | ORAL_TABLET | Freq: Every day | ORAL | Status: DC
  Administered 2016-08-03: 1 via ORAL
  Filled 2016-08-03 (×2): qty 1

## 2016-08-03 NOTE — Progress Notes (Signed)
PROGRESS NOTE    Wendy Vasquez  ZOX:[id number] DOB: [birth year] DOA: 08/01/2016 PCP: Pamelia Hoit, MD    Brief Narrative:  Wendy Vasquez is a 83 y.o. female with medical history significant of  GERD, frequent UTIs, arthritis, dementia, DM 2 diet controlled    Presented with productive cough since this a.m.  Patient has known history of sacral decubitus which at first was on a quarter size but has progressed to baseball size. Patient lives at home with her daughter. At her baseline patient is nonverbal and nonambulatory. She is only able to make eye contact patient herself unable to provide any history. Reports patient had history of pneumonia last year that she required hospitalization. She has recently started on Keflex because she had strong smelling urine. Patient's son in law has been diagnosed with flu 2 days ago. On EMS arrival blood sugar elevated 340 Regarding pertinent Chronic problems: Patient has mild asthma and diabetes which is diet-controlled patient has remote history of gastric ulcers requiring blood transfusion.  Patient started on broad spectrum antibiotics and was found to be hypernatremic- likely from volume depletion and sodium wasting.  She was given IVF to help bring down sodium level.  Patient was seen by SLP who found severe cognitive deficits severe oropharyngeal dysphagia of cognitive etiology. Pt with advanced dementia, unable to follow commands and is nonverbal. Full feeding assistance required throughout PO trials. Patient was found to have a sacral decubitus ulcer that was assessed by WOC and found to be Necrotic and malodorous Unstageable sacral pressure injury requiring surgical debridement or hydrotherapy administered by PT with conservative sharp wound debridement (CSWD).  Wound care states that if MD agrees order Surgical ([facility]) consult to begin evaluative process. Discussed with patient's daughter who voices she would like surgery to consult and see what options  are.  Discussed long term goals of care and that patient is significantly demented, has numerous wounds and is malnourished and cachectic with a significant risk of aspiration.  Daughter agreed to palliative care consult.   Assessment & Plan:   Active Problems:   HTN (hypertension)   Asthma   Sepsis (HCC)   A-fib (HCC)   Decubitus ulcer   Goals of care - discussed illness of patient and disease progression with daughter - daughter would like to continue antibiotics and carefully decrease sodium - daughter understands patient has been declining - palliative care consulted as patient has significant risk of recurrence of infection from sacral ulcer and poses a significant aspiration risk   Sepsis (HCC) - treat with IV fluids - blood cultures showing NG x 1 d - Influenza PCR negative - respiratory panel negative - continue IVF- changed to both NS and D5W to replace water and sodium losses  Hypernatremia - BMP q4h - improved to 148 currently (down from 159) - continue to follow - continue NS and D5W at lesser rates  Decubitus ulcer  - wound care consulted and significant pressure injury appreciated - continue antibiotics of zosyn and vancomycin - gen surgery consulted- appreciate their input - numerous other ulcers noted - location, debility and nutritional status make healing unlikely  A-fib (HCC)  - patient not a candidate for anticoagulation given advanced age and multiple comorbidities including history of GI bleed per family she has been in sinus rhythm for years  HTN (hypertension) - hold by mouth medications for now and observe - blood pressures seem reasonably controlled  Deconditioning patient has not walked in months - will need home health  and can be seen by PT/OT but unclear how mobile patient is or if she can follow direction  Asthma mild  - administer albuterol as needed   Diabetes mellitus  - order sliding scale  - hemoglobin A1c of 6.0     DVT  prophylaxis:    Lovenox    Code Status:    DNR/DNI   as per  family  Family Communication:   Daughter Tammy is bedside Disposition Plan: guarded at this time- patient has many medical problems and has not been eating and appears significantly malnourished.  Appreciate palliative input- may be appropriate for hospice.   Consultants:   General Surgery  Palliative Care  Procedures:   None  Antimicrobials:   Zosyn  Vancomycin    Subjective: Patient nonverbal.  Eyes are somewhat open but she does not track.  She does not make any communicative gestures.   Objective: Vitals:   08/02/16 0434 08/02/16 1602 08/02/16 2003 08/03/16 0408  BP: 112/67 117/68 106/87 (!) 112/48  Pulse: 87 95 91 69  Resp: 20 20 20 20   Temp: 97.5 F (36.4 C) 97.9 F (36.6 C) 98.2 F (36.8 C) 98.5 F (36.9 C)  TempSrc: Oral Axillary Oral Axillary  SpO2: 97% 100% 100% 100%  Weight:      Height:        Intake/Output Summary (Last 24 hours) at 08/03/16 1310 Last data filed at 08/03/16 0600  Gross per 24 hour  Intake          3010.42 ml  Output                0 ml  Net          3010.42 ml   Filed Weights   08/02/16 0025  Weight: 50.4 kg (111 lb 1.8 oz)    Examination:  General exam: Appears calm and comfortable  Respiratory system: Clear to auscultation. Respiratory effort normal. Cardiovascular system: S1 & S2 heard, RRR. No JVD, murmurs, rubs, gallops or clicks. No pedal edema. Gastrointestinal system: Abdomen is nondistended, soft and nontender. No organomegaly or masses felt. Normal bowel sounds heard. Central nervous system: unclear if patient is alert; unable to assess CN Extremities: patient did not move arms or legs during exam Skin: 9cm x 10cm sacral ulcer with black escharge and small 1cm opening Psychiatry: unable to assess.     Data Reviewed: I have personally reviewed following labs and imaging studies  CBC:  Recent Labs Lab 08/01/16 2039 08/02/16 0242  WBC 18.4*  15.8*  NEUTROABS 15.1* 12.7*  HGB 14.7 12.7  HCT 45.5 41.4  MCV 97.8 101.5*  PLT 256 207   Basic Metabolic Panel:  Recent Labs Lab 08/02/16 0242  08/02/16 1904 08/02/16 2158 08/03/16 0104 08/03/16 0538 08/03/16 1059  NA 156*  < > 156* 154* 151* 153* 148*  K 4.0  < > 3.9 3.6 4.4 3.7 3.6  CL 126*  < > 125* 124* 124* 122* 120*  CO2 22  < > 22 20* 21* 23 20*  GLUCOSE 222*  < > 205* 208* 185* 163* 155*  BUN 74*  < > 60* 59* 55* 53* 47*  CREATININE 0.99  < > 0.98 1.02* 0.95 0.89 0.85  CALCIUM 10.2  < > 9.7 9.6 9.4 9.4 9.0  MG 2.6*  --   --   --   --   --   --   PHOS 2.1*  --   --   --   --   --   --   < > =  values in this interval not displayed. GFR: Estimated Creatinine Clearance: 35 mL/min (by C-G formula based on SCr of 0.85 mg/dL). Liver Function Tests:  Recent Labs Lab 08/02/16 0242  AST 32  ALT 32  ALKPHOS 83  BILITOT 0.8  PROT 6.7  ALBUMIN 2.9*   No results for input(s): LIPASE, AMYLASE in the last 168 hours. No results for input(s): AMMONIA in the last 168 hours. Coagulation Profile: No results for input(s): INR, PROTIME in the last 168 hours. Cardiac Enzymes: No results for input(s): CKTOTAL, CKMB, CKMBINDEX, TROPONINI in the last 168 hours. BNP (last 3 results) No results for input(s): PROBNP in the last 8760 hours. HbA1C:  Recent Labs  08/02/16 0242  HGBA1C 6.0*   CBG:  Recent Labs Lab 08/02/16 2000 08/03/16 0003 08/03/16 0406 08/03/16 0801 08/03/16 1204  GLUCAP 174* 168* 128* 142* 119*   Lipid Profile: No results for input(s): CHOL, HDL, LDLCALC, TRIG, CHOLHDL, LDLDIRECT in the last 72 hours. Thyroid Function Tests:  Recent Labs  08/02/16 0242  TSH 0.911   Anemia Panel: No results for input(s): VITAMINB12, FOLATE, FERRITIN, TIBC, IRON, RETICCTPCT in the last 72 hours. Sepsis Labs:  Recent Labs Lab 08/01/16 2039 08/01/16 2240 08/02/16 0242 08/02/16 0249 08/02/16 0557  PROCALCITON  --   --  0.16  --   --   LATICACIDVEN 2.8*  2.2*  --  2.7* 2.0*    Recent Results (from the past 240 hour(s))  Culture, blood (Routine X 2) w Reflex to ID Panel     Status: None (Preliminary result)   Collection Time: 08/01/16  8:40 PM  Result Value Ref Range Status   Specimen Description BLOOD RIGHT FOREARM  Final   Special Requests BOTTLES DRAWN AEROBIC AND ANAEROBIC  Final   Culture   Final    NO GROWTH 1 DAY Performed at [facility]    Report Status PENDING  Incomplete  Culture, blood (Routine X 2) w Reflex to ID Panel     Status: None (Preliminary result)   Collection Time: 08/01/16  8:40 PM  Result Value Ref Range Status   Specimen Description BLOOD RIGHT HAND  Final   Special Requests BOTTLES DRAWN AEROBIC AND ANAEROBIC  Final   Culture   Final    NO GROWTH 1 DAY Performed at [facility]    Report Status PENDING  Incomplete  Respiratory Panel by PCR     Status: None   Collection Time: 08/02/16 12:57 AM  Result Value Ref Range Status   Adenovirus NOT DETECTED NOT DETECTED Final   Coronavirus 229E NOT DETECTED NOT DETECTED Final   Coronavirus HKU1 NOT DETECTED NOT DETECTED Final   Coronavirus NL63 NOT DETECTED NOT DETECTED Final   Coronavirus OC43 NOT DETECTED NOT DETECTED Final   Metapneumovirus NOT DETECTED NOT DETECTED Final   Rhinovirus / Enterovirus NOT DETECTED NOT DETECTED Final   Influenza A NOT DETECTED NOT DETECTED Final   Influenza B NOT DETECTED NOT DETECTED Final   Parainfluenza Virus 1 NOT DETECTED NOT DETECTED Final   Parainfluenza Virus 2 NOT DETECTED NOT DETECTED Final   Parainfluenza Virus 3 NOT DETECTED NOT DETECTED Final   Parainfluenza Virus 4 NOT DETECTED NOT DETECTED Final   Respiratory Syncytial Virus NOT DETECTED NOT DETECTED Final   Bordetella pertussis NOT DETECTED NOT DETECTED Final   Chlamydophila pneumoniae NOT DETECTED NOT DETECTED Final   Mycoplasma pneumoniae NOT DETECTED NOT DETECTED Final    Comment: Performed at [facility]  MRSA  PCR Screening      Status: None   Collection Time: 08/02/16  1:30 AM  Result Value Ref Range Status   MRSA by PCR NEGATIVE NEGATIVE Final    Comment:        The GeneXpert MRSA Assay (FDA approved for NASAL specimens only), is one component of a comprehensive MRSA colonization surveillance program. It is not intended to diagnose MRSA infection nor to guide or monitor treatment for MRSA infections.   Urine culture     Status: None   Collection Time: 08/02/16  1:59 AM  Result Value Ref Range Status   Specimen Description URINE, CLEAN CATCH  Final   Special Requests NONE  Final   Culture NO GROWTH Performed at [facility]   Final   Report Status 08/03/2016 FINAL  Final         Radiology Studies: Dg Chest 2 View  Result Date: 08/01/2016 CLINICAL DATA:  Pt BIB EMS. She is from home. Her daughter reports she has had a productive cough since this morning. She has a hx of cognitive disorder. EMS reports she is being treated for a sacral and decubitus ulcer. She has hx of DM. Blood glucose 340 per EMS. She is not on any DM medication. She is a full code. No acute distress. Ex-smoker, asthma, diabetic, htn EXAM: CHEST  2 VIEW COMPARISON:  07/02/2016 FINDINGS: Cardiac silhouette is normal in size. No mediastinal or hilar masses or convincing adenopathy. Clear lungs.  No pleural effusion or pneumothorax. IMPRESSION: No acute cardiopulmonary disease. Electronically Signed   By: Amie Portland M.D.   On: 08/01/2016 19:48        Scheduled Meds: . aspirin EC  81 mg Oral Daily  . chlorhexidine  15 mL Mouth Rinse BID  . enoxaparin (LOVENOX) injection  30 mg Subcutaneous Q24H  . guaiFENesin  600 mg Oral BID  . insulin aspart  0-9 Units Subcutaneous Q4H  . mouth rinse  15 mL Mouth Rinse q12n4p  . mometasone-formoterol  2 puff Inhalation BID  . piperacillin-tazobactam (ZOSYN)  IV  3.375 g Intravenous Q8H  . sodium chloride flush  3 mL Intravenous Q12H  . sodium hypochlorite   Irrigation BID  .  vancomycin  500 mg Intravenous Q24H   Continuous Infusions: . sodium chloride 75 mL/hr at 08/03/16 1156  . dextrose 125 mL/hr at 08/03/16 0155     LOS: 2 days    Time spent: 35 minutes    Wendy Blazing, MD [facility] Pager [phone]  If 7PM-7AM, please contact night-coverage [email or web address] Password [email or web address] 08/03/2016, 1:10 PM

## 2016-08-03 NOTE — Progress Notes (Signed)
Palliative Medicine consult noted. Due to high referral volume, there may be a delay seeing this patient. Please call the Palliative Medicine Team office at [phone] if recommendations are needed in the interim.  Thank you for inviting us to see this patient.  Margret ChanceMelanie G. Nelle Sayed, RN, BSN, [facility] 08/03/2016 12:12 PM Cell [phone] 8:00-4:00 Monday-Friday Office [phone]

## 2016-08-03 NOTE — Progress Notes (Signed)
Initial Nutrition Assessment  DOCUMENTATION CODES:   Severe malnutrition in context of chronic illness  INTERVENTION:  - Will order Magic cup TID with meals, each supplement provides 290 kcal and 9 grams of protein. - Will order daily multivitamin. - Continue to assist pt with meals if daughter is not present and encourage PO intakes.  - RD will continue to monitor for additional nutrition-related needs as it relates to GOC.  NUTRITION DIAGNOSIS:   Malnutrition related to chronic illness as evidenced by severe depletion of body fat, severe depletion of muscle mass.  GOAL:   Patient will meet greater than or equal to 90% of their needs  MONITOR:   PO intake, Supplement acceptance, Weight trends, Labs, Skin, I & O's  REASON FOR ASSESSMENT:   Consult  (malnutrition)  ASSESSMENT:   83 y.o. female with medical history significant of  GERD, frequent UTIs, arthritis, dementia, DM 2 diet controlled. She presented with productive cough since this a.m. Patient has known history of sacral decubitus which at first was on a quarter size but has progressed to baseball size. At her baseline patient is nonverbal and non-ambulatory. She has recently started on Keflex because she had strong smelling urine. Patient's son in law has been diagnosed with flu 2 days ago.  BMI indicates normal weight. Per chart review, pt consumed 0% of dinner last night. No family/visitors present at this time and pt is nonverbal (she was mainly sleeping throughout the time of visit). Spoke with RN who reports pt's daughter gave pt ~4 tablespoons of apple juice and applesauce earlier today; no other PO intakes today. Will order Magic Cup to supplement considering need for thickened liquids. Palliative Care consult in place; will monitor for POC/GOC based on this meeting.   Physical assessment shows moderate and severe muscle and severe fat wasting; unable to assess for edema to BLE d/t bilateral boots. Per chart review,  pt has lost 19 lbs (14.6% body weight) in the past 1 month. Unable to confirm this weight loss. Prior to December 2017, no weight since November 2015.   Medications reviewed; sliding scale Novolog, PRN Zofran.  Labs reviewed; CBGs: 119-168 mg/dL today, Na: 629148 mmol/L, Cl: 120 mmol/L, BUN: 47 mg/dL.  IVF: NS @ 50 mL/hr and D5 @ 75 mL/hr (306 kcal).    Diet Order:  DIET - DYS 1 Room service appropriate? Yes; Fluid consistency: Honey Thick  Skin:   Unstageable full-thickness sacral pressure injury, Stage 1 pressure injuries to R shoulder and 3 areas on back, DTI to bilateral buttocks.  Last BM:  1/15  Height:   Ht Readings from Last 1 Encounters:  08/02/16 5\' 3"  (1.6 m)    Weight:   Wt Readings from Last 1 Encounters:  08/02/16 111 lb 1.8 oz (50.4 kg)    Ideal Body Weight:  52.27 kg  BMI:  Body mass index is 19.68 kg/m.  Estimated Nutritional Needs:   Kcal:  1210-1410 (24-28 kcal/kg)  Protein:  60-70 grams (1.2-1.4 grams/kg)  Fluid:  >/= 1.5 L/day  EDUCATION NEEDS:   No education needs identified at this time    Trenton GammonJessica Akeiba Axelson, MS, RD, LDN, CNSC Inpatient Clinical Dietitian Pager # [phone] After hours/weekend pager # [phone]

## 2016-08-03 NOTE — Consult Note (Signed)
Reason for Consult:  Decubitus ulcer Referring Physician:  Dr. Adair Patter, A PCP; Woody Seller, MD   Wendy Vasquez is an 83 y.o. female.  HPI: Pt presented to the ED with cough that is reported new starting day prior to admit.  She has a hx of cognitive disorder and is non verbal. EMS recorded a glucose of 340.   She was admitted for possible sepsis, decubitus care, Afib, - not an anticoagulation candidate, severed deconditioning.  She was hypernatremic 156, chloride 126,  with a glucose of 222, lactate 2.0, WBC 15.8, probable UTI.  No EKG yesterday her last one 04/24/14 showed age fibrillation with rapid ventricular response. CXR shows the lung are clear. Wound care has an extensive note with measurements.  Sacral is:  Unstageable Pressure Injury measures 9cm x 10cm: black firmly adherent eschar with small opening at apex measuring 1cm x 0.8cm. Malodorous; odor is consistent with necrotic tissue. Mid-thoracic deep tissue pressure injury (DTPI):  3cm x 0.4cm with no depth. Purple/maroon discoloration of tissue.  Bilateral ischial tuberosity deep tissue pressure injuries: 2cm x 3cm with peeling epidermis, <0.1cm depth.  Purple/maroon discoloration of skin.  We are ask to see. Past Medical History:  Diagnosis Date  . Arthritis    "all over"  . Asthma    "a touch"  . Bleeding ulcer    requiring transfusions  . Bleeding ulcer   . Daily headache    "last couple days" (04/24/2014)  . Diabetes mellitus without complication ([place])    "diet controlled"  . GERD (gastroesophageal reflux disease)   . H/O hiatal hernia   . History of blood transfusion    "related to bleeding ulcers"  . HLD (hyperlipidemia)   . Hypertension   . Migraine    hx  . Pneumonia X 1    Past Surgical History:  Procedure Laterality Date  . CATARACT EXTRACTION  "years ago"   "don't know which side; don't know if they put lens in"  . DILATION AND CURETTAGE OF UTERUS     "after she lost a child, I'd assume"  . JOINT  REPLACEMENT    . TOTAL KNEE ARTHROPLASTY Bilateral     Family History  Problem Relation Age of Onset  . Colon cancer Mother     Social History:  reports that she has quit smoking. Her smoking use included Cigarettes. She has never used smokeless tobacco. She reports that she does not drink alcohol or use drugs.  Allergies:  Allergies  Allergen Reactions  . Ciprofloxacin Hives  . Sulfa Antibiotics Anaphylaxis    Medications:  Prior to Admission:  Prescriptions Prior to Admission  Medication Sig Dispense Refill Last Dose  . acetaminophen (TYLENOL) 500 MG tablet Take 1,000 mg by mouth 2 (two) times daily as needed for mild pain or headache.    08/01/2016 at Unknown time  . acidophilus (RISAQUAD) CAPS capsule Take 1 capsule by mouth daily.   07/31/2016 at Unknown time  . aspirin EC 81 MG tablet Take 81 mg by mouth daily.   08/01/2016 at Unknown time  . calcium-vitamin D (CALCIUM 500/D) 500-200 MG-UNIT tablet Take 1 tablet by mouth 2 (two) times daily.   08/01/2016 at Unknown time  . cephALEXin (KEFLEX) 500 MG capsule Take 500 mg by mouth 2 (two) times daily. Started 1/10 x 10 days   08/01/2016 at Unknown time  . cholecalciferol (VITAMIN D) 1000 units tablet Take 1,000 Units by mouth daily.   08/01/2016 at Unknown time  . dextromethorphan (DELSYM)  30 MG/5ML liquid Take 60 mg by mouth as needed for cough.   08/01/2016 at Unknown time  . docusate sodium (COLACE) 100 MG capsule Take 200 mg by mouth at bedtime as needed for mild constipation.    07/31/2016 at Unknown time  . esomeprazole (NEXIUM) 40 MG capsule Take 40 mg by mouth at bedtime.    08/01/2016 at Unknown time  . fluticasone (FLONASE) 50 MCG/ACT nasal spray Place 1 spray into both nostrils daily.   08/01/2016 at Unknown time  . Fluticasone-Salmeterol (ADVAIR) 250-50 MCG/DOSE AEPB Inhale 1 puff into the lungs 2 (two) times daily.   08/01/2016 at Unknown time  . LORazepam (ATIVAN) 1 MG tablet Take 0.5-1 mg by mouth 2 (two) times daily as needed  for anxiety.   08/01/2016 at Unknown time  . Multiple Vitamin (MULTIVITAMIN WITH MINERALS) TABS tablet Take 1 tablet by mouth daily.   08/01/2016 at Unknown time  . nystatin cream (MYCOSTATIN) Apply 1 application topically 2 (two) times daily as needed (for itching/irritation).   Past Week at Unknown time  . polyethylene glycol (MIRALAX / GLYCOLAX) packet Take 17 g by mouth daily as needed for mild constipation.   Past Week at Unknown time  . tetrahydrozoline (VISINE) 0.05 % ophthalmic solution Place 1 drop into both eyes as needed (for dry eyes).    unknown  . vitamin B-12 (CYANOCOBALAMIN) 1000 MCG tablet Take 1,000 mcg by mouth daily.    08/01/2016 at Unknown time  . dextromethorphan-guaiFENesin (TUSSIN DM) 10-100 MG/5ML liquid Take 5 mLs by mouth every 4 (four) hours as needed for cough. (Patient not taking: Reported on 08/01/2016) 118 mL 0 Completed Course at Unknown time   Scheduled: . aspirin EC  81 mg Oral Daily  . chlorhexidine  15 mL Mouth Rinse BID  . enoxaparin (LOVENOX) injection  30 mg Subcutaneous Q24H  . guaiFENesin  600 mg Oral BID  . insulin aspart  0-9 Units Subcutaneous Q4H  . mouth rinse  15 mL Mouth Rinse q12n4p  . mometasone-formoterol  2 puff Inhalation BID  . piperacillin-tazobactam (ZOSYN)  IV  3.375 g Intravenous Q8H  . sodium chloride flush  3 mL Intravenous Q12H  . sodium hypochlorite   Irrigation BID  . vancomycin  500 mg Intravenous Q24H   Continuous: . sodium chloride 75 mL/hr at 08/03/16 1156  . dextrose 125 mL/hr at 08/03/16 0155   SAY:[id number] **OR** acetaminophen, albuterol, HYDROcodone-acetaminophen, LORazepam, ondansetron **OR** ondansetron (ZOFRAN) IV Anti-infectives    Start     Dose/Rate Route Frequency Ordered Stop   08/03/16 0600  vancomycin (VANCOCIN) 500 mg in sodium chloride 0.9 % 100 mL IVPB     500 mg 100 mL/hr over 60 Minutes Intravenous Every 24 hours 08/02/16 0232     08/02/16 1000  piperacillin-tazobactam (ZOSYN) IVPB 3.375 g      3.375 g 12.5 mL/hr over 240 Minutes Intravenous Every 8 hours 08/02/16 0232     08/02/16 0130  vancomycin (VANCOCIN) IVPB 1000 mg/200 mL premix     1,000 mg 200 mL/hr over 60 Minutes Intravenous  Once 08/02/16 0055 08/02/16 0233   08/02/16 0100  piperacillin-tazobactam (ZOSYN) IVPB 3.375 g     3.375 g 100 mL/hr over 30 Minutes Intravenous  Once 08/02/16 0055 08/02/16 0203      Results for orders placed or performed during the hospital encounter of 08/01/16 (from the past 48 hour(s))  Basic metabolic panel     Status: Abnormal   Collection Time: 08/01/16  8:03 PM  Result Value Ref Range   Sodium 154 (H) 135 - 145 mmol/L   Potassium 4.5 3.5 - 5.1 mmol/L   Chloride 126 (H) 101 - 111 mmol/L   CO2 17 (L) 22 - 32 mmol/L   Glucose, Bld 221 (H) 65 - 99 mg/dL   BUN 85 (H) 6 - 20 mg/dL   Creatinine, Ser 1.05 (H) 0.44 - 1.00 mg/dL   Calcium 10.8 (H) 8.9 - 10.3 mg/dL   GFR calc non Af Amer 45 (L) >60 mL/min   GFR calc Af Amer 53 (L) >60 mL/min    Comment: (NOTE) The eGFR has been calculated using the CKD EPI equation. This calculation has not been validated in all clinical situations. eGFR's persistently <60 mL/min signify possible Chronic Kidney Disease.    Anion gap 11 5 - 15  CBC with Differential/Platelet     Status: Abnormal   Collection Time: 08/01/16  8:39 PM  Result Value Ref Range   WBC 18.4 (H) 4.0 - 10.5 K/uL   RBC 4.65 3.87 - 5.11 MIL/uL   Hemoglobin 14.7 12.0 - 15.0 g/dL   HCT 45.5 36.0 - 46.0 %   MCV 97.8 78.0 - 100.0 fL   MCH 31.6 26.0 - 34.0 pg   MCHC 32.3 30.0 - 36.0 g/dL   RDW 15.0 11.5 - 15.5 %   Platelets 256 150 - 400 K/uL   Neutrophils Relative % 82 %   Neutro Abs 15.1 (H) 1.7 - 7.7 K/uL   Lymphocytes Relative 10 %   Lymphs Abs 1.8 0.7 - 4.0 K/uL   Monocytes Relative 8 %   Monocytes Absolute 1.5 (H) 0.1 - 1.0 K/uL   Eosinophils Relative 0 %   Eosinophils Absolute 0.0 0.0 - 0.7 K/uL   Basophils Relative 0 %   Basophils Absolute 0.0 0.0 - 0.1 K/uL  Lactic  acid, plasma     Status: Abnormal   Collection Time: 08/01/16  8:39 PM  Result Value Ref Range   Lactic Acid, Venous 2.8 (HH) 0.5 - 1.9 mmol/L    Comment: CRITICAL RESULT CALLED TO, READ BACK BY AND VERIFIED WITH: Joanne Chars [id number] @ 2115 BY J SCOTTON   Influenza panel by PCR (type A & B, H1N1)     Status: None   Collection Time: 08/01/16  9:47 PM  Result Value Ref Range   Influenza A By PCR NEGATIVE NEGATIVE   Influenza B By PCR NEGATIVE NEGATIVE    Comment: (NOTE) The Xpert Xpress Flu assay is intended as an aid in the diagnosis of  influenza and should not be used as a sole basis for treatment.  This  assay is FDA approved for nasopharyngeal swab specimens only. Nasal  washings and aspirates are unacceptable for Xpert Xpress Flu testing.   Lactic acid, plasma     Status: Abnormal   Collection Time: 08/01/16 10:40 PM  Result Value Ref Range   Lactic Acid, Venous 2.2 (HH) 0.5 - 1.9 mmol/L    Comment: CRITICAL RESULT CALLED TO, READ BACK BY AND VERIFIED WITH: Joanne Chars [id number] @ [id number] BY J SCOTTON   Respiratory Panel by PCR     Status: None   Collection Time: 08/02/16 12:57 AM  Result Value Ref Range   Adenovirus NOT DETECTED NOT DETECTED   Coronavirus 229E NOT DETECTED NOT DETECTED   Coronavirus HKU1 NOT DETECTED NOT DETECTED   Coronavirus NL63 NOT DETECTED NOT DETECTED   Coronavirus OC43 NOT DETECTED NOT DETECTED   Metapneumovirus NOT DETECTED NOT DETECTED  Rhinovirus / Enterovirus NOT DETECTED NOT DETECTED   Influenza A NOT DETECTED NOT DETECTED   Influenza B NOT DETECTED NOT DETECTED   Parainfluenza Virus 1 NOT DETECTED NOT DETECTED   Parainfluenza Virus 2 NOT DETECTED NOT DETECTED   Parainfluenza Virus 3 NOT DETECTED NOT DETECTED   Parainfluenza Virus 4 NOT DETECTED NOT DETECTED   Respiratory Syncytial Virus NOT DETECTED NOT DETECTED   Bordetella pertussis NOT DETECTED NOT DETECTED   Chlamydophila pneumoniae NOT DETECTED NOT DETECTED   Mycoplasma pneumoniae  NOT DETECTED NOT DETECTED    Comment: Performed at [facility]  Glucose, capillary     Status: Abnormal   Collection Time: 08/02/16  1:25 AM  Result Value Ref Range   Glucose-Capillary 152 (H) 65 - 99 mg/dL  MRSA PCR Screening     Status: None   Collection Time: 08/02/16  1:30 AM  Result Value Ref Range   MRSA by PCR NEGATIVE NEGATIVE    Comment:        The GeneXpert MRSA Assay (FDA approved for NASAL specimens only), is one component of a comprehensive MRSA colonization surveillance program. It is not intended to diagnose MRSA infection nor to guide or monitor treatment for MRSA infections.   Urinalysis, Routine w reflex microscopic     Status: Abnormal   Collection Time: 08/02/16  1:59 AM  Result Value Ref Range   Color, Urine YELLOW YELLOW   APPearance HAZY (A) CLEAR   Specific Gravity, Urine 1.018 1.005 - 1.030   pH 5.0 5.0 - 8.0   Glucose, UA NEGATIVE NEGATIVE mg/dL   Hgb urine dipstick MODERATE (A) NEGATIVE   Bilirubin Urine NEGATIVE NEGATIVE   Ketones, ur NEGATIVE NEGATIVE mg/dL   Protein, ur 100 (A) NEGATIVE mg/dL   Nitrite NEGATIVE NEGATIVE   Leukocytes, UA LARGE (A) NEGATIVE   RBC / HPF TOO NUMEROUS TO COUNT 0 - 5 RBC/hpf   WBC, UA TOO NUMEROUS TO COUNT 0 - 5 WBC/hpf   Bacteria, UA RARE (A) NONE SEEN   Squamous Epithelial / LPF 0-5 (A) NONE SEEN   Mucous PRESENT   Urine culture     Status: None   Collection Time: 08/02/16  1:59 AM  Result Value Ref Range   Specimen Description URINE, CLEAN CATCH    Special Requests NONE    Culture NO GROWTH Performed at [facility]     Report Status 08/03/2016 FINAL   CBC with Differential     Status: Abnormal   Collection Time: 08/02/16  2:42 AM  Result Value Ref Range   WBC 15.8 (H) 4.0 - 10.5 K/uL   RBC 4.08 3.87 - 5.11 MIL/uL   Hemoglobin 12.7 12.0 - 15.0 g/dL   HCT 41.4 36.0 - 46.0 %   MCV 101.5 (H) 78.0 - 100.0 fL   MCH 31.1 26.0 - 34.0 pg   MCHC 30.7 30.0 - 36.0 g/dL   RDW 15.5 11.5 - 15.5 %    Platelets 207 150 - 400 K/uL   Neutrophils Relative % 81 %   Neutro Abs 12.7 (H) 1.7 - 7.7 K/uL   Lymphocytes Relative 12 %   Lymphs Abs 1.9 0.7 - 4.0 K/uL   Monocytes Relative 7 %   Monocytes Absolute 1.2 (H) 0.1 - 1.0 K/uL   Eosinophils Relative 0 %   Eosinophils Absolute 0.0 0.0 - 0.7 K/uL   Basophils Relative 0 %   Basophils Absolute 0.0 0.0 - 0.1 K/uL  Comprehensive metabolic panel  Status: Abnormal   Collection Time: 08/02/16  2:42 AM  Result Value Ref Range   Sodium 156 (H) 135 - 145 mmol/L   Potassium 4.0 3.5 - 5.1 mmol/L   Chloride 126 (H) 101 - 111 mmol/L   CO2 22 22 - 32 mmol/L   Glucose, Bld 222 (H) 65 - 99 mg/dL   BUN 74 (H) 6 - 20 mg/dL   Creatinine, Ser 0.99 0.44 - 1.00 mg/dL   Calcium 10.2 8.9 - 10.3 mg/dL   Total Protein 6.7 6.5 - 8.1 g/dL   Albumin 2.9 (L) 3.5 - 5.0 g/dL   AST 32 15 - 41 U/L   ALT 32 14 - 54 U/L   Alkaline Phosphatase 83 38 - 126 U/L   Total Bilirubin 0.8 0.3 - 1.2 mg/dL   GFR calc non Af Amer 49 (L) >60 mL/min   GFR calc Af Amer 56 (L) >60 mL/min    Comment: (NOTE) The eGFR has been calculated using the CKD EPI equation. This calculation has not been validated in all clinical situations. eGFR's persistently <60 mL/min signify possible Chronic Kidney Disease.    Anion gap 8 5 - 15  Procalcitonin     Status: None   Collection Time: 08/02/16  2:42 AM  Result Value Ref Range   Procalcitonin 0.16 ng/mL    Comment:        Interpretation: PCT (Procalcitonin) <= 0.5 ng/mL: Systemic infection (sepsis) is not likely. Local bacterial infection is possible. (NOTE)         ICU PCT Algorithm               Non ICU PCT Algorithm    ----------------------------     ------------------------------         PCT < 0.25 ng/mL                 PCT < 0.1 ng/mL     Stopping of antibiotics            Stopping of antibiotics       strongly encouraged.               strongly encouraged.    ----------------------------      ------------------------------       PCT level decrease by               PCT < 0.25 ng/mL       >= 80% from peak PCT       OR PCT 0.25 - 0.5 ng/mL          Stopping of antibiotics                                             encouraged.     Stopping of antibiotics           encouraged.    ----------------------------     ------------------------------       PCT level decrease by              PCT >= 0.25 ng/mL       < 80% from peak PCT        AND PCT >= 0.5 ng/mL            Continuin g antibiotics  encouraged.       Continuing antibiotics            encouraged.    ----------------------------     ------------------------------     PCT level increase compared          PCT > 0.5 ng/mL         with peak PCT AND          PCT >= 0.5 ng/mL             Escalation of antibiotics                                          strongly encouraged.      Escalation of antibiotics        strongly encouraged.   Magnesium     Status: Abnormal   Collection Time: 08/02/16  2:42 AM  Result Value Ref Range   Magnesium 2.6 (H) 1.7 - 2.4 mg/dL  Phosphorus     Status: Abnormal   Collection Time: 08/02/16  2:42 AM  Result Value Ref Range   Phosphorus 2.1 (L) 2.5 - 4.6 mg/dL  TSH     Status: None   Collection Time: 08/02/16  2:42 AM  Result Value Ref Range   TSH 0.911 0.350 - 4.500 uIU/mL    Comment: Performed by a 3rd Generation assay with a functional sensitivity of <=0.01 uIU/mL.  Hemoglobin A1c     Status: Abnormal   Collection Time: 08/02/16  2:42 AM  Result Value Ref Range   Hgb A1c MFr Bld 6.0 (H) 4.8 - 5.6 %    Comment: (NOTE)         Pre-diabetes: 5.7 - 6.4         Diabetes: >6.4         Glycemic control for adults with diabetes: <7.0    Mean Plasma Glucose 126 mg/dL    Comment: (NOTE) Performed At: [facility] [place], [place] [id number] Lindon Romp MD DG:[id number]   Lactic acid, plasma     Status: Abnormal    Collection Time: 08/02/16  2:49 AM  Result Value Ref Range   Lactic Acid, Venous 2.7 (HH) 0.5 - 1.9 mmol/L    Comment: CRITICAL RESULT CALLED TO, READ BACK BY AND VERIFIED WITH: MELISSA OBRYANT,RN [id number] @ 0348 BY J SCOTTON   Glucose, capillary     Status: Abnormal   Collection Time: 08/02/16  4:29 AM  Result Value Ref Range   Glucose-Capillary 156 (H) 65 - 99 mg/dL  Lactic acid, plasma     Status: Abnormal   Collection Time: 08/02/16  5:57 AM  Result Value Ref Range   Lactic Acid, Venous 2.0 (HH) 0.5 - 1.9 mmol/L    Comment: CRITICAL RESULT CALLED TO, READ BACK BY AND VERIFIED WITH: K SULLIVAN AT 0644 ON 01.14.2018 BY NBROOKS   Glucose, capillary     Status: Abnormal   Collection Time: 08/02/16  8:19 AM  Result Value Ref Range   Glucose-Capillary 116 (H) 65 - 99 mg/dL  Basic metabolic panel     Status: Abnormal   Collection Time: 08/02/16  9:59 AM  Result Value Ref Range   Sodium 159 (H) 135 - 145 mmol/L   Potassium 4.0 3.5 - 5.1 mmol/L   Chloride 129 (H) 101 - 111 mmol/L   CO2 21 (L) 22 - 32 mmol/L  Glucose, Bld 133 (H) 65 - 99 mg/dL   BUN 70 (H) 6 - 20 mg/dL   Creatinine, Ser 0.90 0.44 - 1.00 mg/dL   Calcium 10.2 8.9 - 10.3 mg/dL   GFR calc non Af Amer 55 (L) >60 mL/min   GFR calc Af Amer >60 >60 mL/min    Comment: (NOTE) The eGFR has been calculated using the CKD EPI equation. This calculation has not been validated in all clinical situations. eGFR's persistently <60 mL/min signify possible Chronic Kidney Disease.    Anion gap 9 5 - 15  Glucose, capillary     Status: Abnormal   Collection Time: 08/02/16 12:15 PM  Result Value Ref Range   Glucose-Capillary 125 (H) 65 - 99 mg/dL  Basic metabolic panel     Status: Abnormal   Collection Time: 08/02/16  2:00 PM  Result Value Ref Range   Sodium 159 (H) 135 - 145 mmol/L   Potassium 3.7 3.5 - 5.1 mmol/L   Chloride 129 (H) 101 - 111 mmol/L   CO2 20 (L) 22 - 32 mmol/L   Glucose, Bld 152 (H) 65 - 99 mg/dL   BUN 66 (H) 6  - 20 mg/dL   Creatinine, Ser 1.03 (H) 0.44 - 1.00 mg/dL   Calcium 10.0 8.9 - 10.3 mg/dL   GFR calc non Af Amer 46 (L) >60 mL/min   GFR calc Af Amer 54 (L) >60 mL/min    Comment: (NOTE) The eGFR has been calculated using the CKD EPI equation. This calculation has not been validated in all clinical situations. eGFR's persistently <60 mL/min signify possible Chronic Kidney Disease.    Anion gap 10 5 - 15  Glucose, capillary     Status: Abnormal   Collection Time: 08/02/16  4:42 PM  Result Value Ref Range   Glucose-Capillary 158 (H) 65 - 99 mg/dL  Basic metabolic panel     Status: Abnormal   Collection Time: 08/02/16  7:04 PM  Result Value Ref Range   Sodium 156 (H) 135 - 145 mmol/L   Potassium 3.9 3.5 - 5.1 mmol/L   Chloride 125 (H) 101 - 111 mmol/L   CO2 22 22 - 32 mmol/L   Glucose, Bld 205 (H) 65 - 99 mg/dL   BUN 60 (H) 6 - 20 mg/dL   Creatinine, Ser 0.98 0.44 - 1.00 mg/dL   Calcium 9.7 8.9 - 10.3 mg/dL   GFR calc non Af Amer 49 (L) >60 mL/min   GFR calc Af Amer 57 (L) >60 mL/min    Comment: (NOTE) The eGFR has been calculated using the CKD EPI equation. This calculation has not been validated in all clinical situations. eGFR's persistently <60 mL/min signify possible Chronic Kidney Disease.    Anion gap 9 5 - 15  Glucose, capillary     Status: Abnormal   Collection Time: 08/02/16  8:00 PM  Result Value Ref Range   Glucose-Capillary 174 (H) 65 - 99 mg/dL   Comment 1 Notify RN   Basic metabolic panel     Status: Abnormal   Collection Time: 08/02/16  9:58 PM  Result Value Ref Range   Sodium 154 (H) 135 - 145 mmol/L   Potassium 3.6 3.5 - 5.1 mmol/L   Chloride 124 (H) 101 - 111 mmol/L   CO2 20 (L) 22 - 32 mmol/L   Glucose, Bld 208 (H) 65 - 99 mg/dL   BUN 59 (H) 6 - 20 mg/dL   Creatinine, Ser 1.02 (H) 0.44 - 1.00 mg/dL  Calcium 9.6 8.9 - 10.3 mg/dL   GFR calc non Af Amer 47 (L) >60 mL/min   GFR calc Af Amer 54 (L) >60 mL/min    Comment: (NOTE) The eGFR has been  calculated using the CKD EPI equation. This calculation has not been validated in all clinical situations. eGFR's persistently <60 mL/min signify possible Chronic Kidney Disease.    Anion gap 10 5 - 15  Glucose, capillary     Status: Abnormal   Collection Time: 08/03/16 12:03 AM  Result Value Ref Range   Glucose-Capillary 168 (H) 65 - 99 mg/dL  Basic metabolic panel     Status: Abnormal   Collection Time: 08/03/16  1:04 AM  Result Value Ref Range   Sodium 151 (H) 135 - 145 mmol/L   Potassium 4.4 3.5 - 5.1 mmol/L    Comment: QUANTITY NOT SUFFICIENT TO REPEAT TEST DELTA CHECK NOTED SLIGHT HEMOLYSIS    Chloride 124 (H) 101 - 111 mmol/L   CO2 21 (L) 22 - 32 mmol/L   Glucose, Bld 185 (H) 65 - 99 mg/dL   BUN 55 (H) 6 - 20 mg/dL   Creatinine, Ser 0.95 0.44 - 1.00 mg/dL   Calcium 9.4 8.9 - 10.3 mg/dL   GFR calc non Af Amer 51 (L) >60 mL/min   GFR calc Af Amer 59 (L) >60 mL/min    Comment: (NOTE) The eGFR has been calculated using the CKD EPI equation. This calculation has not been validated in all clinical situations. eGFR's persistently <60 mL/min signify possible Chronic Kidney Disease.    Anion gap 6 5 - 15  Glucose, capillary     Status: Abnormal   Collection Time: 08/03/16  4:06 AM  Result Value Ref Range   Glucose-Capillary 128 (H) 65 - 99 mg/dL  Basic metabolic panel     Status: Abnormal   Collection Time: 08/03/16  5:38 AM  Result Value Ref Range   Sodium 153 (H) 135 - 145 mmol/L   Potassium 3.7 3.5 - 5.1 mmol/L   Chloride 122 (H) 101 - 111 mmol/L   CO2 23 22 - 32 mmol/L   Glucose, Bld 163 (H) 65 - 99 mg/dL   BUN 53 (H) 6 - 20 mg/dL   Creatinine, Ser 0.89 0.44 - 1.00 mg/dL   Calcium 9.4 8.9 - 10.3 mg/dL   GFR calc non Af Amer 55 (L) >60 mL/min   GFR calc Af Amer >60 >60 mL/min    Comment: (NOTE) The eGFR has been calculated using the CKD EPI equation. This calculation has not been validated in all clinical situations. eGFR's persistently <60 mL/min signify  possible Chronic Kidney Disease.    Anion gap 8 5 - 15  Glucose, capillary     Status: Abnormal   Collection Time: 08/03/16  8:01 AM  Result Value Ref Range   Glucose-Capillary 142 (H) 65 - 99 mg/dL    Dg Chest 2 View  Result Date: 08/01/2016 CLINICAL DATA:  Pt BIB EMS. She is from home. Her daughter reports she has had a productive cough since this morning. She has a hx of cognitive disorder. EMS reports she is being treated for a sacral and decubitus ulcer. She has hx of DM. Blood glucose 340 per EMS. She is not on any DM medication. She is a full code. No acute distress. Ex-smoker, asthma, diabetic, htn EXAM: CHEST  2 VIEW COMPARISON:  07/02/2016 FINDINGS: Cardiac silhouette is normal in size. No mediastinal or hilar masses or convincing adenopathy. Clear lungs.  No pleural effusion or pneumothorax. IMPRESSION: No acute cardiopulmonary disease. Electronically Signed   By: Lajean Manes M.D.   On: 08/01/2016 19:48    Review of Systems  Constitutional: Positive for weight loss (daughter says she has not lost weight, but she looks very malnourished with marked atrophy of both right and left legs.).       Chronically ill elderly female. She is currently nonverbal, but does moan. Review of systems comes from her daughter, who is a CNA. She's been solely responsible for her mother's care up to this point. She has no family support, aside from her husband.  HENT: Negative.   Eyes: Negative.        Left eye remains partially closed.  Respiratory: Positive for cough (Does not appear to be a productive cough.). Negative for hemoptysis, sputum production, shortness of breath and wheezing.   Cardiovascular: Negative.   Gastrointestinal:       No change in her stools, no blood. She was taking some liquids she had a milkshake yesterday.  Genitourinary: Negative.   Musculoskeletal:       She is nonambulatory. The time. For this is rather vague when I asked her daughter she says for some time. She  does have a hospital bed but appears to be more comfortable up in her lift chair. She currently spends most of her time in the lift chair.  Skin:       She has an unstageable sacral it is 9 x 10 cm she had a mid thoracic pressure injury that is 3 x 0.4 cm rather purple maroon but no skin breakdown. She has bilateral ischial tuberosity pressure sores she has pressure relief boots on both lower extremities. Elbows show frail skin thinning in these areas. They have been dressed also.  Neurological: Positive for weakness.       She is nonresponsive verbally, except for some moaning when she is moved around. Her daughter does not she's been moaning prior to our moving her around.  Endo/Heme/Allergies: Bruises/bleeds easily (the area up over her spine is really just deep bruising.).  Psychiatric/Behavioral:       Cannot assess.   Blood pressure (!) 112/48, pulse 69, temperature 98.5 F (36.9 C), temperature source Axillary, resp. rate 20, height _0  (1.6 m), weight 50.4 kg (111 lb 1.8 oz), SpO2 100 %. Physical Exam  Constitutional:  83 year old female in bed. Verbally nonresponsive, her daughter says she's been this way for about 6 months. She is nonambulatory in the bed. She cannot help assist with being moved within the bed. All positioning has to be done by the caretaker. She appears chronically ill, frail, significant deconditioning, and malnourished.  HENT:  Mouth/Throat: No oropharyngeal exudate.  Eyes: Right eye exhibits no discharge. Left eye exhibits no discharge. No scleral icterus.  Left eye never opens completely. Her daughter notes she likes watching TV still, and has some favorites.  Neck: Normal range of motion. No JVD present. No tracheal deviation present. No thyromegaly present.  Cardiovascular: Normal rate, regular rhythm and normal heart sounds.   No murmur heard. Got a pulse on the left PT, I did not have any luck finding PT or DP on the right. She is in a boot with socks on,  so I did not see her foot or toes on either side.  Respiratory: Effort normal and breath sounds normal. No respiratory distress. She has no wheezes. She has no rales. She exhibits no tenderness.  She has a cough but  it appears to be dry.  GI: Soft. Bowel sounds are normal. She exhibits mass (She has a more dense and firm area mid lower abdomen. It's not as soft as the rest of her abdomen, nor does seem to be tender on palpation.). She exhibits no distension. There is no tenderness. There is no rebound and no guarding.  Mid lower abdomen, area of increased density. Nontender, no palpable organomegaly.  Musculoskeletal: She exhibits no edema or tenderness.  Lymphadenopathy:    She has no cervical adenopathy.  Neurological:  Patient is looking on this but she is completely nonverbal. She moans and groans some. Worse with moving her around for the exam.  Skin: Skin is warm and dry.  She has an unstageable sacral decubitus which is 9 x 10 cm with firmly adherent eschar there is some openings at the apex and around the borders. It was malodorous yesterday but is clean currently with no odor. The borders are clean. With wet-to-dry dressing in place. She has a small amount of brownish green stool at the rectum. No blood.  She has a midthoracic deep pressure issue to her back over the spinal column that is 3 x 0.4 cm skin is intact. She also had bilateral ischial tuberosity injuries 2 x 3 cm which are currently covered. She has some thinning over both elbows also.   Psychiatric:  Patient is nonverbal, nonambulatory, her only response to exam and manipulation is to moan.   She is on her right side, and this is her back sites.    Again she is on her right side, currently the wound is clean and dry.  On wet to dry dressing with Dakins.  The eschar appears to be full thickness.  Assessment/Plan: Large 9 x 10 sacral decubitus. It has an adherent eschar and is currently clean. She has a couple open sites  that are no more than 1 x 0.8 cm these are clean.  Deep pressure injury, posterior spinal column Nonverbal/nonambulatory - bed to lift chair only, her daughter does everything else for her Remote history of tobacco use Severe deconditioning and malnutrition Abnormal abdomen exam Hypernatremia/hyperchloremia Elevated lactate Hx of AF with RVR, not an anticoagulation candidate. Elevated glucose/normal LFTs. Cough with essentially normal chest x-ray. DO NOT RESUSCITATE/DO NOT INTUBATE - her daughter is considering palliative, but is unclear about the process.   Plan: Considering the patient's severe level of debility, malnutrition, and mental cognition; her decubitus is not unexpected. Based on the duration of her current condition, I would expect it to be much worse. I think her daughter needs to be commended for the care she is taking care of her progressively ill mother. Cecille Rubin McNichols has recommended Dakin solution to the open sacral decubitus, and hydrotherapy, to the open sacral decubitus. I would agree and continue this. We could remove the eschar surgically, but it's my opinion she will never heal this area. She is not a candidate for any type of muscle flap. I agree with recommendation for palliative evaluation. Her daughter also needs case Freight forwarder, and social worker to assist with arranging care in the future. Her daughter is physically exhausted from ongoing care of her mother. Dr. Harlow Asa, will review this and we will follow intermittently throughout the week. Palliative consult is pending.     Kallum Jorgensen 08/03/2016, 11:53 AM

## 2016-08-03 NOTE — Progress Notes (Signed)
Patient's CBG was 40,  given 25 ml of 50% dextrose injection and rechecked CBG in 15 mins was 150

## 2016-08-04 DIAGNOSIS — Z515 Encounter for palliative care: Secondary | ICD-10-CM

## 2016-08-04 DIAGNOSIS — L8940 Pressure ulcer of contiguous site of back, buttock and hip, unspecified stage: Secondary | ICD-10-CM

## 2016-08-04 DIAGNOSIS — R52 Pain, unspecified: Secondary | ICD-10-CM | POA: Diagnosis present

## 2016-08-04 DIAGNOSIS — E43 Unspecified severe protein-calorie malnutrition: Secondary | ICD-10-CM

## 2016-08-04 DIAGNOSIS — R627 Adult failure to thrive: Secondary | ICD-10-CM

## 2016-08-04 LAB — BASIC METABOLIC PANEL
Anion gap: 7 (ref 5–15)
Anion gap: 10 (ref 5–15)
Anion gap: 9 (ref 5–15)
BUN: 36 mg/dL — ABNORMAL HIGH (ref 6–20)
BUN: 35 mg/dL — ABNORMAL HIGH (ref 6–20)
BUN: 33 mg/dL — ABNORMAL HIGH (ref 6–20)
CALCIUM: 8.8 mg/dL — AB (ref 8.9–10.3)
CALCIUM: 8.9 mg/dL (ref 8.9–10.3)
CALCIUM: 8.9 mg/dL (ref 8.9–10.3)
CO2: 18 mmol/L — AB (ref 22–32)
CO2: 17 mmol/L — ABNORMAL LOW (ref 22–32)
CO2: 17 mmol/L — AB (ref 22–32)
CREATININE: 0.98 mg/dL (ref 0.44–1.00)
CREATININE: 0.93 mg/dL (ref 0.44–1.00)
CREATININE: 0.77 mg/dL (ref 0.44–1.00)
Chloride: 120 mmol/L — ABNORMAL HIGH (ref 101–111)
Chloride: 119 mmol/L — ABNORMAL HIGH (ref 101–111)
Chloride: 119 mmol/L — ABNORMAL HIGH (ref 101–111)
GFR calc non Af Amer: 53 mL/min — ABNORMAL LOW (ref >60)
GFR calc non Af Amer: >60 mL/min (ref >60)
GFR, EST AFRICAN AMERICAN: 57 mL/min — AB (ref >60)
GFR, EST NON AFRICAN AMERICAN: 49 mL/min — AB (ref >60)
Glucose, Bld: 143 mg/dL — ABNORMAL HIGH (ref 65–99)
Glucose, Bld: 103 mg/dL — ABNORMAL HIGH (ref 65–99)
Glucose, Bld: 123 mg/dL — ABNORMAL HIGH (ref 65–99)
Potassium: 3 mmol/L — ABNORMAL LOW (ref 3.5–5.1)
Potassium: 3.3 mmol/L — ABNORMAL LOW (ref 3.5–5.1)
Potassium: 4 mmol/L (ref 3.5–5.1)
SODIUM: 145 mmol/L (ref 135–145)
SODIUM: 146 mmol/L — AB (ref 135–145)
Sodium: 145 mmol/L (ref 135–145)

## 2016-08-04 LAB — MAGNESIUM: Magnesium: 2 mg/dL (ref 1.7–2.4)

## 2016-08-04 LAB — GLUCOSE, CAPILLARY
GLUCOSE-CAPILLARY: 109 mg/dL — AB (ref 65–99)
Glucose-Capillary: 100 mg/dL — ABNORMAL HIGH (ref 65–99)
Glucose-Capillary: 107 mg/dL — ABNORMAL HIGH (ref 65–99)

## 2016-08-04 MED ORDER — GUAIFENESIN 200 MG PO TABS
200.0000 mg | ORAL_TABLET | ORAL | Status: DC
  Filled 2016-08-04 (×3): qty 1

## 2016-08-04 MED ORDER — FENTANYL CITRATE (PF) 100 MCG/2ML IJ SOLN
12.5000 ug | Freq: Once | INTRAMUSCULAR | Status: AC
  Administered 2016-08-04: 12.5 ug via INTRAVENOUS
  Filled 2016-08-04: qty 2

## 2016-08-04 MED ORDER — ASPIRIN 81 MG PO CHEW: 81.0000 mg | CHEWABLE_TABLET | Freq: Every day | ORAL | Status: DC

## 2016-08-04 MED ORDER — MORPHINE SULFATE (CONCENTRATE) 10 MG/0.5ML PO SOLN
5.0000 mg | ORAL | Status: DC
  Administered 2016-08-04 – 2016-08-07 (×14): 5 mg via ORAL
  Filled 2016-08-04 (×14): qty 0.5

## 2016-08-04 MED ORDER — MORPHINE SULFATE (CONCENTRATE) 10 MG/0.5ML PO SOLN
5.0000 mg | ORAL | Status: DC | PRN
  Administered 2016-08-04 – 2016-08-05 (×4): 5 mg via ORAL
  Filled 2016-08-04 (×4): qty 0.5

## 2016-08-04 MED ORDER — POTASSIUM CHLORIDE 2 MEQ/ML IV SOLN
INTRAVENOUS | Status: DC
  Administered 2016-08-04: 10:00:00 via INTRAVENOUS
  Filled 2016-08-04: qty 1000

## 2016-08-04 MED ORDER — LORAZEPAM 1 MG PO TABS: 1.0000 mg | ORAL_TABLET | ORAL | Status: DC | PRN

## 2016-08-04 NOTE — Progress Notes (Signed)
PROGRESS NOTE    Wendy Vasquez  AVW:[id number] DOB: [birth year] DOA: 08/01/2016 PCP: Pamelia Hoit, MD    Brief Narrative:  Wendy Vasquez is a 83 y.o. female with medical history significant of  GERD, frequent UTIs, arthritis, dementia, DM 2 diet controlled    Presented with productive cough since this a.m.  Patient has known history of sacral decubitus which at first was on a quarter size but has progressed to baseball size. Patient lives at home with her daughter. At her baseline patient is nonverbal and nonambulatory. She is only able to make eye contact patient herself unable to provide any history. Reports patient had history of pneumonia last year that she required hospitalization. She has recently started on Keflex because she had strong smelling urine. Patient's son in law has been diagnosed with flu 2 days ago. On EMS arrival blood sugar elevated 340 Regarding pertinent Chronic problems: Patient has mild asthma and diabetes which is diet-controlled patient has remote history of gastric ulcers requiring blood transfusion.  Patient started on broad spectrum antibiotics and was found to be hypernatremic- likely from volume depletion and sodium wasting.  She was given IVF to help bring down sodium level.  Patient was seen by SLP who found severe cognitive deficits severe oropharyngeal dysphagia of cognitive etiology. Pt with advanced dementia, unable to follow commands and is nonverbal. Full feeding assistance required throughout PO trials. Patient was found to have a sacral decubitus ulcer that was assessed by WOC and found to be Necrotic and malodorous Unstageable sacral pressure injury requiring surgical debridement or hydrotherapy administered by PT with conservative sharp wound debridement (CSWD).  Wound care states that if MD agrees order Surgical ([facility]) consult to begin evaluative process. Discussed with patient's daughter who voices she would like surgery to consult and see what options  are.  Discussed long term goals of care and that patient is significantly demented, has numerous wounds and is malnourished and cachectic with a significant risk of aspiration.  Daughter agreed to palliative care consult.   Assessment & Plan:   Principal Problem:   Sepsis (HCC) Active Problems:   HTN (hypertension)   Asthma   A-fib (HCC)   Decubitus ulcer   Protein-calorie malnutrition, severe   Palliative care by specialist   Pain, generalized   Adult failure to thrive   Goals of care - discussed illness of patient and disease progression with daughter - daughter would like to continue antibiotics and carefully decrease sodium - daughter understands patient has been declining - palliative care consulted as patient has significant risk of recurrence of infection from sacral ulcer and poses a significant aspiration risk  - transition to full comfort care - social work consulted for hospice placement  Sepsis (HCC) - treat with IV fluids - blood cultures showing NG x 1 d - Influenza PCR negative - respiratory panel negative - comfort care only  Hypernatremia - stop blood draws - transition to comfort care only  Decubitus ulcer  - wound care consulted and significant pressure injury appreciated - continue antibiotics of zosyn and vancomycin - gen surgery consulted- appreciate their input - numerous other ulcers noted - location, debility and nutritional status make healing unlikely - comfort care only  A-fib (HCC)  - patient not a candidate for anticoagulation given advanced age and multiple comorbidities including history of GI bleed per family she has been in sinus rhythm for years  Chronic medical problems: patient is transitioning to comfort care only,  Inpatient hospice recommended.  Social work consulted by palliative care for inpatient hospice placement.   DVT prophylaxis:    Lovenox    Code Status:    DNR/DNI   as per  family  Family Communication:   no  family present at time of exam Disposition Plan: inpatient hospice   Consultants:   General Surgery  Palliative Care  Procedures:   None  Antimicrobials:   Zosyn  Vancomycin    Subjective: Patient nonverbal.  One eye is open.  No communication.  Objective: Vitals:   08/03/16 1430 08/03/16 2238 08/04/16 0335 08/04/16 1441  BP: 98/65 (!) 94/47 (!) 100/47 105/60  Pulse: 79 79 82 81  Resp: 18 13 16 16   Temp: 98.5 F (36.9 C) 98.6 F (37 C) 100 F (37.8 C) 99.4 F (37.4 C)  TempSrc: Axillary Oral Axillary Axillary  SpO2: 100% 99% 99% 99%  Weight:      Height:        Intake/Output Summary (Last 24 hours) at 08/04/16 1445 Last data filed at 08/04/16 1406  Gross per 24 hour  Intake          1491.25 ml  Output                0 ml  Net          1491.25 ml   Filed Weights   08/02/16 0025  Weight: 50.4 kg (111 lb 1.8 oz)    Examination:  General exam: Appears calm and comfortable  Respiratory system:  Respiratory effort normal. Crackles at lung bases and upper airway noises Cardiovascular system: S1 & S2 heard, RRR. No JVD, murmurs, rubs, gallops or clicks. No pedal edema. Gastrointestinal system: Abdomen is nondistended, soft and nontender. No organomegaly or masses felt. Normal bowel sounds heard. Central nervous system: unclear if patient is alert; unable to assess CN Extremities: patient did not move arms or legs during exam Skin: 9cm x 10cm sacral ulcer with black escharge and small 1cm opening Psychiatry: unable to assess.     Data Reviewed: I have personally reviewed following labs and imaging studies  CBC:  Recent Labs Lab 08/01/16 2039 08/02/16 0242  WBC 18.4* 15.8*  NEUTROABS 15.1* 12.7*  HGB 14.7 12.7  HCT 45.5 41.4  MCV 97.8 101.5*  PLT 256 207   Basic Metabolic Panel:  Recent Labs Lab 08/02/16 0242  08/03/16 1950 08/03/16 2247 08/04/16 0104 08/04/16 0607 08/04/16 0937  NA 156*  < > 146* 146* 145 146* 145  K 4.0  < > 3.0*  2.9* 3.0* 3.3* 4.0  CL 126*  < > 119* 120* 120* 119* 119*  CO2 22  < > 18* 18* 18* 17* 17*  GLUCOSE 222*  < > 160* 159* 143* 103* 123*  BUN 74*  < > 37* 37* 36* 35* 33*  CREATININE 0.99  < > 0.96 0.96 0.98 0.93 0.77  CALCIUM 10.2  < > 8.9 8.8* 8.8* 8.9 8.9  MG 2.6*  --   --   --   --   --  2.0  PHOS 2.1*  --   --   --   --   --   --   < > = values in this interval not displayed. GFR: Estimated Creatinine Clearance: 37.2 mL/min (by C-G formula based on SCr of 0.77 mg/dL). Liver Function Tests:  Recent Labs Lab 08/02/16 0242  AST 32  ALT 32  ALKPHOS 83  BILITOT 0.8  PROT 6.7  ALBUMIN 2.9*   No results for  input(s): LIPASE, AMYLASE in the last 168 hours. No results for input(s): AMMONIA in the last 168 hours. Coagulation Profile: No results for input(s): INR, PROTIME in the last 168 hours. Cardiac Enzymes: No results for input(s): CKTOTAL, CKMB, CKMBINDEX, TROPONINI in the last 168 hours. BNP (last 3 results) No results for input(s): PROBNP in the last 8760 hours. HbA1C:  Recent Labs  08/02/16 0242  HGBA1C 6.0*   CBG:  Recent Labs Lab 08/03/16 2021 08/03/16 2342 08/04/16 0337 08/04/16 0750 08/04/16 1144  GLUCAP 152* 151* 100* 109* 107*   Lipid Profile: No results for input(s): CHOL, HDL, LDLCALC, TRIG, CHOLHDL, LDLDIRECT in the last 72 hours. Thyroid Function Tests:  Recent Labs  08/02/16 0242  TSH 0.911   Anemia Panel: No results for input(s): VITAMINB12, FOLATE, FERRITIN, TIBC, IRON, RETICCTPCT in the last 72 hours. Sepsis Labs:  Recent Labs Lab 08/01/16 2039 08/01/16 2240 08/02/16 0242 08/02/16 0249 08/02/16 0557  PROCALCITON  --   --  0.16  --   --   LATICACIDVEN 2.8* 2.2*  --  2.7* 2.0*    Recent Results (from the past 240 hour(s))  Culture, blood (Routine X 2) w Reflex to ID Panel     Status: None (Preliminary result)   Collection Time: 08/01/16  8:40 PM  Result Value Ref Range Status   Specimen Description BLOOD RIGHT FOREARM  Final    Special Requests BOTTLES DRAWN AEROBIC AND ANAEROBIC  Final   Culture   Final    NO GROWTH 1 DAY Performed at [facility]    Report Status PENDING  Incomplete  Culture, blood (Routine X 2) w Reflex to ID Panel     Status: None (Preliminary result)   Collection Time: 08/01/16  8:40 PM  Result Value Ref Range Status   Specimen Description BLOOD RIGHT HAND  Final   Special Requests BOTTLES DRAWN AEROBIC AND ANAEROBIC  Final   Culture   Final    NO GROWTH 1 DAY Performed at [facility]    Report Status PENDING  Incomplete  Respiratory Panel by PCR     Status: None   Collection Time: 08/02/16 12:57 AM  Result Value Ref Range Status   Adenovirus NOT DETECTED NOT DETECTED Final   Coronavirus 229E NOT DETECTED NOT DETECTED Final   Coronavirus HKU1 NOT DETECTED NOT DETECTED Final   Coronavirus NL63 NOT DETECTED NOT DETECTED Final   Coronavirus OC43 NOT DETECTED NOT DETECTED Final   Metapneumovirus NOT DETECTED NOT DETECTED Final   Rhinovirus / Enterovirus NOT DETECTED NOT DETECTED Final   Influenza A NOT DETECTED NOT DETECTED Final   Influenza B NOT DETECTED NOT DETECTED Final   Parainfluenza Virus 1 NOT DETECTED NOT DETECTED Final   Parainfluenza Virus 2 NOT DETECTED NOT DETECTED Final   Parainfluenza Virus 3 NOT DETECTED NOT DETECTED Final   Parainfluenza Virus 4 NOT DETECTED NOT DETECTED Final   Respiratory Syncytial Virus NOT DETECTED NOT DETECTED Final   Bordetella pertussis NOT DETECTED NOT DETECTED Final   Chlamydophila pneumoniae NOT DETECTED NOT DETECTED Final   Mycoplasma pneumoniae NOT DETECTED NOT DETECTED Final    Comment: Performed at [facility]  MRSA PCR Screening     Status: None   Collection Time: 08/02/16  1:30 AM  Result Value Ref Range Status   MRSA by PCR NEGATIVE NEGATIVE Final    Comment:        The GeneXpert MRSA Assay (FDA approved for NASAL specimens only), is one  component of a comprehensive MRSA  colonization surveillance program. It is not intended to diagnose MRSA infection nor to guide or monitor treatment for MRSA infections.   Urine culture     Status: None   Collection Time: 08/02/16  1:59 AM  Result Value Ref Range Status   Specimen Description URINE, CLEAN CATCH  Final   Special Requests NONE  Final   Culture NO GROWTH Performed at [facility]   Final   Report Status 08/03/2016 FINAL  Final         Radiology Studies: No results found.      Scheduled Meds: . chlorhexidine  15 mL Mouth Rinse BID  . mouth rinse  15 mL Mouth Rinse q12n4p  . morphine CONCENTRATE  5 mg Oral Q4H  . sodium chloride flush  3 mL Intravenous Q12H  . sodium hypochlorite   Irrigation BID   Continuous Infusions:    LOS: 3 days    Time spent: 35 minutes    Katrinka Blazing, MD [facility] Pager [phone]  If 7PM-7AM, please contact night-coverage [email or web address] Password [email or web address] 08/04/2016, 2:45 PM

## 2016-08-04 NOTE — Progress Notes (Signed)
Resumed care of patient. Agree with previous assessment. Will continue to monitor. 

## 2016-08-04 NOTE — Progress Notes (Signed)
CSW received consult for residential hospice placement. CSW confirmed with patient's daughter, Babette Relicammy [facility]. CSW faxed referral over - awaiting response back re: bed availability/eligibility.    Lincoln MaxinKelly Brooks Stotz, LCSW [facility] Clinical Social Worker cell #: [phone]

## 2016-08-04 NOTE — Progress Notes (Signed)
PT Cancellation Note  Patient Details Name: Wendy Vasquez MRN: [id number] DOB: [birth year]   Cancelled Treatment:    Reason Eval/Treat Not Completed: PT screened, no needs identified, will sign off (nonverbal, nonambulatory)   [facility] Hospital[facility]ILLIAMS,Matisha Termine 08/04/2016, 9:40 AM

## 2016-08-04 NOTE — Progress Notes (Signed)
Speech Language Pathology Treatment: Dysphagia  Patient Details Name: Wendy Vasquez MRN: [id number] DOB: 17-Oct-1923 Today's Date: 08/04/2016 Time: 5573-2202 SLP Time Calculation (min) (ACUTE ONLY): 9 min  Assessment / Plan / Recommendation Clinical Impression  F/u after initial swallowing evaluation- pt continues with poor intake, oral holding of POs, reduced frequency of spontaneous swallowing - all behaviors consistent with advanced dementia.  Attempts to feed pt this am were met with tightly sealed lips, moaning, so this clinician ceased efforts.  Per RN, Palliative care meeting is pending with family today.  SLP will follow for [place].  Continue current diet recommendations (dys 1, honey-thick) pending results of meeting.    HPI HPI: Wendy Alas Hicksis a 83 y.o.femalewith medical history significant of GERD, frequent UTIs, arthritis, dementia, DM 2 diet controlled. Patient has known history of sacral decubitus which at first was on a quarter size but has progressed to baseball size. Patient lives at home with her daughter. At her baseline patient is nonverbal and nonambulatory. She is only able to make eye contact patient herself unable to provide any history. Reports patient had history of pneumonia last year that she required hospitalization. She has recently started on Keflex because she had strong smelling urine. Patient's son in law has been diagnosed with flu 2 days ago. On EMS arrival blood sugar elevated 340 Chest x ray without acute cardiopulmonary findings.      SLP Plan  Continue with current plan of care     Recommendations  Diet recommendations: Dysphagia 1 (puree);Honey-thick liquid Liquids provided via: Teaspoon Medication Administration: Crushed with puree Supervision: Full supervision/cueing for compensatory strategies Compensations: Minimize environmental distractions;Slow rate;Small sips/bites Postural Changes and/or Swallow Maneuvers: Seated upright 90 degrees                 Oral Care Recommendations: Oral care BID Follow up Recommendations: 24 hour supervision/assistance Plan: Continue with current plan of care       GO                Wendy Vasquez 08/04/2016, 12:35 PM

## 2016-08-04 NOTE — Progress Notes (Signed)
  Subjective: Resting no response to me coming into the room.    Objective: Vital signs in last 24 hours: Temp:  [98.6 F (37 C)-100 F (37.8 C)] 99.4 F (37.4 C) (01/16 1441) Pulse Rate:  [79-82] 81 (01/16 1441) Resp:  [13-16] 16 (01/16 1441) BP: (94-105)/(47-60) 105/60 (01/16 1441) SpO2:  [99 %] 99 % (01/16 1441) Last BM Date: 08/03/16  Intake/Output from previous day: 01/15 0701 - 01/16 0700 In: 1216.3 [I.V.:1216.3] Out: -  Intake/Output this shift: Total I/O In: 275 [I.V.:275] Out: -   General appearance: sleeping, see pictures from yesterday  Lab Results:   Recent Labs  08/01/16 2039 08/02/16 0242  WBC 18.4* 15.8*  HGB 14.7 12.7  HCT 45.5 41.4  PLT 256 207    BMET  Recent Labs  08/04/16 0607 08/04/16 0937  NA 146* 145  K 3.3* 4.0  CL 119* 119*  CO2 17* 17*  GLUCOSE 103* 123*  BUN 35* 33*  CREATININE 0.93 0.77  CALCIUM 8.9 8.9   PT/INR No results for input(s): LABPROT, INR in the last 72 hours.   Recent Labs Lab 08/02/16 0242  AST 32  ALT 32  ALKPHOS 83  BILITOT 0.8  PROT 6.7  ALBUMIN 2.9*     Lipase     Component Value Date/Time   LIPASE 24 06/12/2010 1108     Studies/Results: No results found.  Medications: . chlorhexidine  15 mL Mouth Rinse BID  . mouth rinse  15 mL Mouth Rinse q12n4p  . morphine CONCENTRATE  5 mg Oral Q4H  . sodium chloride flush  3 mL Intravenous Q12H  . sodium hypochlorite   Irrigation BID      Assessment/Plan Large 9 x 10 sacral decubitus. It has an adherent eschar and is currently clean. She has a couple open sites that are no more than 1 x 0.8 cm these are clean.  Deep pressure injury, posterior spinal column Nonverbal/nonambulatory - bed to lift chair only, her daughter does everything else for her Remote history of tobacco use Severe deconditioning and malnutrition Abnormal abdomen exam Hypernatremia/hyperchloremia Elevated lactate Hx of AF with RVR, not an anticoagulation  candidate. Elevated glucose/normal LFTs. Cough with essentially normal chest x-ray. DO NOT RESUSCITATE/DO NOT INTUBATE -  Comfort care  FEN:  Dysphagia 1 ID:  Antibiotics discontinued DVT:  None  Pt going to comfort care.  We will be available as needed.      LOS: 3 days    Wendy Vasquez 08/04/2016 [phone]

## 2016-08-04 NOTE — Consult Note (Signed)
Consultation Note Date: 08/04/2016   Patient Name: Wendy Vasquez  DOB: Dec 08, 1923  MRN: [id number]  Age / Sex: 83 y.o., female  PCP: Barbie Banner, MD Referring Physician: Filbert Schilder, MD  Reason for Consultation: Establishing goals of care and Psychosocial/spiritual support  HPI/Patient Profile: 83 y.o. female   admitted on 08/01/2016 c/o  Cough, shortness of breath and  Fever.    Past medical history significant of  GERD, frequent UTIs, arthritis,  And overall failure to thrive, DM.   Patient has known history of sacral decubitus which at first was on a quarter size but has progressed to baseball size. Patient lives at home with her daughter.  Care needs have become great and family has requested home health assistance.  Per her daughter she has had continued physical, functional and cognitive decline over the past two years.  Family faces advanced directive decisions and anticipatory care needs   Clinical Assessment and Goals of Care:   This NP Lorinda Creed reviewed medical records, received report from team, assessed the patient and then meet at the patient's bedside along with her daughter/Wendy and niece/Wendy Vasquez to discuss diagnosis (detailed  disease trajectory of dementia specific to overall failure to thrive and poor po intake)  , prognosis, GOC, EOL wishes disposition and options.  A detailed discussion was had today regarding advanced directives.  Concepts specific to code status, artifical feeding and hydration, continued IV antibiotics and rehospitalization was had.  The difference between a aggressive medical intervention path  and a palliative comfort care path for this patient at this time was had.  Values and goals of care important to patient and family were attempted to be elicited.  MOST form introduced  Concept of Hospice and Palliative Care were discussed  Natural trajectory  and expectations at EOL were discussed.  Questions and concerns addressed.   Family encouraged to call with questions or concerns.  PMT will continue to support holistically.   NEXT OF KIN/ Wendy Vasquez/daughter    SUMMARY OF RECOMMENDATIONS    Code Status/Advance Care Planning:  DNR- focus of care is comfort   Symptom Management:   Chronic generalized pain 2/2 to DJD and sacral decubitus:  Roxanol 5 mg scheduled every 4 hr po/sl  Dyspnea/ breakthrough pain: Roxanol 5 mg po/sl every 1 hr prn  Agitation: Ativan 1 mg po/sl every 4 hrs prn  Dysphagia: comfort feeds as tolerated   Palliative Prophylaxis:   Aspiration, Bowel Regimen, Frequent Pain Assessment, Oral Care and Palliative Wound Care  Additional Recommendations (Limitations, Scope, Preferences):  Full Comfort Care, Minimize Medications, Initiate Comfort Feeding, No Artificial Feeding, No Diagnostics, No Glucose Monitoring, No IV Antibiotics, No IV Fluids and No Lab Draws  Psycho-social/Spiritual:   Desire for further Chaplaincy support:yes  Additional Recommendations: Education on Hospice and Grief/Bereavement Support  Prognosis:   Comfort is focus of care.  No further antibiotic use or artifical hydration. Patient is febrile  Basically no po intake.  Symptom management  needs.  < 2 weeks  Discharge  Planning: Hospice facility      Primary Diagnoses: Present on Admission: . A-fib (HCC) . Asthma . HTN (hypertension) . Sepsis (HCC) . Decubitus ulcer   I have reviewed the medical record, interviewed the patient and family, and examined the patient. The following aspects are pertinent.  Past Medical History:  Diagnosis Date  . Arthritis    "all over"  . Asthma    "a touch"  . Bleeding ulcer    requiring transfusions  . Bleeding ulcer   . Daily headache    "last couple days" (04/24/2014)  . Diabetes mellitus without complication (HCC)    "diet controlled"  . GERD (gastroesophageal reflux disease)     . H/O hiatal hernia   . History of blood transfusion    "related to bleeding ulcers"  . HLD (hyperlipidemia)   . Hypertension   . Migraine    hx  . Pneumonia X 1   Social History   Social History  . Marital status: Widowed    Spouse name: N/A  . Number of children: N/A  . Years of education: N/A   Social History Main Topics  . Smoking status: Former Smoker    Types: Cigarettes  . Smokeless tobacco: Never Used     Comment: 04/24/2014 "quit smoking in the 1970's or before; didn't smoke much; don't know for how long"  . Alcohol use No  . Drug use: No  . Sexual activity: No   Other Topics Concern  . None   Social History Narrative  . None   Family History  Problem Relation Age of Onset  . Colon cancer Mother    Scheduled Meds: . [START ON 08/05/2016] aspirin  81 mg Oral Daily  . chlorhexidine  15 mL Mouth Rinse BID  . enoxaparin (LOVENOX) injection  30 mg Subcutaneous Q24H  . guaiFENesin  200 mg Oral Q4H  . insulin aspart  0-9 Units Subcutaneous Q4H  . mouth rinse  15 mL Mouth Rinse q12n4p  . mometasone-formoterol  2 puff Inhalation BID  . multivitamin with minerals  1 tablet Oral Daily  . piperacillin-tazobactam (ZOSYN)  IV  3.375 g Intravenous Q8H  . sodium chloride flush  3 mL Intravenous Q12H  . sodium hypochlorite   Irrigation BID  . vancomycin  500 mg Intravenous Q24H   Continuous Infusions: . dextrose 5 % with kcl 75 mL/hr at 08/04/16 1026   PRN Meds:.acetaminophen **OR** acetaminophen, albuterol, HYDROcodone-acetaminophen, LORazepam, ondansetron **OR** ondansetron (ZOFRAN) IV Medications Prior to Admission:  Prior to Admission medications   Medication Sig Start Date End Date Taking? Authorizing Provider  acetaminophen (TYLENOL) 500 MG tablet Take 1,000 mg by mouth 2 (two) times daily as needed for mild pain or headache.    Yes Historical Provider, MD  acidophilus (RISAQUAD) CAPS capsule Take 1 capsule by mouth daily.   Yes Historical Provider, MD   aspirin EC 81 MG tablet Take 81 mg by mouth daily.   Yes Historical Provider, MD  calcium-vitamin D (CALCIUM 500/D) 500-200 MG-UNIT tablet Take 1 tablet by mouth 2 (two) times daily.   Yes Historical Provider, MD  cephALEXin (KEFLEX) 500 MG capsule Take 500 mg by mouth 2 (two) times daily. Started 1/10 x 10 days 07/29/16 08/08/16 Yes Historical Provider, MD  cholecalciferol (VITAMIN D) 1000 units tablet Take 1,000 Units by mouth daily.   Yes Historical Provider, MD  dextromethorphan (DELSYM) 30 MG/5ML liquid Take 60 mg by mouth as needed for cough.   Yes Historical Provider, MD  docusate  sodium (COLACE) 100 MG capsule Take 200 mg by mouth at bedtime as needed for mild constipation.    Yes Historical Provider, MD  esomeprazole (NEXIUM) 40 MG capsule Take 40 mg by mouth at bedtime.    Yes Historical Provider, MD  fluticasone (FLONASE) 50 MCG/ACT nasal spray Place 1 spray into both nostrils daily.   Yes Historical Provider, MD  Fluticasone-Salmeterol (ADVAIR) 250-50 MCG/DOSE AEPB Inhale 1 puff into the lungs 2 (two) times daily.   Yes Historical Provider, MD  LORazepam (ATIVAN) 1 MG tablet Take 0.5-1 mg by mouth 2 (two) times daily as needed for anxiety.   Yes Historical Provider, MD  Multiple Vitamin (MULTIVITAMIN WITH MINERALS) TABS tablet Take 1 tablet by mouth daily.   Yes Historical Provider, MD  nystatin cream (MYCOSTATIN) Apply 1 application topically 2 (two) times daily as needed (for itching/irritation).   Yes Historical Provider, MD  polyethylene glycol (MIRALAX / GLYCOLAX) packet Take 17 g by mouth daily as needed for mild constipation.   Yes Historical Provider, MD  tetrahydrozoline (VISINE) 0.05 % ophthalmic solution Place 1 drop into both eyes as needed (for dry eyes).    Yes Historical Provider, MD  vitamin B-12 (CYANOCOBALAMIN) 1000 MCG tablet Take 1,000 mcg by mouth daily.    Yes Historical Provider, MD  dextromethorphan-guaiFENesin (TUSSIN DM) 10-100 MG/5ML liquid Take 5 mLs by mouth  every 4 (four) hours as needed for cough. Patient not taking: Reported on 08/01/2016 07/02/16   Rise MuKenneth T Leaphart, PA-C   Allergies  Allergen Reactions  . Ciprofloxacin Hives  . Sulfa Antibiotics Anaphylaxis   Review of Systems  Unable to perform ROS: Dementia    Physical Exam  Constitutional: She appears listless. She appears cachectic. She appears ill.  Cardiovascular: Normal rate, regular rhythm and normal heart sounds.   Pulmonary/Chest: She has decreased breath sounds in the right lower field and the left lower field.  Musculoskeletal:  -generalized weakness and atrophy  Neurological: She appears listless. She displays atrophy. She exhibits abnormal muscle tone.  Skin:  - noted significant wounds, see EMR    Vital Signs: BP (!) 100/47 (BP Location: Right Arm)   Pulse 82   Temp 100 F (37.8 C) (Axillary)   Resp 16   Ht 5\' 3"  (1.6 m)   Wt 50.4 kg (111 lb 1.8 oz)   SpO2 99%   BMI 19.68 kg/m  Pain Assessment: PAINAD   Pain Score: Asleep   SpO2: SpO2: 99 % O2 Device:SpO2: 99 % O2 Flow Rate: .   IO: Intake/output summary:  Intake/Output Summary (Last 24 hours) at 08/04/16 1255 Last data filed at 08/04/16 1013  Gross per 24 hour  Intake          1216.25 ml  Output                0 ml  Net          1216.25 ml    LBM: Last BM Date: 08/03/16 Baseline Weight: Weight: 50.4 kg (111 lb 1.8 oz) Most recent weight: Weight: 50.4 kg (111 lb 1.8 oz)      Palliative Assessment/Data:  20 % at best   Flowsheet Rows   Flowsheet Row Most Recent Value  Intake Tab  Referral Department  Hospitalist  Unit at Time of Referral  Cardiac/Telemetry Unit  Palliative Care Primary Diagnosis  Sepsis/Infectious Disease  Date Notified  08/03/16  Palliative Care Type  New Palliative care  Reason for referral  Clarify Goals of Care  Date  of Admission  08/01/16  # of days IP prior to Palliative referral  2  Clinical Assessment  Psychosocial & Spiritual Assessment  Palliative Care  Outcomes     Discussed with Dr Rinaldo Ratel and Tresa Endo LCSW  Time In: 1300 Time Out: 1430 Time Total: 90 min Greater than 50%  of this time was spent counseling and coordinating care related to the above assessment and plan.  Signed by: Lorinda Creed, NP   Please contact Palliative Medicine Team phone at [phone] for questions and concerns.  For individual provider: See Loretha Stapler

## 2016-08-05 DIAGNOSIS — E870 Hyperosmolality and hypernatremia: Secondary | ICD-10-CM | POA: Diagnosis present

## 2016-08-05 DIAGNOSIS — A419 Sepsis, unspecified organism: Principal | ICD-10-CM

## 2016-08-05 NOTE — Discharge Summary (Addendum)
Physician Discharge Summary  Wendy Vasquez DOB: 08-13-1923 DOA: 08/01/2016  PCP: Wendy Hoit, MD  Admit date: 08/01/2016 Discharge date: 08/07/2016  Admitted From: Home Disposition:  Hospice   Discharge Condition: Terminal CODE STATUS: DNR  Diet recommendation: General, pleasure feeding   Brief/Interim Summary: Wendy Heinemann Hicksis a 83 y.o.femalewith medical history significant of GERD, frequent UTIs, arthritis, dementia, DM 2 diet controlled, who presented with productive cough. Patient has known history of sacral decubitus which at first was on a quarter size but has progressed to baseball size. Patient lives at home with her daughter. At her baseline patient is nonverbal and nonambulatory. She is only able to make eye contact patient herself unable to provide any history. Reports patient had history of pneumonia last year that she required hospitalization. She has recently started on Keflex because she had strong smelling urine. Patient's son in law has been diagnosed with flu 2 days ago. Patient was started on broad spectrum antibiotics and was found to be hypernatremic- likely from volume depletion and sodium wasting.  She was given IVF to help bring down sodium level.  Patient was seen by SLP who found severe cognitive deficits severe oropharyngeal dysphagia of cognitive etiology. Pt with advanced dementia, unable to follow commands and is nonverbal. Full feeding assistance required throughout PO trials. Patient was found to have a sacral decubitus ulcer that was assessed by Wendy Vasquez and found to be necrotic and malodorous unstageable sacral pressure injury requiring surgical debridement or hydrotherapy administered by PT with conservative sharp wound debridement (CSWD).  Wound care states that if MD agrees order Surgical (CCS) consult to begin evaluative process. Discussed with patient's daughter who voices she would like surgery to consult and see what options are.  Discussed long  term goals of care and that patient is significantly demented, has numerous wounds and is malnourished and cachectic with a significant risk of aspiration.  Daughter agreed to palliative care consult. Patient was eventually transitioned to comfort care, and discharged with hospice care.   Discharge Diagnoses:  Principal Problem:   Sepsis (HCC) Active Problems:   HTN (hypertension)   HLD (hyperlipidemia)   Asthma   A-fib (HCC)   Physical deconditioning   Pressure ulcer of contiguous region involving back and buttock   Protein-calorie malnutrition, severe   Palliative care by specialist   Pain, generalized   Adult failure to thrive   Hypernatremia Large Sacral Unstageable Pressure Injury measures 9cm x 10cm - POA   All medical therapies stopped and patient transitioned to comfort care and hospice.   Discharge Instructions   Allergies as of 08/07/2016      Reactions   Ciprofloxacin Hives   Sulfa Antibiotics Anaphylaxis      Medication List    STOP taking these medications   acidophilus Caps capsule   aspirin EC 81 MG tablet   CALCIUM 500/D 500-200 MG-UNIT tablet Generic drug:  calcium-vitamin D   cephALEXin 500 MG capsule Commonly known as:  KEFLEX   cholecalciferol 1000 units tablet Commonly known as:  VITAMIN D   dextromethorphan 30 MG/5ML liquid Commonly known as:  DELSYM   dextromethorphan-guaiFENesin 10-100 MG/5ML liquid Commonly known as:  TUSSIN DM   docusate sodium 100 MG capsule Commonly known as:  COLACE   esomeprazole 40 MG capsule Commonly known as:  NEXIUM   fluticasone 50 MCG/ACT nasal spray Commonly known as:  FLONASE   Fluticasone-Salmeterol 250-50 MCG/DOSE Aepb Commonly known as:  ADVAIR   LORazepam 1 MG tablet Commonly known  as:  ATIVAN   multivitamin with minerals Tabs tablet   nystatin cream Commonly known as:  MYCOSTATIN   polyethylene glycol packet Commonly known as:  MIRALAX / GLYCOLAX   vitamin B-12 1000 MCG  tablet Commonly known as:  CYANOCOBALAMIN     TAKE these medications   acetaminophen 500 MG tablet Commonly known as:  TYLENOL Take 1,000 mg by mouth 2 (two) times daily as needed for mild pain or headache.   VISINE 0.05 % ophthalmic solution Generic drug:  tetrahydrozoline Place 1 drop into both eyes as needed (for dry eyes).            Durable Medical Equipment        Start     Ordered   08/02/16 0044  For home use only DME Air overlay mattress  Once     08/02/16 0043      Allergies  Allergen Reactions  . Ciprofloxacin Hives  . Sulfa Antibiotics Anaphylaxis    Consultations:  General Surgery  Palliative Care    Procedures/Studies: Dg Chest 2 View  Result Date: 08/01/2016 CLINICAL DATA:  Pt BIB EMS. She is from home. Her daughter reports she has had a productive cough since this morning. She has a hx of cognitive disorder. EMS reports she is being treated for a sacral and decubitus ulcer. She has hx of DM. Blood glucose 340 per EMS. She is not on any DM medication. She is a full code. No acute distress. Ex-smoker, asthma, diabetic, htn EXAM: CHEST  2 VIEW COMPARISON:  07/02/2016 FINDINGS: Cardiac silhouette is normal in size. No mediastinal or hilar masses or convincing adenopathy. Clear lungs.  No pleural effusion or pneumothorax. IMPRESSION: No acute cardiopulmonary disease. Electronically Signed   By: Wendy Portlandavid  Vasquez M.D.   On: 08/01/2016 19:48      Discharge Exam: Vitals:   08/06/16 2043 08/07/16 0511  BP: 114/63 107/64  Pulse: 74 78  Resp: 12 18  Temp: 97.6 F (36.4 C) 98.1 F (36.7 C)   Vitals:   08/06/16 0521 08/06/16 1333 08/06/16 2043 08/07/16 0511  BP: 107/63 120/65 114/63 107/64  Pulse: 96 80 74 78  Resp: 20 18 12 18   Temp: 98.6 F (37 C) 98.8 F (37.1 C) 97.6 F (36.4 C) 98.1 F (36.7 C)  TempSrc: Axillary Axillary Axillary Axillary  SpO2: 96% 96% 97% 97%  Weight:      Height:        General: Pt is not alert, not interactive, does  not respond to voice or physical stimuli  Cardiovascular: RRR, S1/S2 +, no rubs, no gallops Respiratory: CTA bilaterally, no wheezing, no rhonchi Abdominal: Soft, NT, ND, bowel sounds + Extremities: no edema, no cyanosis    The results of significant diagnostics from this hospitalization (including imaging, microbiology, ancillary and laboratory) are listed below for reference.     Microbiology: Recent Results (from the past 240 hour(s))  Culture, blood (Routine X 2) w Reflex to ID Panel     Status: None (Preliminary result)   Collection Time: 08/01/16  8:40 PM  Result Value Ref Range Status   Specimen Description BLOOD RIGHT FOREARM  Final   Special Requests BOTTLES DRAWN AEROBIC AND ANAEROBIC 5ML  Final   Culture   Final    NO GROWTH 4 DAYS Performed at [facility], [address] [place], [place] [address]    Report Status PENDING  Incomplete  Culture, blood (Routine X 2) w Reflex to ID Panel  Status: None (Preliminary result)   Collection Time: 08/01/16  8:40 PM  Result Value Ref Range Status   Specimen Description BLOOD RIGHT HAND  Final   Special Requests BOTTLES DRAWN AEROBIC AND ANAEROBIC  Final   Culture   Final    NO GROWTH 4 DAYS Performed at [facility], [address], [place], [place] [address]    Report Status PENDING  Incomplete  Respiratory Panel by PCR     Status: None   Collection Time: 08/02/16 12:57 AM  Result Value Ref Range Status   Adenovirus NOT DETECTED NOT DETECTED Final   Coronavirus 229E NOT DETECTED NOT DETECTED Final   Coronavirus HKU1 NOT DETECTED NOT DETECTED Final   Coronavirus NL63 NOT DETECTED NOT DETECTED Final   Coronavirus OC43 NOT DETECTED NOT DETECTED Final   Metapneumovirus NOT DETECTED NOT DETECTED Final   Rhinovirus / Enterovirus NOT DETECTED NOT DETECTED Final   Influenza A NOT DETECTED NOT DETECTED Final   Influenza B NOT DETECTED NOT DETECTED Final   Parainfluenza Virus 1 NOT DETECTED NOT DETECTED Final    Parainfluenza Virus 2 NOT DETECTED NOT DETECTED Final   Parainfluenza Virus 3 NOT DETECTED NOT DETECTED Final   Parainfluenza Virus 4 NOT DETECTED NOT DETECTED Final   Respiratory Syncytial Virus NOT DETECTED NOT DETECTED Final   Bordetella pertussis NOT DETECTED NOT DETECTED Final   Chlamydophila pneumoniae NOT DETECTED NOT DETECTED Final   Mycoplasma pneumoniae NOT DETECTED NOT DETECTED Final    Comment: Performed at [facility]  MRSA PCR Screening     Status: None   Collection Time: 08/02/16  1:30 AM  Result Value Ref Range Status   MRSA by PCR NEGATIVE NEGATIVE Final    Comment:        The GeneXpert MRSA Assay (FDA approved for NASAL specimens only), is one component of a comprehensive MRSA colonization surveillance program. It is not intended to diagnose MRSA infection nor to guide or monitor treatment for MRSA infections.   Urine culture     Status: None   Collection Time: 08/02/16  1:59 AM  Result Value Ref Range Status   Specimen Description URINE, CLEAN CATCH  Final   Special Requests NONE  Final   Culture NO GROWTH Performed at [facility]   Final   Report Status 08/03/2016 FINAL  Final     Labs: BNP (last 3 results) No results for input(s): BNP in the last 8760 hours. Basic Metabolic Panel:  Recent Labs Lab 08/02/16 0242  08/03/16 1950 08/03/16 2247 08/04/16 0104 08/04/16 0607 08/04/16 0937  NA 156*  < > 146* 146* 145 146* 145  K 4.0  < > 3.0* 2.9* 3.0* 3.3* 4.0  CL 126*  < > 119* 120* 120* 119* 119*  CO2 22  < > 18* 18* 18* 17* 17*  GLUCOSE 222*  < > 160* 159* 143* 103* 123*  BUN 74*  < > 37* 37* 36* 35* 33*  CREATININE 0.99  < > 0.96 0.96 0.98 0.93 0.77  CALCIUM 10.2  < > 8.9 8.8* 8.8* 8.9 8.9  MG 2.6*  --   --   --   --   --  2.0  PHOS 2.1*  --   --   --   --   --   --   < > = values in this interval not displayed. Liver Function Tests:  Recent Labs Lab 08/02/16 0242  AST 32  ALT 32  ALKPHOS 83  BILITOT 0.8  PROT 6.7   ALBUMIN 2.9*   No results for input(s): LIPASE, AMYLASE in the last 168 hours. No results for input(s): AMMONIA in the last 168 hours. CBC:  Recent Labs Lab 08/01/16 2039 08/02/16 0242  WBC 18.4* 15.8*  NEUTROABS 15.1* 12.7*  HGB 14.7 12.7  HCT 45.5 41.4  MCV 97.8 101.5*  PLT 256 207   Cardiac Enzymes: No results for input(s): CKTOTAL, CKMB, CKMBINDEX, TROPONINI in the last 168 hours. BNP: Invalid input(s): POCBNP CBG:  Recent Labs Lab 08/03/16 2021 08/03/16 2342 08/04/16 0337 08/04/16 0750 08/04/16 1144  GLUCAP 152* 151* 100* 109* 107*   D-Dimer No results for input(s): DDIMER in the last 72 hours. Hgb A1c No results for input(s): HGBA1C in the last 72 hours. Lipid Profile No results for input(s): CHOL, HDL, LDLCALC, TRIG, CHOLHDL, LDLDIRECT in the last 72 hours. Thyroid function studies No results for input(s): TSH, T4TOTAL, T3FREE, THYROIDAB in the last 72 hours.  Invalid input(s): FREET3 Anemia work up No results for input(s): VITAMINB12, FOLATE, FERRITIN, TIBC, IRON, RETICCTPCT in the last 72 hours. Urinalysis    Component Value Date/Time   COLORURINE YELLOW 08/02/2016 0159   APPEARANCEUR HAZY (A) 08/02/2016 0159   LABSPEC 1.018 08/02/2016 0159   PHURINE 5.0 08/02/2016 0159   GLUCOSEU NEGATIVE 08/02/2016 0159   HGBUR MODERATE (A) 08/02/2016 0159   BILIRUBINUR NEGATIVE 08/02/2016 0159   KETONESUR NEGATIVE 08/02/2016 0159   PROTEINUR 100 (A) 08/02/2016 0159   UROBILINOGEN 0.2 04/23/2014 2053   NITRITE NEGATIVE 08/02/2016 0159   LEUKOCYTESUR LARGE (A) 08/02/2016 0159   Sepsis Labs Invalid input(s): PROCALCITONIN,  WBC,  LACTICIDVEN Microbiology Recent Results (from the past 240 hour(s))  Culture, blood (Routine X 2) w Reflex to ID Panel     Status: None (Preliminary result)   Collection Time: 08/01/16  8:40 PM  Result Value Ref Range Status   Specimen Description BLOOD RIGHT FOREARM  Final   Special Requests BOTTLES DRAWN AEROBIC AND ANAEROBIC   Final   Culture   Final    NO GROWTH 4 DAYS Performed at [facility], [address], [place], [place] [address]    Report Status PENDING  Incomplete  Culture, blood (Routine X 2) w Reflex to ID Panel     Status: None (Preliminary result)   Collection Time: 08/01/16  8:40 PM  Result Value Ref Range Status   Specimen Description BLOOD RIGHT HAND  Final   Special Requests BOTTLES DRAWN AEROBIC AND ANAEROBIC  Final   Culture   Final    NO GROWTH 4 DAYS Performed at [facility], [address] [place], [place] [address]    Report Status PENDING  Incomplete  Respiratory Panel by PCR     Status: None   Collection Time: 08/02/16 12:57 AM  Result Value Ref Range Status   Adenovirus NOT DETECTED NOT DETECTED Final   Coronavirus 229E NOT DETECTED NOT DETECTED Final   Coronavirus HKU1 NOT DETECTED NOT DETECTED Final   Coronavirus NL63 NOT DETECTED NOT DETECTED Final   Coronavirus OC43 NOT DETECTED NOT DETECTED Final   Metapneumovirus NOT DETECTED NOT DETECTED Final   Rhinovirus / Enterovirus NOT DETECTED NOT DETECTED Final   Influenza A NOT DETECTED NOT DETECTED Final   Influenza B NOT DETECTED NOT DETECTED Final   Parainfluenza Virus 1 NOT DETECTED NOT DETECTED Final   Parainfluenza Virus 2 NOT DETECTED NOT DETECTED Final   Parainfluenza Virus 3 NOT DETECTED NOT DETECTED  Final   Parainfluenza Virus 4 NOT DETECTED NOT DETECTED Final   Respiratory Syncytial Virus NOT DETECTED NOT DETECTED Final   Bordetella pertussis NOT DETECTED NOT DETECTED Final   Chlamydophila pneumoniae NOT DETECTED NOT DETECTED Final   Mycoplasma pneumoniae NOT DETECTED NOT DETECTED Final    Comment: Performed at [facility]  MRSA PCR Screening     Status: None   Collection Time: 08/02/16  1:30 AM  Result Value Ref Range Status   MRSA by PCR NEGATIVE NEGATIVE Final    Comment:        The GeneXpert MRSA Assay (FDA approved for NASAL specimens only), is one component of  a comprehensive MRSA colonization surveillance program. It is not intended to diagnose MRSA infection nor to guide or monitor treatment for MRSA infections.   Urine culture     Status: None   Collection Time: 08/02/16  1:59 AM  Result Value Ref Range Status   Specimen Description URINE, CLEAN CATCH  Final   Special Requests NONE  Final   Culture NO GROWTH Performed at [facility]   Final   Report Status 08/03/2016 FINAL  Final     Time coordinating discharge: Over 30 minutes  SIGNED:  Noralee Stain, DO [facility] Pager [phone]  If 7PM-7AM, please contact night-coverage [email or web address] Password [email or web address] 08/07/2016, 7:22 AM

## 2016-08-05 NOTE — Progress Notes (Signed)
PROGRESS NOTE    Wendy Vasquez  WJX:[id number] DOB: 1923/11/22 DOA: 08/01/2016 PCP: Pamelia Hoit, MD     Brief Narrative:  Wendy Vasquez a 83 y.o.femalewith medical history significant of GERD, frequent UTIs, arthritis, dementia, DM 2 diet controlled, who presented with productive cough. Patient has known history of sacral decubitus which at first was on a quarter size but has progressed to baseball size. Patient lives at home with her daughter. At her baseline patient is nonverbal and nonambulatory. She is only able to make eye contact patient herself unable to provide any history. Reports patient had history of pneumonia last year that she required hospitalization. She has recently started on Keflex because she had strong smelling urine. Patient's son in law has been diagnosed with flu 2 days ago. Patient was started on broad spectrum antibiotics and was found to be hypernatremic- likely from volume depletion and sodium wasting. She was given IVF to help bring down sodium level. Patient was seen by SLP who found severe cognitive deficits severe oropharyngeal dysphagia of cognitive etiology. Pt with advanced dementia, unable to follow commands and is nonverbal. Full feeding assistance required throughout PO trials. Patient was found to have a sacral decubitus ulcer that was assessed by WOC and found to be necrotic and malodorous unstageable sacral pressure injury requiring surgical debridement or hydrotherapy administered by PT with conservative sharp wound debridement (CSWD). Wound care states that if MD agrees order Surgical (CCS) consult to begin evaluative process. Discussed with patient's daughter who voices she would like surgery to consult and see what options are. Discussed long term goals of care and that patient is significantly demented, has numerous wounds and is malnourished and cachectic with a significant risk of aspiration. Daughter agreed to palliative care consult. Patient  was eventually transitioned to comfort care, and discharged with hospice care.   Assessment & Plan:   Principal Problem:   Sepsis (HCC) Active Problems:   HTN (hypertension)   HLD (hyperlipidemia)   Asthma   A-fib (HCC)   Physical deconditioning   Pressure ulcer of contiguous region involving back and buttock   Protein-calorie malnutrition, severe   Palliative care by specialist   Pain, generalized   Adult failure to thrive   Hypernatremia Large Sacral Unstageable Pressure Injury measures 9cm x 10cm - POA   All medical therapies stopped and patient transitioned to comfort care and hospice evaluation tomorrow.   DVT prophylaxis: None  Code Status: DNR Family Communication: no family at bedside Disposition Plan: hospice to evaluate patient tomorrow    Consultants:   General Surgery  Palliative Care  Procedures:   None  Antimicrobials:  Anti-infectives    Start     Dose/Rate Route Frequency Ordered Stop   08/03/16 0600  vancomycin (VANCOCIN) 500 mg in sodium chloride 0.9 % 100 mL IVPB  Status:  Discontinued     500 mg 100 mL/hr over 60 Minutes Intravenous Every 24 hours 08/02/16 0232 08/04/16 1352   08/02/16 1000  piperacillin-tazobactam (ZOSYN) IVPB 3.375 g  Status:  Discontinued     3.375 g 12.5 mL/hr over 240 Minutes Intravenous Every 8 hours 08/02/16 0232 08/04/16 1352   08/02/16 0130  vancomycin (VANCOCIN) IVPB 1000 mg/200 mL premix     1,000 mg 200 mL/hr over 60 Minutes Intravenous  Once 08/02/16 0055 08/02/16 0233   08/02/16 0100  piperacillin-tazobactam (ZOSYN) IVPB 3.375 g     3.375 g 100 mL/hr over 30 Minutes Intravenous  Once 08/02/16 0055 08/02/16 0203  Subjective: No acute events.   Objective: Vitals:   08/04/16 0335 08/04/16 1441 08/04/16 2245 08/05/16 0619  BP: (!) 100/47 105/60 (!) 96/55 (!) 111/58  Pulse: 82 81 84 85  Resp: 16 16 20 20   Temp: 100 F (37.8 C) 99.4 F (37.4 C) 97.8 F (36.6 C) 97.6 F (36.4 C)  TempSrc: Axillary  Axillary Oral Oral  SpO2: 99% 99% 98% 98%  Weight:      Height:        Intake/Output Summary (Last 24 hours) at 08/05/16 1116 Last data filed at 08/04/16 2245  Gross per 24 hour  Intake              275 ml  Output              400 ml  Net             -125 ml   Filed Weights   08/02/16 0025  Weight: 50.4 kg (111 lb 1.8 oz)    Examination:  General exam: Appears calm and comfortable, not alert or interactive.  Respiratory system: Clear to auscultation. Respiratory effort normal. Cardiovascular system: S1 & S2 heard, RRR. No JVD, murmurs, rubs, gallops or clicks. No pedal edema. Gastrointestinal system: Abdomen is nondistended, soft and nontender. No organomegaly or masses felt. Normal bowel sounds heard. Extremities: Symmetric Skin: large sacral decub reported   Data Reviewed: I have personally reviewed following labs and imaging studies  CBC:  Recent Labs Lab 08/01/16 2039 08/02/16 0242  WBC 18.4* 15.8*  NEUTROABS 15.1* 12.7*  HGB 14.7 12.7  HCT 45.5 41.4  MCV 97.8 101.5*  PLT 256 207   Basic Metabolic Panel:  Recent Labs Lab 08/02/16 0242  08/03/16 1950 08/03/16 2247 08/04/16 0104 08/04/16 0607 08/04/16 0937  NA 156*  < > 146* 146* 145 146* 145  K 4.0  < > 3.0* 2.9* 3.0* 3.3* 4.0  CL 126*  < > 119* 120* 120* 119* 119*  CO2 22  < > 18* 18* 18* 17* 17*  GLUCOSE 222*  < > 160* 159* 143* 103* 123*  BUN 74*  < > 37* 37* 36* 35* 33*  CREATININE 0.99  < > 0.96 0.96 0.98 0.93 0.77  CALCIUM 10.2  < > 8.9 8.8* 8.8* 8.9 8.9  MG 2.6*  --   --   --   --   --  2.0  PHOS 2.1*  --   --   --   --   --   --   < > = values in this interval not displayed. GFR: Estimated Creatinine Clearance: 37.2 mL/min (by C-G formula based on SCr of 0.77 mg/dL). Liver Function Tests:  Recent Labs Lab 08/02/16 0242  AST 32  ALT 32  ALKPHOS 83  BILITOT 0.8  PROT 6.7  ALBUMIN 2.9*   No results for input(s): LIPASE, AMYLASE in the last 168 hours. No results for input(s): AMMONIA  in the last 168 hours. Coagulation Profile: No results for input(s): INR, PROTIME in the last 168 hours. Cardiac Enzymes: No results for input(s): CKTOTAL, CKMB, CKMBINDEX, TROPONINI in the last 168 hours. BNP (last 3 results) No results for input(s): PROBNP in the last 8760 hours. HbA1C: No results for input(s): HGBA1C in the last 72 hours. CBG:  Recent Labs Lab 08/03/16 2021 08/03/16 2342 08/04/16 0337 08/04/16 0750 08/04/16 1144  GLUCAP 152* 151* 100* 109* 107*   Lipid Profile: No results for input(s): CHOL, HDL, LDLCALC, TRIG, CHOLHDL, LDLDIRECT in the last  72 hours. Thyroid Function Tests: No results for input(s): TSH, T4TOTAL, FREET4, T3FREE, THYROIDAB in the last 72 hours. Anemia Panel: No results for input(s): VITAMINB12, FOLATE, FERRITIN, TIBC, IRON, RETICCTPCT in the last 72 hours. Sepsis Labs:  Recent Labs Lab 08/01/16 2039 08/01/16 2240 08/02/16 0242 08/02/16 0249 08/02/16 0557  PROCALCITON  --   --  0.16  --   --   LATICACIDVEN 2.8* 2.2*  --  2.7* 2.0*    Recent Results (from the past 240 hour(s))  Culture, blood (Routine X 2) w Reflex to ID Panel     Status: None (Preliminary result)   Collection Time: 08/01/16  8:40 PM  Result Value Ref Range Status   Specimen Description BLOOD RIGHT FOREARM  Final   Special Requests BOTTLES DRAWN AEROBIC AND ANAEROBIC  Final   Culture   Final    NO GROWTH 2 DAYS Performed at [facility], [address] [place], [place] [address]    Report Status PENDING  Incomplete  Culture, blood (Routine X 2) w Reflex to ID Panel     Status: None (Preliminary result)   Collection Time: 08/01/16  8:40 PM  Result Value Ref Range Status   Specimen Description BLOOD RIGHT HAND  Final   Special Requests BOTTLES DRAWN AEROBIC AND ANAEROBIC  Final   Culture   Final    NO GROWTH 2 DAYS Performed at [facility], [address] [place], [place] [address]    Report Status PENDING  Incomplete  Respiratory Panel  by PCR     Status: None   Collection Time: 08/02/16 12:57 AM  Result Value Ref Range Status   Adenovirus NOT DETECTED NOT DETECTED Final   Coronavirus 229E NOT DETECTED NOT DETECTED Final   Coronavirus HKU1 NOT DETECTED NOT DETECTED Final   Coronavirus NL63 NOT DETECTED NOT DETECTED Final   Coronavirus OC43 NOT DETECTED NOT DETECTED Final   Metapneumovirus NOT DETECTED NOT DETECTED Final   Rhinovirus / Enterovirus NOT DETECTED NOT DETECTED Final   Influenza A NOT DETECTED NOT DETECTED Final   Influenza B NOT DETECTED NOT DETECTED Final   Parainfluenza Virus 1 NOT DETECTED NOT DETECTED Final   Parainfluenza Virus 2 NOT DETECTED NOT DETECTED Final   Parainfluenza Virus 3 NOT DETECTED NOT DETECTED Final   Parainfluenza Virus 4 NOT DETECTED NOT DETECTED Final   Respiratory Syncytial Virus NOT DETECTED NOT DETECTED Final   Bordetella pertussis NOT DETECTED NOT DETECTED Final   Chlamydophila pneumoniae NOT DETECTED NOT DETECTED Final   Mycoplasma pneumoniae NOT DETECTED NOT DETECTED Final    Comment: Performed at [facility]  MRSA PCR Screening     Status: None   Collection Time: 08/02/16  1:30 AM  Result Value Ref Range Status   MRSA by PCR NEGATIVE NEGATIVE Final    Comment:        The GeneXpert MRSA Assay (FDA approved for NASAL specimens only), is one component of a comprehensive MRSA colonization surveillance program. It is not intended to diagnose MRSA infection nor to guide or monitor treatment for MRSA infections.   Urine culture     Status: None   Collection Time: 08/02/16  1:59 AM  Result Value Ref Range Status   Specimen Description URINE, CLEAN CATCH  Final   Special Requests NONE  Final   Culture NO GROWTH Performed at [facility]   Final   Report Status 08/03/2016 FINAL  Final  Radiology Studies: No results found.    Scheduled Meds: . chlorhexidine  15 mL Mouth Rinse BID  . mouth rinse  15 mL Mouth Rinse q12n4p  . morphine  CONCENTRATE  5 mg Oral Q4H  . sodium chloride flush  3 mL Intravenous Q12H   Continuous Infusions:   LOS: 4 days    Time spent: 40 minutes   Noralee StainJennifer Addam Goeller, DO [facility] [email or web address] Password [email or web address] 08/05/2016, 11:16 AM

## 2016-08-05 NOTE — Progress Notes (Signed)
Nutrition Brief Note  Chart reviewed. RD assessment done on 1/15 with associated note at 1450. Pt on Dysphagia 1 diet with honey-thick liquids.  Palliative Care following pt and last met with her/family yesterday afternoon. Pt now transitioning to comfort care.  No further nutrition interventions warranted at this time.  Please re-consult as needed.     Jarome Matin, MS, RD, LDN, [facility] Inpatient Clinical Dietitian Pager # [phone] After hours/weekend pager # [phone]

## 2016-08-06 MED ORDER — POLYVINYL ALCOHOL 1.4 % OP SOLN
1.0000 [drp] | Freq: Four times a day (QID) | OPHTHALMIC | Status: DC
  Administered 2016-08-07 (×2): 1 [drp] via OPHTHALMIC
  Filled 2016-08-06: qty 15

## 2016-08-06 NOTE — Care Management Note (Signed)
Case Management Note  Patient Details  Name: [name] MRN: [id number] Date of Birth: [birth year]  Subjective/Objective:                    Action/Plan:d/c residential hospice   Expected Discharge Date:  08/06/16               Expected Discharge Plan:  Hospice Medical Facility  In-House Referral:  Clinical Social Work  Discharge planning Services  CM Consult  Post Acute Care Choice:    Choice offered to:     DME Arranged:    DME Agency:     HH Arranged:    HH Agency:     Status of Service:  Completed, signed off  If discussed at [employer] Meetings, dates discussed:    Additional Comments:  Lanier ClamMahabir, Rosaisela Jamroz, RN 08/06/2016, 12:49 PM

## 2016-08-07 LAB — CULTURE, BLOOD (ROUTINE X 2)
CULTURE: NO GROWTH
Culture: NO GROWTH

## 2016-08-07 NOTE — Progress Notes (Signed)
Patient remains medically stable for discharge to hospice care.   Noralee StainJennifer Emile Ringgenberg, DO [facility] [email or web address] Password [email or web address] 08/07/2016, 10:30 AM

## 2016-08-07 NOTE — Progress Notes (Signed)
Report called to Shar[facility]Kim K [facility]. All questions answered. Daughter is aware of transfer. [facility] will transport.

## 2016-08-07 NOTE — Progress Notes (Signed)
[facility] is [facility] to [facility]. Patient's daughter, Babette Relicammy made aware. Discharge packet given to RN, Catie. [facility] called for transport.     Lincoln MaxinKelly Merlyn Bollen, LCSW [facility] Clinical Social Worker cell #: [phone]

## 2016-08-20 DEATH — deceased
